# Patient Record
Sex: Male | Born: 1970
Health system: Southern US, Community
[De-identification: ages and names within clinical notes are randomized; demographics above are authoritative.]

## PROBLEM LIST (undated history)

## (undated) DIAGNOSIS — F329 Major depressive disorder, single episode, unspecified: Secondary | ICD-10-CM

## (undated) DIAGNOSIS — F4310 Post-traumatic stress disorder, unspecified: Secondary | ICD-10-CM

## (undated) DIAGNOSIS — H9313 Tinnitus, bilateral: Secondary | ICD-10-CM

## (undated) DIAGNOSIS — F419 Anxiety disorder, unspecified: Secondary | ICD-10-CM

## (undated) DIAGNOSIS — K219 Gastro-esophageal reflux disease without esophagitis: Secondary | ICD-10-CM

## (undated) DIAGNOSIS — I10 Essential (primary) hypertension: Secondary | ICD-10-CM

## (undated) DIAGNOSIS — G4730 Sleep apnea, unspecified: Secondary | ICD-10-CM

## (undated) DIAGNOSIS — R011 Cardiac murmur, unspecified: Secondary | ICD-10-CM

## (undated) DIAGNOSIS — Z8619 Personal history of other infectious and parasitic diseases: Secondary | ICD-10-CM

## (undated) DIAGNOSIS — F32A Depression, unspecified: Secondary | ICD-10-CM

## (undated) DIAGNOSIS — E785 Hyperlipidemia, unspecified: Secondary | ICD-10-CM

## (undated) HISTORY — DX: Sleep apnea, unspecified: G47.30

## (undated) HISTORY — DX: Major depressive disorder, single episode, unspecified: F32.9

## (undated) HISTORY — DX: Essential (primary) hypertension: I10

## (undated) HISTORY — PX: OTHER SURGICAL HISTORY: SHX169

## (undated) HISTORY — DX: Anxiety disorder, unspecified: F41.9

## (undated) HISTORY — DX: Personal history of other infectious and parasitic diseases: Z86.19

## (undated) HISTORY — DX: Tinnitus, bilateral: H93.13

## (undated) HISTORY — PX: HERNIA REPAIR: SHX51

## (undated) HISTORY — DX: Depression, unspecified: F32.A

## (undated) HISTORY — DX: Cardiac murmur, unspecified: R01.1

## (undated) HISTORY — DX: Gastro-esophageal reflux disease without esophagitis: K21.9

## (undated) HISTORY — DX: Hyperlipidemia, unspecified: E78.5

---

## 2007-05-17 LAB — CONVERTED CEMR LAB: Cholesterol: 266 mg/dL

## 2007-06-11 ENCOUNTER — Encounter: Payer: Self-pay | Admitting: Family Medicine

## 2007-06-13 ENCOUNTER — Ambulatory Visit: Payer: Self-pay | Admitting: Family Medicine

## 2007-06-13 DIAGNOSIS — E785 Hyperlipidemia, unspecified: Secondary | ICD-10-CM | POA: Insufficient documentation

## 2007-06-13 DIAGNOSIS — H9319 Tinnitus, unspecified ear: Secondary | ICD-10-CM | POA: Insufficient documentation

## 2007-06-13 DIAGNOSIS — I10 Essential (primary) hypertension: Secondary | ICD-10-CM | POA: Insufficient documentation

## 2007-06-13 LAB — CONVERTED CEMR LAB
Cholesterol, target level: 200 mg/dL
HDL goal, serum: 40 mg/dL
LDL Goal: 130 mg/dL

## 2008-06-25 ENCOUNTER — Encounter: Payer: Self-pay | Admitting: Family Medicine

## 2008-08-07 ENCOUNTER — Encounter: Payer: Self-pay | Admitting: Family Medicine

## 2008-08-25 ENCOUNTER — Encounter: Payer: Self-pay | Admitting: Family Medicine

## 2008-08-29 ENCOUNTER — Ambulatory Visit: Payer: Self-pay | Admitting: Family Medicine

## 2008-09-09 ENCOUNTER — Ambulatory Visit: Payer: Self-pay | Admitting: Family Medicine

## 2008-09-10 ENCOUNTER — Encounter: Payer: Self-pay | Admitting: Family Medicine

## 2008-09-15 LAB — CONVERTED CEMR LAB
HDL: 36 mg/dL — ABNORMAL LOW (ref >39)
LDL Cholesterol: 189 mg/dL — ABNORMAL HIGH (ref 0–99)
Total CHOL/HDL Ratio: 7.3
Triglycerides: 195 mg/dL — ABNORMAL HIGH (ref <150)
VLDL: 39 mg/dL (ref 0–40)

## 2008-10-20 ENCOUNTER — Telehealth: Payer: Self-pay | Admitting: Family Medicine

## 2008-11-24 ENCOUNTER — Telehealth: Payer: Self-pay | Admitting: Family Medicine

## 2008-12-01 ENCOUNTER — Ambulatory Visit: Payer: Self-pay | Admitting: Family Medicine

## 2008-12-01 DIAGNOSIS — R002 Palpitations: Secondary | ICD-10-CM | POA: Insufficient documentation

## 2008-12-01 DIAGNOSIS — R209 Unspecified disturbances of skin sensation: Secondary | ICD-10-CM | POA: Insufficient documentation

## 2008-12-02 LAB — CONVERTED CEMR LAB
Chloride: 101 meq/L (ref 96–112)
Creatinine, Ser: 1.13 mg/dL (ref 0.40–1.50)
HCT: 45.4 % (ref 39.0–52.0)
Platelets: 353 10*3/uL (ref 150–400)
Potassium: 4.2 meq/L (ref 3.5–5.3)
RDW: 13.3 % (ref 11.5–15.5)
Sodium: 139 meq/L (ref 135–145)

## 2008-12-03 ENCOUNTER — Telehealth: Payer: Self-pay | Admitting: Family Medicine

## 2008-12-31 ENCOUNTER — Telehealth (INDEPENDENT_AMBULATORY_CARE_PROVIDER_SITE_OTHER): Payer: Self-pay | Admitting: *Deleted

## 2009-01-12 ENCOUNTER — Ambulatory Visit: Payer: Self-pay | Admitting: Occupational Medicine

## 2009-01-12 ENCOUNTER — Telehealth (INDEPENDENT_AMBULATORY_CARE_PROVIDER_SITE_OTHER): Payer: Self-pay | Admitting: *Deleted

## 2009-01-12 DIAGNOSIS — M26629 Arthralgia of temporomandibular joint, unspecified side: Secondary | ICD-10-CM | POA: Insufficient documentation

## 2009-01-28 ENCOUNTER — Ambulatory Visit: Payer: Self-pay | Admitting: Family Medicine

## 2010-03-10 ENCOUNTER — Ambulatory Visit (HOSPITAL_COMMUNITY): Payer: Self-pay | Admitting: Psychology

## 2010-03-10 ENCOUNTER — Ambulatory Visit: Payer: Self-pay | Admitting: Family

## 2010-03-10 DIAGNOSIS — F341 Dysthymic disorder: Secondary | ICD-10-CM | POA: Insufficient documentation

## 2010-03-16 ENCOUNTER — Ambulatory Visit (HOSPITAL_COMMUNITY): Payer: Self-pay | Admitting: Psychology

## 2010-03-30 ENCOUNTER — Ambulatory Visit (HOSPITAL_COMMUNITY): Payer: Self-pay | Admitting: Psychology

## 2010-04-06 ENCOUNTER — Ambulatory Visit: Payer: Self-pay | Admitting: Family

## 2010-05-11 ENCOUNTER — Ambulatory Visit: Payer: Self-pay | Admitting: Family

## 2010-10-19 NOTE — Miscellaneous (Signed)
Summary: Controlled Substance Agreement  Controlled Substance Agreement   Imported By: Lanelle Bal 03/17/2010 08:33:04  _____________________________________________________________________  External Attachment:    Type:   Image     Comment:   External Document
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
## 2010-11-05 ENCOUNTER — Encounter: Payer: Self-pay | Admitting: Family Medicine

## 2010-11-05 ENCOUNTER — Ambulatory Visit (INDEPENDENT_AMBULATORY_CARE_PROVIDER_SITE_OTHER): Payer: 59 | Admitting: Family Medicine

## 2010-11-05 DIAGNOSIS — F341 Dysthymic disorder: Secondary | ICD-10-CM

## 2010-11-05 DIAGNOSIS — I10 Essential (primary) hypertension: Secondary | ICD-10-CM

## 2010-11-05 DIAGNOSIS — E785 Hyperlipidemia, unspecified: Secondary | ICD-10-CM

## 2010-11-05 DIAGNOSIS — K429 Umbilical hernia without obstruction or gangrene: Secondary | ICD-10-CM

## 2010-11-09 ENCOUNTER — Encounter: Payer: Self-pay | Admitting: Family Medicine
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
## 2010-12-03 ENCOUNTER — Encounter: Payer: Self-pay | Admitting: Family Medicine

## 2010-12-07 ENCOUNTER — Ambulatory Visit: Payer: 59 | Admitting: Family Medicine

## 2010-12-07 NOTE — Consult Note (Signed)
Summary: [facility]   Imported By: Maryln Gottron 11/30/2010 13:33:39  _____________________________________________________________________  External Attachment:    Type:   Image     Comment:   External Document

## 2010-12-09 ENCOUNTER — Telehealth: Payer: Self-pay | Admitting: Family Medicine

## 2010-12-17 ENCOUNTER — Other Ambulatory Visit (HOSPITAL_COMMUNITY): Payer: 59

## 2010-12-23 ENCOUNTER — Ambulatory Visit (HOSPITAL_COMMUNITY): Admission: RE | Admit: 2010-12-23 | Payer: 59 | Source: Ambulatory Visit | Admitting: Surgery

## 2010-12-31 ENCOUNTER — Encounter: Payer: Self-pay | Admitting: Family Medicine

## 2011-01-26 ENCOUNTER — Ambulatory Visit (HOSPITAL_COMMUNITY)
Admission: RE | Admit: 2011-01-26 | Discharge: 2011-01-26 | Disposition: A | Discharge status: Home / Self Care | Payer: 59 | Source: Ambulatory Visit | Attending: General Surgery | Admitting: General Surgery

## 2011-01-26 ENCOUNTER — Encounter (HOSPITAL_COMMUNITY)
Admission: RE | Admit: 2011-01-26 | Discharge: 2011-01-26 | Disposition: A | Discharge status: Home / Self Care | Payer: 59 | Source: Ambulatory Visit | Attending: General Surgery | Admitting: General Surgery

## 2011-01-26 ENCOUNTER — Other Ambulatory Visit (HOSPITAL_COMMUNITY): Payer: Self-pay | Admitting: General Surgery

## 2011-01-26 DIAGNOSIS — Z01818 Encounter for other preprocedural examination: Secondary | ICD-10-CM | POA: Insufficient documentation

## 2011-01-26 DIAGNOSIS — Z01810 Encounter for preprocedural cardiovascular examination: Secondary | ICD-10-CM | POA: Insufficient documentation

## 2011-01-26 DIAGNOSIS — K429 Umbilical hernia without obstruction or gangrene: Secondary | ICD-10-CM

## 2011-01-26 DIAGNOSIS — Z01812 Encounter for preprocedural laboratory examination: Secondary | ICD-10-CM | POA: Insufficient documentation

## 2011-01-26 DIAGNOSIS — I10 Essential (primary) hypertension: Secondary | ICD-10-CM | POA: Insufficient documentation

## 2011-01-26 LAB — DIFFERENTIAL
Basophils Absolute: 0.1 10*3/uL (ref 0.0–0.1)
Lymphocytes Relative: 35 % (ref 12–46)
Neutro Abs: 3.6 10*3/uL (ref 1.7–7.7)

## 2011-01-26 LAB — CBC
HCT: 44 % (ref 39.0–52.0)
Hemoglobin: 15 g/dL (ref 13.0–17.0)
RBC: 4.87 MIL/uL (ref 4.22–5.81)
WBC: 6.9 10*3/uL (ref 4.0–10.5)

## 2011-01-26 LAB — BASIC METABOLIC PANEL
BUN: 13 mg/dL (ref 6–23)
Calcium: 9.7 mg/dL (ref 8.4–10.5)
GFR calc non Af Amer: >60 mL/min (ref >60)
Glucose, Bld: 114 mg/dL — ABNORMAL HIGH (ref 70–99)
Sodium: 140 mEq/L (ref 135–145)

## 2011-01-31 ENCOUNTER — Observation Stay (HOSPITAL_COMMUNITY)
Admission: RE | Admit: 2011-01-31 | Discharge: 2011-02-02 | DRG: 355 | Disposition: A | Discharge status: Home / Self Care | Payer: 59 | Source: Ambulatory Visit | Attending: General Surgery | Admitting: General Surgery

## 2011-01-31 DIAGNOSIS — E669 Obesity, unspecified: Secondary | ICD-10-CM | POA: Insufficient documentation

## 2011-01-31 DIAGNOSIS — I10 Essential (primary) hypertension: Secondary | ICD-10-CM | POA: Insufficient documentation

## 2011-01-31 DIAGNOSIS — Z01818 Encounter for other preprocedural examination: Secondary | ICD-10-CM | POA: Insufficient documentation

## 2011-01-31 DIAGNOSIS — I517 Cardiomegaly: Secondary | ICD-10-CM | POA: Insufficient documentation

## 2011-01-31 DIAGNOSIS — K429 Umbilical hernia without obstruction or gangrene: Principal | ICD-10-CM | POA: Insufficient documentation

## 2011-01-31 DIAGNOSIS — Z01810 Encounter for preprocedural cardiovascular examination: Secondary | ICD-10-CM | POA: Insufficient documentation

## 2011-01-31 DIAGNOSIS — Z01812 Encounter for preprocedural laboratory examination: Secondary | ICD-10-CM | POA: Insufficient documentation

## 2011-02-02 NOTE — Op Note (Signed)
Aaron [name], CLINGER NO.:  [id number]  MEDICAL RECORD NO.:  [id number]           PATIENT TYPE:  O  LOCATION:  5127                         FACILITY:  [facility]  PHYSICIAN:  Adolph Pollack, M.D.DATE OF BIRTH:  [birth year]  DATE OF PROCEDURE:  01/31/2011 DATE OF DISCHARGE:                              OPERATIVE REPORT   PREOPERATIVE DIAGNOSIS:  Umbilical hernia.  POSTOPERATIVE DIAGNOSIS:  Umbilical hernia.  PROCEDURE:  Laparoscopic umbilical hernia repair with mesh.  SURGEON:  Adolph Pollack, MD  ANESTHESIA:  General.  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  He is obese.  Dr. Michaell Cowing saw him in the office and I saw him as a second opinion.  We both felt that a laparoscopic repair with mesh given his body size would have the best chance of long term good result.  He now presents for that.  Procedure, risks, and aftercare were discussed with him preoperatively.  TECHNIQUE:  He was seen in the holding area, then brought to the operating room, placed supine on the operating table and a general anesthetic was administered.  The hair on the abdominal wall was clipped.  The abdominal wall was widely sterilely prepped and draped.  He was placed in slight reverse Trendelenburg position.  In the left subcostal region local anesthetic consisting of Marcaine was infiltrated into the skin and subcutaneous tissues.  A 5-mm incision was then made and using a 5-mm OptiVu trocar I gained access to the peritoneal cavity and created pneumoperitoneum.  Inspection of the area under the trocar demonstrated no evidence of bleeding or organ injury.  Following this, I placed a 5-mm trocar in the left lower quadrant.  I noted the umbilical hernia with omentum up in it and with some external pressure I was able to reduce the omentum out of the hernia.  I  subsequently dissected the sac free and excised part of that as well.  I then placed an 11-mm trocar in right upper quadrant and a 5-mm trocar in the right lower quadrant.  I measured the periphery of the hernia using a spinal needle and then marked an area 4 cm away from the hernia.  This was approximately 12 x 12 cm area after it had been marked and brought a 12 x 12 cm area of Parietex composite mesh into the field.  Four anchoring sutures of #1 Novafil were placed at the 12, 3, 6, and 9 o'clock positions.  The mesh was then hydrated and inserted into the peritoneal cavity with the smooth side facing the viscera and the rough side facing the abdominal wall.  Stab incisions were made in 12, 3, 6 and 9 o'clock positions around the hernia and the anchoring sutures were pulled up across the fascial bridge and tied down anchoring the mesh to the abdominal wall. Using a spiral tacker, I then placed permanent tacks in the periphery of the mesh further anchoring it to the abdominal wall.  Following this, a four quadrant inspection was performed and there was  no evidence of bleeding or intestinal injury.  I released the CO2 gas and watched the omentum approximated the mesh, then removed the trocars.  All skin incisions were then closed with 4-0 Monocryl subcuticular stitches.  Steri-Strips and sterile dressings were applied.  He tolerated the procedure without any apparent complications and was taken to the recovery room in satisfactory condition.     Adolph Pollack, M.D.     Kari Baars  D:  01/31/2011  T:  01/31/2011  Job:  [id number]  cc:   Nani Gasser, M.D.  Electronically Signed by Avel Peace M.D. on 02/02/2011 07:56:38 AM

## 2011-02-15 NOTE — Discharge Summary (Signed)
  NAMECOWEN, PESQUEIRA NO.:  [id number]  MEDICAL RECORD NO.:  [id number]           PATIENT TYPE:  O  LOCATION:  5127                         FACILITY:  [facility]  PHYSICIAN:  Adolph Pollack, M.D.DATE OF BIRTH:  [birth year]  DATE OF ADMISSION:  01/31/2011 DATE OF DISCHARGE:  02/02/2011                              DISCHARGE SUMMARY   FINAL DIAGNOSIS:  Umbilical hernia.  SECONDARY DIAGNOSES: 1. Obesity. 2. Hypertension.  PROCEDURE:  Laparoscopic umbilical hernia repair with mesh.  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  He was admitted for a laparoscopic repair.  HOSPITAL COURSE:  He underwent laparoscopic repair of umbilical hernia, which contained omentum, and this was reduced.  Postoperatively, in the evening and first day, he had some significant peri-incisional pain and nausea.  I added some Toradol and his pain improved, his nausea improved.  By second postoperative day, he was ambulatory, tolerating a diet, voiding, and ready to be discharged.  Incision/dressings were clean and dry.  DISPOSITION:  Discharged home in satisfactory condition on Feb 02, 2011. He is given activity restrictions, Percocet for pain.  I told to take Advil for some breakthrough pain.  He will continue his home medications.  He will follow up in the office with me in 2-3 weeks.     Adolph Pollack, M.D.     Kari Baars  D:  02/02/2011  T:  02/02/2011  Job:  [id number]  Electronically Signed by Avel Peace M.D. on 02/15/2011 10:46:59 AM

## 2011-03-21 ENCOUNTER — Ambulatory Visit (INDEPENDENT_AMBULATORY_CARE_PROVIDER_SITE_OTHER): Payer: 59 | Admitting: General Surgery

## 2011-03-21 DIAGNOSIS — K429 Umbilical hernia without obstruction or gangrene: Secondary | ICD-10-CM

## 2011-03-21 NOTE — Patient Instructions (Signed)
Resume normal activities.  Return to work in one week.

## 2011-03-21 NOTE — Progress Notes (Signed)
He is here for her second postoperative visit following his laparoscopic repair of an umbilical hernia with mesh Jan 31, 2011.  He still has some soreness at the inferior aspect of the repair and by one the left sided incisions.  Physical exam-abdomen: Soft, obese, incisions clean dry and intact. Hernia repair solid.  Assessment: Status post laparoscopic umbilical hernia repair with mesh Jan 31, 2011. Doing well.  Plan: Start to resume normal activities. Return to work in one week. Return visit p.r.n.

## 2011-11-08 LAB — PULMONARY FUNCTION TEST

## 2011-11-14 ENCOUNTER — Other Ambulatory Visit: Payer: Self-pay | Admitting: *Deleted

## 2011-11-22 ENCOUNTER — Ambulatory Visit: Payer: 59 | Admitting: Family Medicine

## 2011-11-23 ENCOUNTER — Encounter: Payer: Self-pay | Admitting: Family

## 2011-11-23 ENCOUNTER — Ambulatory Visit (INDEPENDENT_AMBULATORY_CARE_PROVIDER_SITE_OTHER): Payer: 59 | Admitting: Family

## 2011-11-23 VITALS — BP 166/96 | HR 86 | Temp 97.8°F | Resp 16 | Wt 279.1 lb

## 2011-11-23 DIAGNOSIS — E782 Mixed hyperlipidemia: Secondary | ICD-10-CM | POA: Insufficient documentation

## 2011-11-23 DIAGNOSIS — R9431 Abnormal electrocardiogram [ECG] [EKG]: Secondary | ICD-10-CM

## 2011-11-23 DIAGNOSIS — R404 Transient alteration of awareness: Secondary | ICD-10-CM

## 2011-11-23 DIAGNOSIS — E669 Obesity, unspecified: Secondary | ICD-10-CM

## 2011-11-23 DIAGNOSIS — I10 Essential (primary) hypertension: Secondary | ICD-10-CM

## 2011-11-23 DIAGNOSIS — R400 Somnolence: Secondary | ICD-10-CM

## 2011-11-23 DIAGNOSIS — E785 Hyperlipidemia, unspecified: Secondary | ICD-10-CM

## 2011-11-23 MED ORDER — SIMVASTATIN 40 MG PO TABS: 40.0000 mg | ORAL_TABLET | Freq: Every day | ORAL | Status: DC

## 2011-11-23 MED ORDER — OLMESARTAN MEDOXOMIL-HCTZ 40-25 MG PO TABS: 1.0000 | ORAL_TABLET | Freq: Every day | ORAL | Status: DC

## 2011-11-23 NOTE — Assessment & Plan Note (Signed)
I suspect sleep apnea given complaints and habitus.  Recommended home sleep study and weight loss.
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
## 2011-11-23 NOTE — Assessment & Plan Note (Signed)
Cholesterol 264 LDL 177, resume statin.  Plan to check FLP and LFT's next visit.  Will also plan to check A1C at that time as his sugar was slightly elevated during his physical.

## 2011-11-23 NOTE — Patient Instructions (Signed)
Please work hard on diet exercise and weight loss.  Follow up in 1 month for appointment.   You will be contacted about your stress test and sleep study.

## 2011-11-23 NOTE — Assessment & Plan Note (Signed)
Deteriorated, resume benicar HCT.

## 2011-11-23 NOTE — Assessment & Plan Note (Signed)
Pt counseled on diet, exercise, weight loss.  Specifically, recommended that he discontinue red meat completely, no mcdonalds, no fried food, more fruits/veggies.

## 2011-11-25 ENCOUNTER — Telehealth: Payer: Self-pay | Admitting: *Deleted

## 2011-11-25 DIAGNOSIS — R9431 Abnormal electrocardiogram [ECG] [EKG]: Secondary | ICD-10-CM

## 2011-11-25 NOTE — Telephone Encounter (Signed)
Received call from pt with concerns about proceeding with stress test. Pt has received recent information re: catheterization vs. Stress testing. Pt is concerned if he has a blockage that he may have an MI during the stress test. Spoke with [name], FNP and advised pt that we will schedule him for a consultation to determine the best course of action. Pt requests to be referred to Dr Winifred Olive at [facility] [phone]. He states his parents see this cardiologist and wants to make sure we refer him as Aaron [name] as his dad is also a pt there.

## 2011-12-02 ENCOUNTER — Encounter: Payer: Self-pay | Admitting: Family

## 2011-12-13 ENCOUNTER — Encounter: Payer: 59 | Admitting: Physician Assistant

## 2011-12-20 ENCOUNTER — Telehealth: Payer: Self-pay | Admitting: *Deleted

## 2011-12-20 NOTE — Telephone Encounter (Signed)
Received call from pt stating he has had dizziness x 1 week and elevated BP. 150-170s systolic and does not remember diastolic readings. Pt denies any new symptoms. Per [name], NP pt should be evaluated in the office. Appt scheduled for tomorrow at 11:15 with Dr Rodena Medin.

## 2011-12-20 NOTE — Telephone Encounter (Signed)
Consultation note received from [facility] with Winifred Olive, MD (cardiologist) and forwarded to Provider for review.

## 2011-12-21 ENCOUNTER — Ambulatory Visit (INDEPENDENT_AMBULATORY_CARE_PROVIDER_SITE_OTHER): Payer: 59 | Admitting: Internal Medicine

## 2011-12-21 ENCOUNTER — Encounter: Payer: Self-pay | Admitting: Internal Medicine

## 2011-12-21 ENCOUNTER — Telehealth: Payer: Self-pay | Admitting: *Deleted

## 2011-12-21 VITALS — BP 136/90 | HR 79 | Temp 98.1°F | Resp 18 | Wt 280.0 lb

## 2011-12-21 DIAGNOSIS — R42 Dizziness and giddiness: Secondary | ICD-10-CM

## 2011-12-21 DIAGNOSIS — I10 Essential (primary) hypertension: Secondary | ICD-10-CM

## 2011-12-21 MED ORDER — MECLIZINE HCL 25 MG PO TABS: 25.0000 mg | ORAL_TABLET | Freq: Three times a day (TID) | ORAL | Status: AC | PRN

## 2011-12-21 MED ORDER — NEBIVOLOL HCL 5 MG PO TABS: 5.0000 mg | ORAL_TABLET | Freq: Every day | ORAL | Status: DC

## 2011-12-21 NOTE — Telephone Encounter (Signed)
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 Per Hughie Closs his recent office note states additional testing may be needed. Form faxed to [phone] and original given to pt.

## 2011-12-21 NOTE — Progress Notes (Signed)
  Subjective:    Patient ID: Aaron [name], male    DOB: 23-Feb-1971, 41 y.o.   MRN: [id number]  HPI Pt presents to clinic for evaluation of multiple medical problems. H/o HTN with progressively suboptimal control. Notes recent sbp range 150-170. Compliant with benicar hct without adverse effect. Notes one week h/o dizziness that typically occurs with head position change. No syncope. Has intermittent sensation of fullness of head and sometimes shooting pains in temples. No associated neurologic deficits. No recent illnesses or infection. Undergoing cardiac w/u at [facility] and was told stress echo nl.  Past Medical History  Diagnosis Date  . Hyperlipidemia   . Hypertension    Past Surgical History  Procedure Date  . Broken fingers     reports that he has quit smoking. He does not have any smokeless tobacco history on file. He reports that he drinks alcohol. He reports that he does not use illicit drugs. family history includes Cancer in his father and sister; Diabetes in his mother; Hyperlipidemia in his father and mother; Hypertension in his father and mother; and Other in his father. No Known Allergies   Review of Systems see hpi     Objective:   Physical Exam  Nursing note and vitals reviewed. Constitutional: He appears well-developed and well-nourished. No distress.  HENT:  Head: Normocephalic and atraumatic.  Right Ear: External ear and ear canal normal. A middle ear effusion is present.  Left Ear: Tympanic membrane, external ear and ear canal normal.  Nose: Nose normal.  Mouth/Throat: Oropharynx is clear and moist. No oropharyngeal exudate.  Eyes: Conjunctivae and EOM are normal. Pupils are equal, round, and reactive to light. No scleral icterus.  Neck: Neck supple. No JVD present. Carotid bruit is not present. No thyromegaly present.  Cardiovascular: Normal rate, regular rhythm and normal heart sounds.  Exam reveals no gallop and no friction rub.   No murmur  heard. Pulmonary/Chest: Effort normal and breath sounds normal. No respiratory distress. He has no wheezes. He has no rales.  Neurological: He is alert. No cranial nerve deficit. Coordination normal.  Skin: Skin is warm and dry. He is not diaphoretic.  Psychiatric: He has a normal mood and affect.          Assessment & Plan:

## 2011-12-21 NOTE — Assessment & Plan Note (Signed)
Vestibular etiology suspected. Reproduced in clinic by lateral head turning and associated with mild right ear effusion. Attempt antivert prn. Followup if no improvement or worsening.

## 2011-12-21 NOTE — Assessment & Plan Note (Signed)
suboptimal control. Add samples of bystolic 5mg  po qd. Monitor bp as outpt and keep close f/u appt in one week

## 2011-12-28 ENCOUNTER — Telehealth: Payer: Self-pay | Admitting: *Deleted

## 2011-12-28 ENCOUNTER — Ambulatory Visit: Payer: 59 | Admitting: Family

## 2011-12-28 NOTE — Telephone Encounter (Signed)
Patient returned phone call, and left voice message stating his blood pressure was 112/56, 107/62 in his left arm and 118/60 in his right arm. He has requested a return phone call. Call was returned to patient at [phone], no answer, a voice message was left informing patient call was returned.

## 2011-12-28 NOTE — Telephone Encounter (Signed)
Received voice message from pt stating he experienced some visual disturbance today after a training exercise at work. Reports that his distance vision was white and fading but could seed normal up close. States this is the 2nd episode that he has had. Pt wondering if this could be side effect of medication? Attempted to reach pt for additional details and left message to return my call. Pt has f/u scheduled for 12/30/11 at 9:45am.
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
## 2011-12-30 ENCOUNTER — Encounter: Payer: Self-pay | Admitting: Family

## 2011-12-30 ENCOUNTER — Ambulatory Visit (INDEPENDENT_AMBULATORY_CARE_PROVIDER_SITE_OTHER): Payer: 59 | Admitting: Family

## 2011-12-30 DIAGNOSIS — R739 Hyperglycemia, unspecified: Secondary | ICD-10-CM

## 2011-12-30 DIAGNOSIS — E669 Obesity, unspecified: Secondary | ICD-10-CM

## 2011-12-30 DIAGNOSIS — Z8669 Personal history of other diseases of the nervous system and sense organs: Secondary | ICD-10-CM

## 2011-12-30 DIAGNOSIS — R400 Somnolence: Secondary | ICD-10-CM

## 2011-12-30 DIAGNOSIS — R5381 Other malaise: Secondary | ICD-10-CM

## 2011-12-30 DIAGNOSIS — R5383 Other fatigue: Secondary | ICD-10-CM

## 2011-12-30 DIAGNOSIS — R7309 Other abnormal glucose: Secondary | ICD-10-CM

## 2011-12-30 DIAGNOSIS — E785 Hyperlipidemia, unspecified: Secondary | ICD-10-CM

## 2011-12-30 DIAGNOSIS — R42 Dizziness and giddiness: Secondary | ICD-10-CM

## 2011-12-30 DIAGNOSIS — R404 Transient alteration of awareness: Secondary | ICD-10-CM

## 2011-12-30 DIAGNOSIS — R9431 Abnormal electrocardiogram [ECG] [EKG]: Secondary | ICD-10-CM

## 2011-12-30 LAB — LIPID PANEL
Cholesterol: 200 mg/dL (ref 0–200)
Triglycerides: 185 mg/dL — ABNORMAL HIGH (ref <150)

## 2011-12-30 LAB — TSH: TSH: 1.384 u[IU]/mL (ref 0.350–4.500)
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
## 2011-12-30 NOTE — Patient Instructions (Signed)
Please complete your blood work prior to leaving. Follow up in 3 months, sooner if problems/concerns.  

## 2011-12-30 NOTE — Assessment & Plan Note (Signed)
Pt is fasting, obtain flp.  

## 2011-12-30 NOTE — Assessment & Plan Note (Signed)
ED thought that this was vasovagal response.  It occurred during extreme exertion and heat.  At this point, I think that we have ruled out any serious etiology.  I advised him to keep his upcoming eye examination and let us know if symptoms worsen.

## 2011-12-30 NOTE — Assessment & Plan Note (Signed)
?  mild vertigo, seems improved.  Continue prn meclizine. Monitor.

## 2011-12-30 NOTE — Assessment & Plan Note (Signed)
Suspect sleep apnea, await

## 2011-12-30 NOTE — Assessment & Plan Note (Signed)
Pt has undergone cardiac work which is reportedly normal.  We have requested records from cardiology.  He has been cleared by cards to return to full duty at work.

## 2011-12-30 NOTE — Assessment & Plan Note (Signed)
We discussed the importance of weight loss, diet and exercise. 

## 2012-01-02 ENCOUNTER — Telehealth: Payer: Self-pay | Admitting: Family

## 2012-01-02 DIAGNOSIS — R739 Hyperglycemia, unspecified: Secondary | ICD-10-CM

## 2012-01-02 DIAGNOSIS — E291 Testicular hypofunction: Secondary | ICD-10-CM

## 2012-01-02 LAB — TESTOSTERONE, FREE, TOTAL, SHBG
Testosterone, Free: 51 pg/mL (ref 47.0–244.0)
Testosterone-% Free: 2 % (ref 1.6–2.9)
Testosterone: 254.26 ng/dL — ABNORMAL LOW (ref 300–890)

## 2012-01-02 NOTE — Telephone Encounter (Signed)
Pt notified. Future lab order placed for tomorrow morning and copy forwarded to the lab.

## 2012-01-02 NOTE — Telephone Encounter (Signed)
Pls call pt and let him know that his testosterone is low.  I would like for him to return to the lab for the following tests:  FSH, LH, PSA, TSH, serum cortisol- hypogonadism. We will discuss treatment options after I see these additional tests.  Also, please let him know that his sugar was slightly elevated, but not in diabetic range.  He should work on diet, exercise, weight loss as we discussed. Cholesterol is much better than last time.

## 2012-01-03 ENCOUNTER — Telehealth: Payer: Self-pay

## 2012-01-03 NOTE — Telephone Encounter (Signed)
Called, spoke with pt who reports he is actually in , [facility], office now.  She is helping him with this.  Nothing further needed at this time.

## 2012-01-04 ENCOUNTER — Telehealth: Payer: Self-pay | Admitting: Family

## 2012-01-04 DIAGNOSIS — E291 Testicular hypofunction: Secondary | ICD-10-CM

## 2012-01-04 NOTE — Telephone Encounter (Signed)
Patient would like testosterone results.

## 2012-01-04 NOTE — Telephone Encounter (Signed)
Spoke with allison at [facility] lab and verified receipt of specimens from 01/03/12 and they have resulted. Requested that results be faxed to [place] as they were still showing as future.

## 2012-01-04 NOTE — Telephone Encounter (Signed)
Received results from [facility] and forwarded to Provider for review. Please advise.

## 2012-01-04 NOTE — Telephone Encounter (Signed)
Notified pt and he is agreeable to proceed with referral. 

## 2012-01-04 NOTE — Telephone Encounter (Signed)
Pls call pt and let him know that I have reviewed his additional laboratories- which include a TSH and a PSA which are normal.  I would like to refer him to urology for management of his low testosterone.  Referral pended below.

## 2012-01-05 ENCOUNTER — Telehealth: Payer: Self-pay | Admitting: *Deleted

## 2012-01-05 NOTE — Telephone Encounter (Signed)
Received call from Dr Ellin Goodie office stating they have arrange urology consult for May. They just received our fax but cannot read it. They are requesting that we mail information to them. Referral and labs mailed to [address] [place].

## 2012-01-06 ENCOUNTER — Ambulatory Visit (INDEPENDENT_AMBULATORY_CARE_PROVIDER_SITE_OTHER): Payer: 59 | Admitting: Pulmonary Disease

## 2012-01-06 DIAGNOSIS — G4733 Obstructive sleep apnea (adult) (pediatric): Secondary | ICD-10-CM

## 2012-01-06 DIAGNOSIS — R400 Somnolence: Secondary | ICD-10-CM

## 2012-01-09 ENCOUNTER — Telehealth: Payer: Self-pay | Admitting: Family

## 2012-01-09 DIAGNOSIS — G4730 Sleep apnea, unspecified: Secondary | ICD-10-CM

## 2012-01-09 NOTE — Telephone Encounter (Signed)
Pls call pt and let him know that I have reviewed his sleep study and it shows mild sleep apnea.  I would like for him to see Dr. Vassie Loll- pulmonology for further evaluation/recommendations. (pended below)

## 2012-01-09 NOTE — Telephone Encounter (Signed)
Notified pt and he is agreeable to proceed with referral. Advised pt he will be contacted in the next few days with appt date/time.

## 2012-01-13 ENCOUNTER — Encounter: Payer: Self-pay | Admitting: Family

## 2012-02-14 ENCOUNTER — Institutional Professional Consult (permissible substitution): Payer: 59 | Admitting: Pulmonary Disease

## 2012-02-28 ENCOUNTER — Other Ambulatory Visit: Payer: Self-pay | Admitting: Family

## 2012-02-29 NOTE — Telephone Encounter (Signed)
Rx refill sent to pharmacy. 

## 2012-03-08 ENCOUNTER — Encounter: Payer: Self-pay | Admitting: Internal Medicine

## 2012-03-08 ENCOUNTER — Ambulatory Visit (INDEPENDENT_AMBULATORY_CARE_PROVIDER_SITE_OTHER): Payer: 59 | Admitting: Internal Medicine

## 2012-03-08 VITALS — BP 136/80 | HR 91 | Temp 97.9°F | Resp 20

## 2012-03-08 DIAGNOSIS — H9209 Otalgia, unspecified ear: Secondary | ICD-10-CM

## 2012-03-08 MED ORDER — NEBIVOLOL HCL 5 MG PO TABS: 5.0000 mg | ORAL_TABLET | Freq: Every day | ORAL | Status: DC

## 2012-03-08 MED ORDER — SIMVASTATIN 40 MG PO TABS: 40.0000 mg | ORAL_TABLET | Freq: Every day | ORAL | Status: DC

## 2012-03-08 MED ORDER — FLUTICASONE PROPIONATE 50 MCG/ACT NA SUSP: 2.0000 | Freq: Every day | NASAL | Status: DC

## 2012-03-10 DIAGNOSIS — H9209 Otalgia, unspecified ear: Secondary | ICD-10-CM | POA: Insufficient documentation

## 2012-03-10 NOTE — Progress Notes (Signed)
  Subjective:    Patient ID: Aaron [name], male    DOB: 01-28-1971, 41 y.o.   MRN: [id number]  HPI Pt presents to clinic for evaluation of ear pain. Notes one month h/o left ear pain. Has sporadic stabbing shooting pain in ear. Sometimes pain radiates diagonally towards neck. No drainage, fever, chills, or change in hearing. No alleviating or exacerbating factors.   Past Medical History  Diagnosis Date  . Hyperlipidemia   . Hypertension    Past Surgical History  Procedure Date  . Broken fingers     reports that he has quit smoking. He has never used smokeless tobacco. He reports that he drinks alcohol. He reports that he does not use illicit drugs. family history includes Cancer in his father and sister; Diabetes in his mother; Hyperlipidemia in his father and mother; Hypertension in his father and mother; and Other in his father. No Known Allergies   Review of Systems see hpi     Objective:   Physical Exam  Nursing note and vitals reviewed. Constitutional: He appears well-developed and well-nourished. No distress.  HENT:  Head: Normocephalic and atraumatic.  Right Ear: Tympanic membrane, external ear and ear canal normal.  Left Ear: Tympanic membrane, external ear and ear canal normal.  Nose: Nose normal.  Mouth/Throat: Oropharynx is clear and moist. No oropharyngeal exudate.  Eyes: Conjunctivae are normal. No scleral icterus.  Neurological: He is alert.  Skin: He is not diaphoretic.  Psychiatric: He has a normal mood and affect.          Assessment & Plan:

## 2012-03-10 NOTE — Assessment & Plan Note (Signed)
No exam evidence of infection. Suspect eustacian tube etiology. Attempt short term otc decongestant while monitoring bp. Also begin flonase. Followup if no improvement or worsening.

## 2012-04-02 ENCOUNTER — Ambulatory Visit: Payer: 59 | Admitting: Family

## 2012-04-02 DIAGNOSIS — Z0289 Encounter for other administrative examinations: Secondary | ICD-10-CM

## 2012-12-20 ENCOUNTER — Ambulatory Visit: Payer: 59 | Admitting: Neurology

## 2016-06-22 ENCOUNTER — Telehealth: Payer: Self-pay | Admitting: Behavioral Health

## 2016-06-22 NOTE — Telephone Encounter (Signed)
Unable to reach patient at time of Pre-Visit Call.  Left message for patient to return call when available.    

## 2016-06-23 ENCOUNTER — Encounter: Payer: Self-pay | Admitting: Family Medicine

## 2016-06-23 ENCOUNTER — Ambulatory Visit (INDEPENDENT_AMBULATORY_CARE_PROVIDER_SITE_OTHER): Payer: 59 | Admitting: Family Medicine

## 2016-06-23 VITALS — BP 150/90 | HR 87 | Temp 97.6°F | Ht 72.0 in | Wt 285.4 lb

## 2016-06-23 DIAGNOSIS — Z114 Encounter for screening for human immunodeficiency virus [HIV]: Secondary | ICD-10-CM | POA: Diagnosis not present

## 2016-06-23 DIAGNOSIS — F4310 Post-traumatic stress disorder, unspecified: Secondary | ICD-10-CM | POA: Diagnosis not present

## 2016-06-23 DIAGNOSIS — Z131 Encounter for screening for diabetes mellitus: Secondary | ICD-10-CM

## 2016-06-23 DIAGNOSIS — N529 Male erectile dysfunction, unspecified: Secondary | ICD-10-CM | POA: Diagnosis not present

## 2016-06-23 DIAGNOSIS — E785 Hyperlipidemia, unspecified: Secondary | ICD-10-CM

## 2016-06-23 DIAGNOSIS — Z23 Encounter for immunization: Secondary | ICD-10-CM | POA: Diagnosis not present

## 2016-06-23 DIAGNOSIS — I10 Essential (primary) hypertension: Secondary | ICD-10-CM

## 2016-06-23 DIAGNOSIS — K219 Gastro-esophageal reflux disease without esophagitis: Secondary | ICD-10-CM

## 2016-06-23 DIAGNOSIS — E784 Other hyperlipidemia: Secondary | ICD-10-CM

## 2016-06-23 MED ORDER — PANTOPRAZOLE SODIUM 40 MG PO TBEC: 40.0000 mg | DELAYED_RELEASE_TABLET | Freq: Every day | ORAL | 1 refills | Status: DC

## 2016-06-23 MED ORDER — SERTRALINE HCL 50 MG PO TABS: 50.0000 mg | ORAL_TABLET | Freq: Every day | ORAL | 0 refills | Status: DC

## 2016-06-23 MED ORDER — SIMVASTATIN 40 MG PO TABS: 40.0000 mg | ORAL_TABLET | Freq: Every day | ORAL | 1 refills | Status: DC

## 2016-06-23 MED ORDER — OLMESARTAN MEDOXOMIL-HCTZ 40-25 MG PO TABS: 1.0000 | ORAL_TABLET | Freq: Every day | ORAL | 1 refills | Status: DC

## 2016-06-23 MED ORDER — NEBIVOLOL HCL 5 MG PO TABS: 5.0000 mg | ORAL_TABLET | Freq: Every day | ORAL | 1 refills | Status: DC

## 2016-06-23 NOTE — Addendum Note (Signed)
Addended by: Verdie ShireBAYNES, Ariele Vidrio M on: 06/23/2016 04:35 PM   Modules accepted: Orders

## 2016-06-23 NOTE — Progress Notes (Signed)
Pre visit review using our clinic review tool, if applicable. No additional management support is needed unless otherwise documented below in the visit note. 
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
## 2016-06-23 NOTE — Patient Instructions (Signed)
Please consider counseling. The medical literature and evidence-based guidelines support it. Contact [phone] to schedule an appointment or inquire about cost/insurance coverage.  

## 2016-06-24 ENCOUNTER — Ambulatory Visit: Payer: Self-pay | Admitting: Family Medicine

## 2016-06-24 ENCOUNTER — Other Ambulatory Visit: Payer: Self-pay | Admitting: Family Medicine

## 2016-06-24 ENCOUNTER — Other Ambulatory Visit (INDEPENDENT_AMBULATORY_CARE_PROVIDER_SITE_OTHER): Payer: 59

## 2016-06-24 DIAGNOSIS — N529 Male erectile dysfunction, unspecified: Secondary | ICD-10-CM

## 2016-06-24 DIAGNOSIS — I10 Essential (primary) hypertension: Secondary | ICD-10-CM

## 2016-06-24 DIAGNOSIS — E876 Hypokalemia: Secondary | ICD-10-CM

## 2016-06-24 DIAGNOSIS — Z114 Encounter for screening for human immunodeficiency virus [HIV]: Secondary | ICD-10-CM

## 2016-06-24 DIAGNOSIS — Z131 Encounter for screening for diabetes mellitus: Secondary | ICD-10-CM | POA: Diagnosis not present

## 2016-06-24 LAB — MICROALBUMIN / CREATININE URINE RATIO
CREATININE, U: 222.2 mg/dL
MICROALB UR: 2.9 mg/dL — AB (ref 0.0–1.9)
Microalb Creat Ratio: 1.3 mg/g (ref 0.0–30.0)

## 2016-06-24 LAB — COMPREHENSIVE METABOLIC PANEL
ALT: 28 U/L (ref 0–53)
AST: 18 U/L (ref 0–37)
Albumin: 4.2 g/dL (ref 3.5–5.2)
Alkaline Phosphatase: 56 U/L (ref 39–117)
BUN: 16 mg/dL (ref 6–23)
CHLORIDE: 100 meq/L (ref 96–112)
CO2: 28 meq/L (ref 19–32)
CREATININE: 1 mg/dL (ref 0.40–1.50)
Calcium: 9.5 mg/dL (ref 8.4–10.5)
GFR: 86.17 mL/min (ref >60.00)
Glucose, Bld: 116 mg/dL — ABNORMAL HIGH (ref 70–99)
Potassium: 3.4 mEq/L — ABNORMAL LOW (ref 3.5–5.1)
SODIUM: 138 meq/L (ref 135–145)
Total Bilirubin: 0.4 mg/dL (ref 0.2–1.2)
Total Protein: 7.9 g/dL (ref 6.0–8.3)

## 2016-06-24 LAB — TSH: TSH: 1.55 u[IU]/mL (ref 0.35–4.50)

## 2016-06-24 LAB — HIV ANTIBODY (ROUTINE TESTING W REFLEX): HIV 1&2 Ab, 4th Generation: NONREACTIVE

## 2016-06-24 LAB — HEMOGLOBIN A1C: Hgb A1c MFr Bld: 6.1 % (ref 4.6–6.5)

## 2016-06-28 ENCOUNTER — Other Ambulatory Visit (INDEPENDENT_AMBULATORY_CARE_PROVIDER_SITE_OTHER): Payer: 59

## 2016-06-28 ENCOUNTER — Telehealth: Payer: Self-pay | Admitting: Family Medicine

## 2016-06-28 DIAGNOSIS — E876 Hypokalemia: Secondary | ICD-10-CM | POA: Diagnosis not present

## 2016-06-28 DIAGNOSIS — Z0000 Encounter for general adult medical examination without abnormal findings: Secondary | ICD-10-CM

## 2016-06-28 LAB — BASIC METABOLIC PANEL
BUN: 15 mg/dL (ref 6–23)
CHLORIDE: 98 meq/L (ref 96–112)
CO2: 32 mEq/L (ref 19–32)
Calcium: 9.4 mg/dL (ref 8.4–10.5)
Creatinine, Ser: 1.01 mg/dL (ref 0.40–1.50)
GFR: 85.18 mL/min (ref >60.00)
Glucose, Bld: 122 mg/dL — ABNORMAL HIGH (ref 70–99)
POTASSIUM: 3.1 meq/L — AB (ref 3.5–5.1)
SODIUM: 138 meq/L (ref 135–145)

## 2016-06-28 LAB — HEMOGLOBIN A1C: HEMOGLOBIN A1C: 6.1 % (ref 4.6–6.5)

## 2016-06-28 NOTE — Telephone Encounter (Signed)
°  Relation to ZO:XWRUpt:self Call back number:[phone] Pharmacy:[facility] Reason for call: pt states that the wrong bp rx nebivolol (BYSTOLIC) 5 MG tablet  was sent to the pharmacy, pt states he is on olmesartan-hydrochlorothiazide (BENICAR HCT) 40-25 MG tablet and he did not realize it was the wrong ones until his wife had already picked them up. Please advise

## 2016-06-28 NOTE — Telephone Encounter (Signed)
I have reached out to patient to confirm which medication he has received from pharamacy. Pt states he was out and not around his medications and will return call to me once he gets to them.  I also reached out to pharmacy to verify which medications patient did receive. Pharmacy stated pt was given both BP meds (Nebivolol and Benicar "but probably was confused due to the generic name"). Pt also received Sertraline and Simvastatin but did not receive Protonix. When patient calls back I will inform him of this and let him know Protonix is only $10 cost to him.

## 2016-06-28 NOTE — Telephone Encounter (Signed)
LVMOM for patient to return my call into the office. TL/CMA 06/28/16

## 2016-06-29 ENCOUNTER — Other Ambulatory Visit: Payer: Self-pay | Admitting: Family Medicine

## 2016-06-29 ENCOUNTER — Other Ambulatory Visit (INDEPENDENT_AMBULATORY_CARE_PROVIDER_SITE_OTHER): Payer: 59

## 2016-06-29 DIAGNOSIS — E291 Testicular hypofunction: Secondary | ICD-10-CM

## 2016-06-29 DIAGNOSIS — E876 Hypokalemia: Secondary | ICD-10-CM

## 2016-06-29 LAB — TESTOSTERONE: TESTOSTERONE: 274.01 ng/dL — AB (ref 300.00–890.00)

## 2016-06-29 MED ORDER — POTASSIUM CHLORIDE CRYS ER 10 MEQ PO TBCR: 20.0000 meq | EXTENDED_RELEASE_TABLET | Freq: Two times a day (BID) | ORAL | 0 refills | Status: DC

## 2016-06-29 NOTE — Telephone Encounter (Signed)
Pt called back in. He says that he discussed erectile dysfunction in his visit. He would like to know if there is something that he could take (prescribed) that would help.    Pt would like a call back to discuss further.

## 2016-06-30 NOTE — Telephone Encounter (Signed)
LVMOM for pt to return call into the office.

## 2016-07-01 ENCOUNTER — Encounter: Payer: Self-pay | Admitting: Family Medicine

## 2016-07-05 ENCOUNTER — Encounter: Payer: Self-pay | Admitting: Family Medicine

## 2016-07-11 ENCOUNTER — Other Ambulatory Visit (INDEPENDENT_AMBULATORY_CARE_PROVIDER_SITE_OTHER): Payer: 59

## 2016-07-11 DIAGNOSIS — E876 Hypokalemia: Secondary | ICD-10-CM

## 2016-07-11 LAB — BASIC METABOLIC PANEL
BUN: 21 mg/dL (ref 6–23)
CHLORIDE: 101 meq/L (ref 96–112)
CO2: 29 mEq/L (ref 19–32)
CREATININE: 1 mg/dL (ref 0.40–1.50)
Calcium: 9.4 mg/dL (ref 8.4–10.5)
GFR: 86.15 mL/min (ref >60.00)
GLUCOSE: 122 mg/dL — AB (ref 70–99)
POTASSIUM: 3.1 meq/L — AB (ref 3.5–5.1)
Sodium: 139 mEq/L (ref 135–145)

## 2016-07-14 ENCOUNTER — Encounter: Payer: Self-pay | Admitting: Family Medicine

## 2016-07-14 ENCOUNTER — Ambulatory Visit (INDEPENDENT_AMBULATORY_CARE_PROVIDER_SITE_OTHER): Payer: 59 | Admitting: Family Medicine

## 2016-07-14 ENCOUNTER — Other Ambulatory Visit: Payer: Self-pay | Admitting: Family Medicine

## 2016-07-14 VITALS — BP 140/80 | HR 86 | Temp 97.7°F | Ht 72.0 in | Wt 293.2 lb

## 2016-07-14 DIAGNOSIS — E291 Testicular hypofunction: Secondary | ICD-10-CM | POA: Diagnosis not present

## 2016-07-14 DIAGNOSIS — M545 Low back pain: Secondary | ICD-10-CM | POA: Diagnosis not present

## 2016-07-14 DIAGNOSIS — G8929 Other chronic pain: Secondary | ICD-10-CM

## 2016-07-14 DIAGNOSIS — E876 Hypokalemia: Secondary | ICD-10-CM

## 2016-07-14 LAB — PSA: PSA: 0.86 ng/mL (ref 0.10–4.00)

## 2016-07-14 LAB — BASIC METABOLIC PANEL
BUN: 18 mg/dL (ref 6–23)
CALCIUM: 9.4 mg/dL (ref 8.4–10.5)
CO2: 25 meq/L (ref 19–32)
CREATININE: 0.98 mg/dL (ref 0.40–1.50)
Chloride: 104 mEq/L (ref 96–112)
GFR: 88.18 mL/min (ref >60.00)
Glucose, Bld: 139 mg/dL — ABNORMAL HIGH (ref 70–99)
Potassium: 3.9 mEq/L (ref 3.5–5.1)
Sodium: 141 mEq/L (ref 135–145)

## 2016-07-14 LAB — LUTEINIZING HORMONE: LH: 6.71 m[IU]/mL (ref 1.50–9.30)

## 2016-07-14 LAB — MAGNESIUM: Magnesium: 2 mg/dL (ref 1.5–2.5)

## 2016-07-14 MED ORDER — TESTOSTERONE CYPIONATE 100 MG/ML IM SOLN: 100.0000 mg | INTRAMUSCULAR | 0 refills | Status: DC

## 2016-07-14 MED ORDER — DIAZEPAM 5 MG PO TABS: 5.0000 mg | ORAL_TABLET | Freq: Once | ORAL | 0 refills | Status: DC | PRN

## 2016-07-14 NOTE — Progress Notes (Signed)
Faxed to pharmacy 1133am. TL/CMA

## 2016-07-14 NOTE — Patient Instructions (Addendum)
Please consider counseling. The medical literature and evidence-based guidelines support it. Contact [phone] to schedule an appointment or inquire about cost/insurance coverage.  EXERCISES  RANGE OF MOTION (ROM) AND STRETCHING EXERCISES - Low Back Sprain Most people with lower back pain will find that their symptoms get worse with excessive bending forward (flexion) or arching at the lower back (extension). The exercises that will help resolve your symptoms will focus on the opposite motion.  Your physician, physical therapist or athletic trainer will help you determine which exercises will be most helpful to resolve your lower back pain. Do not complete any exercises without first consulting with your caregiver. Discontinue any exercises which make your symptoms worse, until you speak to your caregiver. If you have pain, numbness or tingling which travels down into your buttocks, leg or foot, the goal of the therapy is for these symptoms to move closer to your back and eventually resolve. Sometimes, these leg symptoms will get better, but your lower back pain may worsen. This is often an indication of progress in your rehabilitation. Be very alert to any changes in your symptoms and the activities in which you participated in the 24 hours prior to the change. Sharing this information with your caregiver will allow him or her to most efficiently treat your condition. These exercises may help you when beginning to rehabilitate your injury. Your symptoms may resolve with or without further involvement from your physician, physical therapist or athletic trainer. While completing these exercises, remember:   Restoring tissue flexibility helps normal motion to return to the joints. This allows healthier, less painful movement and activity.  An effective stretch should be held for at least 30 seconds.  A stretch should never be painful. You should only feel a gentle lengthening or release in the  stretched tissue. FLEXION RANGE OF MOTION AND STRETCHING EXERCISES:  STRETCH - Flexion, Single Knee to Chest   Lie on a firm bed or floor with both legs extended in front of you.  Keeping one leg in contact with the floor, bring your opposite knee to your chest. Hold your leg in place by either grabbing behind your thigh or at your knee.  Pull until you feel a gentle stretch in your low back. Hold 15-20 seconds.  Slowly release your grasp and repeat the exercise with the opposite side. Repeat 2 times. Complete this exercise 1-2 times per day.   STRETCH - Flexion, Double Knee to Chest  Lie on a firm bed or floor with both legs extended in front of you.  Keeping one leg in contact with the floor, bring your opposite knee to your chest.  Tense your stomach muscles to support your back and then lift your other knee to your chest. Hold your legs in place by either grabbing behind your thighs or at your knees.  Pull both knees toward your chest until you feel a gentle stretch in your low back. Hold 15-20 seconds.  Tense your stomach muscles and slowly return one leg at a time to the floor. Repeat 2 times. Complete this exercise 1-2 times per day.   STRETCH - Low Trunk Rotation  Lie on a firm bed or floor. Keeping your legs in front of you, bend your knees so they are both pointed toward the ceiling and your feet are flat on the floor.  Extend your arms out to the side. This will stabilize your upper body by keeping your shoulders in contact with the floor.  Gently and slowly drop   both knees together to one side until you feel a gentle stretch in your low back. Hold for 15-20 seconds.  Tense your stomach muscles to support your lower back as you bring your knees back to the starting position. Repeat the exercise to the other side. Repeat 2 times. Complete this exercise 1-2 times per day  EXTENSION RANGE OF MOTION AND FLEXIBILITY EXERCISES:  STRETCH - Extension, Prone on Elbows   Lie  on your stomach on the floor, a bed will be too soft. Place your palms about shoulder width apart and at the height of your head.  Place your elbows under your shoulders. If this is too painful, stack pillows under your chest.  Allow your body to relax so that your hips drop lower and make contact more completely with the floor.  Hold this position for 15-20 seconds.  Slowly return to lying flat on the floor. Repeat 2 times. Complete this exercise 1-2 times per day.   RANGE OF MOTION - Extension, Prone Press Ups  Lie on your stomach on the floor, a bed will be too soft. Place your palms about shoulder width apart and at the height of your head.  Keeping your back as relaxed as possible, slowly straighten your elbows while keeping your hips on the floor. You may adjust the placement of your hands to maximize your comfort. As you gain motion, your hands will come more underneath your shoulders.  Hold this position 15-20 seconds.  Slowly return to lying flat on the floor. Repeat 2 times. Complete this exercise 1-2 times per day.   RANGE OF MOTION- Quadruped, Neutral Spine   Assume a hands and knees position on a firm surface. Keep your hands under your shoulders and your knees under your hips. You may place padding under your knees for comfort.  Drop your head and point your tailbone toward the ground below you. This will round out your lower back like an angry cat. Hold this position for 15-20 seconds.  Slowly lift your head and release your tail bone so that your back sags into a large arch, like an old horse.  Hold this position for 15-20 seconds.  Repeat this until you feel limber in your low back.  Now, find your "sweet spot." This will be the most comfortable position somewhere between the two previous positions. This is your neutral spine. Once you have found this position, tense your stomach muscles to support your low back.  Hold this position for 15-20 seconds. Repeat 2  times. Complete this exercise 1-2 times per day.  STRENGTHENING EXERCISES - Low Back Sprain These exercises may help you when beginning to rehabilitate your injury. These exercises should be done near your "sweet spot." This is the neutral, low-back arch, somewhere between fully rounded and fully arched, that is your least painful position. When performed in this safe range of motion, these exercises can be used for people who have either a flexion or extension based injury. These exercises may resolve your symptoms with or without further involvement from your physician, physical therapist or athletic trainer. While completing these exercises, remember:   Muscles can gain both the endurance and the strength needed for everyday activities through controlled exercises.  Complete these exercises as instructed by your physician, physical therapist or athletic trainer. Increase the resistance and repetitions only as guided.  You may experience muscle soreness or fatigue, but the pain or discomfort you are trying to eliminate should never worsen during these exercises. If this   pain does worsen, stop and make certain you are following the directions exactly. If the pain is still present after adjustments, discontinue the exercise until you can discuss the trouble with your caregiver.  STRENGTHENING - Deep Abdominals, Pelvic Tilt   Lie on a firm bed or floor. Keeping your legs in front of you, bend your knees so they are both pointed toward the ceiling and your feet are flat on the floor.  Tense your lower abdominal muscles to press your low back into the floor. This motion will rotate your pelvis so that your tail bone is scooping upwards rather than pointing at your feet or into the floor. With a gentle tension and even breathing, hold this position for 10-15 seconds. Repeat 2 times. Complete this exercise 1 time per day.   STRENGTHENING - Abdominals, Crunches   Lie on a firm bed or floor. Keeping  your legs in front of you, bend your knees so they are both pointed toward the ceiling and your feet are flat on the floor. Cross your arms over your chest.  Slightly tip your chin down without bending your neck.  Tense your abdominals and slowly lift your trunk high enough to just clear your shoulder blades. Lifting higher can put excessive stress on the lower back and does not further strengthen your abdominal muscles.  Control your return to the starting position. Repeat 2 times. Complete this exercise once every 1-2 days.   STRENGTHENING - Quadruped, Opposite UE/LE Lift   Assume a hands and knees position on a firm surface. Keep your hands under your shoulders and your knees under your hips. You may place padding under your knees for comfort.  Find your neutral spine and gently tense your abdominal muscles so that you can maintain this position. Your shoulders and hips should form a rectangle that is parallel with the floor and is not twisted.  Keeping your trunk steady, lift your right hand no higher than your shoulder and then your left leg no higher than your hip. Make sure you are not holding your breath. Hold this position for 15-20 seconds.  Continuing to keep your abdominal muscles tense and your back steady, slowly return to your starting position. Repeat with the opposite arm and leg. Repeat 2 times. Complete this exercise once every 1-2 days.   STRENGTHENING - Abdominals and Quadriceps, Straight Leg Raise   Lie on a firm bed or floor with both legs extended in front of you.  Keeping one leg in contact with the floor, bend the other knee so that your foot can rest flat on the floor.  Find your neutral spine, and tense your abdominal muscles to maintain your spinal position throughout the exercise.  Slowly lift your straight leg off the floor about 6 inches for a count of 15, making sure to not hold your breath.  Still keeping your neutral spine, slowly lower your leg all  the way to the floor. Repeat this exercise with each leg 2 times. Complete this exercise once every 1-2 days. POSTURE AND BODY MECHANICS CONSIDERATIONS - Low Back Sprain Keeping correct posture when sitting, standing or completing your activities will reduce the stress put on different body tissues, allowing injured tissues a chance to heal and limiting painful experiences. The following are general guidelines for improved posture. Your physician or physical therapist will provide you with any instructions specific to your needs. While reading these guidelines, remember:  The exercises prescribed by your provider will help you have the   flexibility and strength to maintain correct postures.  The correct posture provides the best environment for your joints to work. All of your joints have less wear and tear when properly supported by a spine with good posture. This means you will experience a healthier, less painful body.  Correct posture must be practiced with all of your activities, especially prolonged sitting and standing. Correct posture is as important when doing repetitive low-stress activities (typing) as it is when doing a single heavy-load activity (lifting).  RESTING POSITIONS Consider which positions are most painful for you when choosing a resting position. If you have pain with flexion-based activities (sitting, bending, stooping, squatting), choose a position that allows you to rest in a less flexed posture. You would want to avoid curling into a fetal position on your side. If your pain worsens with extension-based activities (prolonged standing, working overhead), avoid resting in an extended position such as sleeping on your stomach. Most people will find more comfort when they rest with their spine in a more neutral position, neither too rounded nor too arched. Lying on a non-sagging bed on your side with a pillow between your knees, or on your back with a pillow under your knees will  often provide some relief. Keep in mind, being in any one position for a prolonged period of time, no matter how correct your posture, can still lead to stiffness. PROPER SITTING POSTURE In order to minimize stress and discomfort on your spine, you must sit with correct posture. Sitting with good posture should be effortless for a healthy body. Returning to good posture is a gradual process. Many people can work toward this most comfortably by using various supports until they have the flexibility and strength to maintain this posture on their own. When sitting with proper posture, your ears will fall over your shoulders and your shoulders will fall over your hips. You should use the back of the chair to support your upper back. Your lower back will be in a neutral position, just slightly arched. You may place a small pillow or folded towel at the base of your lower back for  support.  When working at a desk, create an environment that supports good, upright posture. Without extra support, muscles tire, which leads to excessive strain on joints and other tissues. Keep these recommendations in mind:  CHAIR:  A chair should be able to slide under your desk when your back makes contact with the back of the chair. This allows you to work closely.  The chair's height should allow your eyes to be level with the upper part of your monitor and your hands to be slightly lower than your elbows.  BODY POSITION  Your feet should make contact with the floor. If this is not possible, use a foot rest.  Keep your ears over your shoulders. This will reduce stress on your neck and low back.  INCORRECT SITTING POSTURES  If you are feeling tired and unable to assume a healthy sitting posture, do not slouch or slump. This puts excessive strain on your back tissues, causing more damage and pain. Healthier options include:  Using more support, like a lumbar pillow.  Switching tasks to something that requires you to  be upright or walking.  Talking a brief walk.  Lying down to rest in a neutral-spine position.  PROLONGED STANDING WHILE SLIGHTLY LEANING FORWARD  When completing a task that requires you to lean forward while standing in one place for a long time, place   either foot up on a stationary 2-4 inch high object to help maintain the best posture. When both feet are on the ground, the lower back tends to lose its slight inward curve. If this curve flattens (or becomes too large), then the back and your other joints will experience too much stress, tire more quickly, and can cause pain.  CORRECT STANDING POSTURES Proper standing posture should be assumed with all daily activities, even if they only take a few moments, like when brushing your teeth. As in sitting, your ears should fall over your shoulders and your shoulders should fall over your hips. You should keep a slight tension in your abdominal muscles to brace your spine. Your tailbone should point down to the ground, not behind your body, resulting in an over-extended swayback posture.   INCORRECT STANDING POSTURES  Common incorrect standing postures include a forward head, locked knees and/or an excessive swayback. WALKING Walk with an upright posture. Your ears, shoulders and hips should all line-up.  PROLONGED ACTIVITY IN A FLEXED POSITION When completing a task that requires you to bend forward at your waist or lean over a low surface, try to find a way to stabilize 3 out of 4 of your limbs. You can place a hand or elbow on your thigh or rest a knee on the surface you are reaching across. This will provide you more stability, so that your muscles do not tire as quickly. By keeping your knees relaxed, or slightly bent, you will also reduce stress across your lower back. CORRECT LIFTING TECHNIQUES  DO :  Assume a wide stance. This will provide you more stability and the opportunity to get as close as possible to the object which you are  lifting.  Tense your abdominals to brace your spine. Bend at the knees and hips. Keeping your back locked in a neutral-spine position, lift using your leg muscles. Lift with your legs, keeping your back straight.  Test the weight of unknown objects before attempting to lift them.  Try to keep your elbows locked down at your sides in order get the best strength from your shoulders when carrying an object.     Always ask for help when lifting heavy or awkward objects. INCORRECT LIFTING TECHNIQUES DO NOT:   Lock your knees when lifting, even if it is a small object.  Bend and twist. Pivot at your feet or move your feet when needing to change directions.  Assume that you can safely pick up even a paperclip without proper posture.     

## 2016-07-14 NOTE — Progress Notes (Signed)
Chief Complaint  Patient presents with  . Follow-up    on [place] level    Subjective: Patient is a 45 y.o. male here for f/u low testosterone.  +hx of this. Not on tx. Rechecked and still low. Used to be on topical gel, did not like it for fear of it getting on his wife. He is having erectile dysfunction and it is affecting his relationship. He was never told why he had low testosterone in the past.   Also mentions low back pain for several years after pulling a 185 lb manikin. He is having some numbness in his L thigh and weakness as well. No loss of bowel/bladder function. He has been seeing a chiropractor who has been doing adjustments and obtained XR's. He was told he has a pinched nerve by this provider.   ROS: MSK: +LBP Neuro: +weakness in L thigh, +numbness over L thigh  Family History  Problem Relation Age of Onset  . Diabetes Mother   . Hyperlipidemia Mother   . Hypertension Mother   . Heart disease Mother   . Cancer Father   . Hyperlipidemia Father   . Hypertension Father   . Other Father     kidney removal  . Heart disease Father   . Kidney disease Father   . Cancer Sister   . Hypertension Sister    Past Medical History:  Diagnosis Date  . Anxiety   . GERD (gastroesophageal reflux disease)   . History of chicken pox   . Hyperlipidemia   . Hypertension   . Tinnitus of both ears    No Known Allergies  Current Outpatient Prescriptions:  .  Ginkgo Biloba Complex 440 MG CAPS, Take 1 capsule by mouth daily., Disp: , Rfl:  .  Multiple Vitamin (MULTIVITAMIN) tablet, Take 1 tablet by mouth daily., Disp: , Rfl:  .  nebivolol (BYSTOLIC) 5 MG tablet, Take 1 tablet (5 mg total) by mouth daily., Disp: 90 tablet, Rfl: 1 .  olmesartan-hydrochlorothiazide (BENICAR HCT) 40-25 MG tablet, Take 1 tablet by mouth daily., Disp: 90 tablet, Rfl: 1 .  pantoprazole (PROTONIX) 40 MG tablet, Take 1 tablet (40 mg total) by mouth daily., Disp: 90 tablet, Rfl: 1 .  potassium chloride  (K-DUR,KLOR-CON) 10 MEQ tablet, Take 2 tablets (20 mEq total) by mouth 2 (two) times daily., Disp: 14 tablet, Rfl: 0 .  sertraline (ZOLOFT) 50 MG tablet, Take 1 tablet (50 mg total) by mouth daily. Take 1/2 a tab daily for the first 2 weeks., Disp: 60 tablet, Rfl: 0 .  simvastatin (ZOCOR) 40 MG tablet, Take 1 tablet (40 mg total) by mouth at bedtime., Disp: 90 tablet, Rfl: 1 .  testosterone cypionate (DEPOTESTOTERONE CYPIONATE) 100 MG/ML injection, Inject 1 mL (100 mg total) into the muscle every 7 (seven) days. For IM use only, Disp: 10 mL, Rfl: 0  Objective: BP 140/80 (BP Location: Left Arm, Patient Position: Sitting, Cuff Size: Large)   Pulse 86   Temp 97.7 F (36.5 C) (Oral)   Ht 6' (1.829 m)   Wt 293 lb 3.2 oz (133 kg)   SpO2 98%   BMI 39.77 kg/m  General: Awake, appears stated age HEENT: MMM, EOMi Heart: RRR, no murmurs, no LE edema, no bruits Lungs: CTAB, no rales, wheezes or rhonchi. No accessory muscle use MSK: TTP over the midline lumbar spine and sacrum. There is notable restriction in his hamstring flexibility b/l. 5/5 strength throughout LE's b/l with the exception of L knee flexion, 4/5.  Neuro:  patellar DTR 1/4 b/l, calcaneal reflex 1/4 b/l wo clonus, neg straight leg and lesegue's Psych: Age appropriate judgment and insight, normal affect and mood  Assessment and Plan: Hypogonadism in male - Plan: LH, FSH, PSA, testosterone cypionate (DEPOTESTOTERONE CYPIONATE) 100 MG/ML injection  Hypokalemia - Plan: Basic Metabolic Panel (BMET), Magnesium  Chronic midline low back pain without sciatica - Plan: MR Lumbar Spine Wo Contrast  Orders as above. Increase exercise and improve diet. Stretches and exercises provided. Given weakness and neurologic signs on L side, will obtain MRI to r/o nerve impingement. If nml, will consider physical therapy vs SI joint injections. He is following up soon for PTSD, will recheck BP there. The patient voiced understanding and agreement to  the plan.  Jilda Rocheicholas Paul [place], DO 07/14/16  7:41 AM

## 2016-07-14 NOTE — Progress Notes (Signed)
Pre visit review using our clinic review tool, if applicable. No additional management support is needed unless otherwise documented below in the visit note. 

## 2016-07-14 NOTE — Progress Notes (Signed)
Rx printed and signed for anxiety for MRI. Please fax/call in to pharmacy. TY.

## 2016-07-16 ENCOUNTER — Ambulatory Visit (HOSPITAL_BASED_OUTPATIENT_CLINIC_OR_DEPARTMENT_OTHER): Payer: 59

## 2016-07-18 LAB — FOLLICLE STIMULATING HORMONE: FSH: 6.4 m[IU]/mL (ref 1.4–18.1)

## 2016-07-21 ENCOUNTER — Other Ambulatory Visit: Payer: Self-pay | Admitting: Family Medicine

## 2016-07-21 ENCOUNTER — Ambulatory Visit (HOSPITAL_BASED_OUTPATIENT_CLINIC_OR_DEPARTMENT_OTHER)
Admission: RE | Admit: 2016-07-21 | Discharge: 2016-07-21 | Disposition: A | Discharge status: Home / Self Care | Payer: 59 | Source: Ambulatory Visit | Attending: Family Medicine | Admitting: Family Medicine

## 2016-07-21 DIAGNOSIS — M545 Low back pain: Secondary | ICD-10-CM

## 2016-07-21 DIAGNOSIS — G8929 Other chronic pain: Secondary | ICD-10-CM

## 2016-07-21 DIAGNOSIS — Z1389 Encounter for screening for other disorder: Secondary | ICD-10-CM

## 2016-07-21 DIAGNOSIS — F4310 Post-traumatic stress disorder, unspecified: Secondary | ICD-10-CM

## 2016-07-21 DIAGNOSIS — Z0189 Encounter for other specified special examinations: Secondary | ICD-10-CM

## 2016-07-21 NOTE — Telephone Encounter (Signed)
Pt was last seen 07/14/2016. Refill rx for Sertraline #50 tablets 0 refills. TL/CMA

## 2016-07-22 ENCOUNTER — Telehealth: Payer: Self-pay | Admitting: Family

## 2016-07-22 MED ORDER — DIAZEPAM 5 MG PO TABS: 5.0000 mg | ORAL_TABLET | Freq: Once | ORAL | 0 refills | Status: DC | PRN

## 2016-07-22 NOTE — Telephone Encounter (Signed)
Refill called in to pharmacy- patient notified.

## 2016-07-23 ENCOUNTER — Ambulatory Visit (HOSPITAL_BASED_OUTPATIENT_CLINIC_OR_DEPARTMENT_OTHER): Payer: 59

## 2016-07-23 ENCOUNTER — Encounter (HOSPITAL_BASED_OUTPATIENT_CLINIC_OR_DEPARTMENT_OTHER): Payer: Self-pay

## 2016-07-30 ENCOUNTER — Ambulatory Visit (HOSPITAL_BASED_OUTPATIENT_CLINIC_OR_DEPARTMENT_OTHER): Admission: RE | Admit: 2016-07-30 | Payer: 59 | Source: Ambulatory Visit

## 2016-08-13 ENCOUNTER — Ambulatory Visit (HOSPITAL_BASED_OUTPATIENT_CLINIC_OR_DEPARTMENT_OTHER): Payer: 59

## 2016-08-13 ENCOUNTER — Encounter (HOSPITAL_BASED_OUTPATIENT_CLINIC_OR_DEPARTMENT_OTHER): Payer: Self-pay

## 2016-09-01 ENCOUNTER — Ambulatory Visit (INDEPENDENT_AMBULATORY_CARE_PROVIDER_SITE_OTHER): Payer: 59 | Admitting: Family Medicine

## 2016-09-01 ENCOUNTER — Encounter: Payer: Self-pay | Admitting: Family Medicine

## 2016-09-01 ENCOUNTER — Telehealth: Payer: Self-pay | Admitting: Family Medicine

## 2016-09-01 VITALS — BP 140/70 | HR 95 | Temp 97.7°F | Ht 72.0 in | Wt 299.2 lb

## 2016-09-01 DIAGNOSIS — F4310 Post-traumatic stress disorder, unspecified: Secondary | ICD-10-CM

## 2016-09-01 DIAGNOSIS — Z9119 Patient's noncompliance with other medical treatment and regimen: Secondary | ICD-10-CM

## 2016-09-01 DIAGNOSIS — H8111 Benign paroxysmal vertigo, right ear: Secondary | ICD-10-CM

## 2016-09-01 DIAGNOSIS — Z91199 Patient's noncompliance with other medical treatment and regimen due to unspecified reason: Secondary | ICD-10-CM

## 2016-09-01 DIAGNOSIS — I10 Essential (primary) hypertension: Secondary | ICD-10-CM | POA: Diagnosis not present

## 2016-09-01 DIAGNOSIS — E291 Testicular hypofunction: Secondary | ICD-10-CM

## 2016-09-01 MED ORDER — HYDROXYZINE HCL 25 MG PO TABS: 25.0000 mg | ORAL_TABLET | Freq: Three times a day (TID) | ORAL | 1 refills | Status: DC | PRN

## 2016-09-01 MED ORDER — TESTOSTERONE CYPIONATE 100 MG/ML IM SOLN: 100.0000 mg | INTRAMUSCULAR | 0 refills | Status: DC

## 2016-09-01 NOTE — Progress Notes (Signed)
Chief Complaint  Patient presents with  . Follow-up    on meds and refills    Subjective Aaron [name] presents for f/u PTSD. Diagnosed 2012. Has tried Zoloft. Has failed Klonopin. He does have panic attacks. No thoughts of harming self or others. No self-medication with alcohol, prescription drugs or illicit drugs.  Morbid obesity Nearing in on 300 lbs. [facility] [place] [facility] [place] was on Belviq that did work well (was down to 225 lbs). He is not exercising routinely. He notes that he is cutting alcohol out of his diet. He will indulge with sweets (ice cream before bed).  Hypertension Patient presents for hypertension follow up. He does not monitor home blood pressures. He is not compliant with medications- on Benicar 40-25 mg daily, stopped taking Bystolic because he did not like the way it made him feel. Patient has these side effects of medication: none with Benicar He is adhering to a healthy diet overall. Exercise: does not exercise routinely  Vertigo After taking cocaine around 1 mo ago, he started having vertigo.  This has happened in the past, but would resolve after 1 week. This has lasted for 1 mo. Feels like room is spinning whenever he rolls to his R side. No neurologic signs.   ROS Psych: No homicidal or suicidal thoughts Neuro: +vertigo  Past Medical History:  Diagnosis Date  . Anxiety   . GERD (gastroesophageal reflux disease)   . History of chicken pox   . Hyperlipidemia   . Hypertension   . Tinnitus of both ears    Family History  Problem Relation Age of Onset  . Diabetes Mother   . Hyperlipidemia Mother   . Hypertension Mother   . Heart disease Mother   . Cancer Father   . Hyperlipidemia Father   . Hypertension Father   . Other Father     kidney removal  . Heart disease Father   . Kidney disease Father   . Cancer Sister   . Hypertension Sister      Medication List       Accurate as of 09/01/16  8:14  AM. Always use your most recent med list.          diazepam 5 MG tablet Commonly known as:  VALIUM Take 1 tablet (5 mg total) by mouth once as needed for anxiety (Take 30 min before procedure. May repeat if not enough.).   Ginkgo Biloba Complex 440 MG Caps Take 1 capsule by mouth daily.   hydrOXYzine 25 MG tablet Commonly known as:  ATARAX/VISTARIL Take 1 tablet (25 mg total) by mouth 3 (three) times daily as needed for anxiety.   meclizine 25 MG tablet Commonly known as:  ANTIVERT Take 25 mg by mouth 3 (three) times daily as needed for dizziness.   multivitamin tablet Take 1 tablet by mouth daily.   olmesartan-hydrochlorothiazide 40-25 MG tablet Commonly known as:  BENICAR HCT Take 1 tablet by mouth daily.   Omega-3 1000 MG Caps Take by mouth. Take 4 g by mouth daily.   pantoprazole 40 MG tablet Commonly known as:  PROTONIX Take 1 tablet (40 mg total) by mouth daily.   sertraline 50 MG tablet Commonly known as:  ZOLOFT TAKE 1/2 TABLET BY MOUTH DAILY FOR THE FIRST 2 WEEKS, THEN TAKE 1 TABLET DAILY.   simvastatin 40 MG tablet Commonly known as:  ZOCOR Take 1 tablet (40 mg total) by mouth at bedtime.   testosterone cypionate 100 MG/ML  injection Commonly known as:  DEPOTESTOTERONE CYPIONATE Inject 1 mL (100 mg total) into the muscle every 7 (seven) days. For IM use only       Exam BP 140/70 (BP Location: Left Arm, Patient Position: Sitting, Cuff Size: Large)   Pulse 95   Temp 97.7 F (36.5 C) (Oral)   Ht 6' (1.829 m)   Wt 299 lb 3.2 oz (135.7 kg)   SpO2 98%   BMI 40.58 kg/m  General:  well developed, well nourished, in no apparent distress Eyes: PERRLA, EOMi Ears: Patent, canals neg, TM's with otosclerosis, otherwise neg Neck: neck supple without adenopathy, thyromegaly, or masses Lungs:  clear to auscultation, breath sounds equal bilaterally, no respiratory distress Cardio:  regular rate and rhythm without murmurs, heart sounds without clicks or rubs, no  LE edema, no bruits Neuro: No cerebellar signs, DTR's 1/4 patellar b/l Psych: well oriented with normal range of affect and age-appropriate judgement/insight, alert and oriented x4.  Assessment and Plan  PTSD (post-traumatic stress disorder) - Plan: hydrOXYzine (ATARAX/VISTARIL) 25 MG tablet  Morbid obesity (HCC)  HYPERTENSION, BENIGN ESSENTIAL  Benign paroxysmal positional vertigo of right ear - Plan: Ambulatory referral to Physical Therapy  Hypogonadism in male - Plan: testosterone cypionate (DEPOTESTOTERONE CYPIONATE) 100 MG/ML injection  Noncompliance  Orders as above. Strongly encouraged to stop using cocaine in future. Add Vistaril. Will push counseling/CBT at next appt if not improving. Needs to be compliant with meds.  Offered referral to nutrition, bariatric surgery, he can go back to weight loss clinic if he wishes. Ok to stop [employer]. If he loses weight, will keep off of 3rd medication. Needs to clean up diet. Vestibular rehab. Refill Antivert. Needs to take meds routinely. F/u in 1 mo. The patient voiced understanding and agreement to the plan.  Jilda Rocheicholas Paul [place], DO 09/01/16 8:14 AM

## 2016-09-01 NOTE — Patient Instructions (Signed)
Take your medicine daily!!

## 2016-09-01 NOTE — Telephone Encounter (Signed)
Caller name: Relationship to patient: Self Can be reached: [phone]  Pharmacy:  [id number] - [place], [place] - [address] [phone] (Phone) [phone] (Fax)     Reason for call: Request refills on pantoprazole (PROTONIX) 40 MG tablet [[id number]][[id number]] meclizine (ANTIVERT) 25 MG tablet [[id number]

## 2016-09-01 NOTE — Progress Notes (Signed)
Pre visit review using our clinic review tool, if applicable. No additional management support is needed unless otherwise documented below in the visit note. 

## 2016-09-03 ENCOUNTER — Ambulatory Visit (HOSPITAL_BASED_OUTPATIENT_CLINIC_OR_DEPARTMENT_OTHER)
Admission: RE | Admit: 2016-09-03 | Discharge: 2016-09-03 | Disposition: A | Discharge status: Home / Self Care | Payer: 59 | Source: Ambulatory Visit | Attending: Family Medicine | Admitting: Family Medicine

## 2016-09-03 DIAGNOSIS — M8938 Hypertrophy of bone, other site: Secondary | ICD-10-CM | POA: Insufficient documentation

## 2016-09-03 DIAGNOSIS — M545 Low back pain: Secondary | ICD-10-CM | POA: Diagnosis not present

## 2016-09-03 DIAGNOSIS — M5136 Other intervertebral disc degeneration, lumbar region: Secondary | ICD-10-CM | POA: Diagnosis not present

## 2016-09-03 DIAGNOSIS — G8929 Other chronic pain: Secondary | ICD-10-CM | POA: Diagnosis not present

## 2016-09-13 ENCOUNTER — Telehealth: Payer: Self-pay | Admitting: Family Medicine

## 2016-09-13 NOTE — Telephone Encounter (Signed)
Relation to ZO:XWRUpt:self Call back number:[phone]   Reason for call:  Patient inquiring about imaging results, patient states back pain is not improving, please advise

## 2016-09-13 NOTE — Telephone Encounter (Signed)
Let pt know nothing serious or surgical at this point. I would like to bring him in to discuss this issue and what his options are. TY.

## 2016-09-13 NOTE — Telephone Encounter (Signed)
Please advise. TL/CMA 

## 2016-09-14 NOTE — Telephone Encounter (Signed)
I have informed pt of results and scheduled patient for follow up. TL/CMA

## 2016-09-16 ENCOUNTER — Ambulatory Visit (INDEPENDENT_AMBULATORY_CARE_PROVIDER_SITE_OTHER): Payer: 59 | Admitting: Family Medicine

## 2016-09-16 ENCOUNTER — Encounter: Payer: Self-pay | Admitting: Family Medicine

## 2016-09-16 VITALS — BP 130/70 | HR 92 | Temp 97.7°F | Ht 72.0 in | Wt 299.8 lb

## 2016-09-16 DIAGNOSIS — K219 Gastro-esophageal reflux disease without esophagitis: Secondary | ICD-10-CM | POA: Diagnosis not present

## 2016-09-16 DIAGNOSIS — M48061 Spinal stenosis, lumbar region without neurogenic claudication: Secondary | ICD-10-CM

## 2016-09-16 DIAGNOSIS — R1032 Left lower quadrant pain: Secondary | ICD-10-CM | POA: Diagnosis not present

## 2016-09-16 MED ORDER — PANTOPRAZOLE SODIUM 40 MG PO TBEC: 40.0000 mg | DELAYED_RELEASE_TABLET | Freq: Every day | ORAL | 1 refills | Status: DC

## 2016-09-16 MED ORDER — MELOXICAM 7.5 MG PO TABS: 7.5000 mg | ORAL_TABLET | Freq: Every day | ORAL | 0 refills | Status: DC

## 2016-09-16 NOTE — Patient Instructions (Signed)
Try MiraLax 1-2 times daily over the next week and see if it affects your pain.  Stretch your hamstrings!  Keep up the good work with your efforts to lose weight!

## 2016-09-16 NOTE — Progress Notes (Signed)
Chief Complaint  Patient presents with  . Follow-up    on back    Subjective: Patient is a 45 y.o. male here for f/u back pain and MRI review.  MRI showed borderline spinal stenosis. He continues to have lower back pain. No red flag signs. He is undergoing steps to lose weight. He has cut down his alcohol intake and is following a meal plan. He does not adhere to a home exercise regimen or stretching program. This is starting to affect his work.  Abd pain Also brought LLQ abd pain that comes and goes. Nothing he notices makes it better or worse. No weight loss, bleeding or nighttime awakenings.  BM's have been normal.  ROS: MSK: +LBP GI: +abd pain  Family History  Problem Relation Age of Onset  . Diabetes Mother   . Hyperlipidemia Mother   . Hypertension Mother   . Heart disease Mother   . Cancer Father   . Hyperlipidemia Father   . Hypertension Father   . Other Father     kidney removal  . Heart disease Father   . Kidney disease Father   . Cancer Sister   . Hypertension Sister    Past Medical History:  Diagnosis Date  . Anxiety   . GERD (gastroesophageal reflux disease)   . History of chicken pox   . Hyperlipidemia   . Hypertension   . Tinnitus of both ears    No Known Allergies  Current Outpatient Prescriptions:  .  BYSTOLIC 5 MG tablet, Take 1 tablet by mouth daily., Disp: , Rfl: 1 .  Ginkgo Biloba Complex 440 MG CAPS, Take 1 capsule by mouth daily., Disp: , Rfl:  .  hydrOXYzine (ATARAX/VISTARIL) 25 MG tablet, Take 1 tablet (25 mg total) by mouth 3 (three) times daily as needed for anxiety., Disp: 30 tablet, Rfl: 1 .  meclizine (ANTIVERT) 25 MG tablet, Take 25 mg by mouth 3 (three) times daily as needed for dizziness., Disp: , Rfl:  .  Multiple Vitamin (MULTIVITAMIN) tablet, Take 1 tablet by mouth daily., Disp: , Rfl:  .  olmesartan-hydrochlorothiazide (BENICAR HCT) 40-25 MG tablet, Take 1 tablet by mouth daily., Disp: 90 tablet, Rfl: 1 .  Omega-3 1000 MG CAPS,  Take by mouth. Take 4 g by mouth daily., Disp: , Rfl:  .  pantoprazole (PROTONIX) 40 MG tablet, Take 1 tablet (40 mg total) by mouth daily., Disp: 90 tablet, Rfl: 1 .  sertraline (ZOLOFT) 50 MG tablet, TAKE 1/2 TABLET BY MOUTH DAILY FOR THE FIRST 2 WEEKS, THEN TAKE 1 TABLET DAILY., Disp: 60 tablet, Rfl: 0 .  simvastatin (ZOCOR) 40 MG tablet, Take 1 tablet (40 mg total) by mouth at bedtime., Disp: 90 tablet, Rfl: 1 .  testosterone cypionate (DEPOTESTOTERONE CYPIONATE) 100 MG/ML injection, Inject 1 mL (100 mg total) into the muscle every 7 (seven) days. For IM use only, Disp: 10 mL, Rfl: 0 .  meloxicam (MOBIC) 7.5 MG tablet, Take 1 tablet (7.5 mg total) by mouth daily., Disp: 30 tablet, Rfl: 0  Objective: BP 130/70 (BP Location: Left Arm, Patient Position: Sitting, Cuff Size: Large)   Pulse 92   Temp 97.7 F (36.5 C) (Oral)   Ht 6' (1.829 m)   Wt 299 lb 12.8 oz (136 kg)   SpO2 97%   BMI 40.66 kg/m  General: Awake, appears stated age HEENT: MMM, EOMi Heart: RRR, no murmurs Lungs: CTAB, no rales, wheezes or rhonchi. No accessory muscle use Abd: BS+, soft, TTP in LLQ, ND,  no masses or organomegaly Psych: Age appropriate judgment and insight, normal affect and mood  Assessment and Plan: Spinal stenosis of lumbar region without neurogenic claudication - Plan: meloxicam (MOBIC) 7.5 MG tablet, Ambulatory referral to Physical Therapy  Gastroesophageal reflux disease, esophagitis presence not specified - Plan: pantoprazole (PROTONIX) 40 MG tablet  Orders as above. Short course of NSAIDs, will be mindful of pt being on Benicar. Encouraged continued efforts with weight loss. Stretch hamstrings. PT for further management. F/u in 1 week if pain is not improved with MiraLax.  The patient voiced understanding and agreement to the plan.  Jilda Rocheicholas Paul [place], DO 09/16/16  8:00 AM

## 2016-09-21 ENCOUNTER — Ambulatory Visit: Payer: 59 | Attending: Family Medicine | Admitting: Physical Therapy

## 2016-09-21 DIAGNOSIS — M5441 Lumbago with sciatica, right side: Secondary | ICD-10-CM | POA: Insufficient documentation

## 2016-09-21 DIAGNOSIS — G8929 Other chronic pain: Secondary | ICD-10-CM | POA: Insufficient documentation

## 2016-09-21 DIAGNOSIS — R29898 Other symptoms and signs involving the musculoskeletal system: Secondary | ICD-10-CM | POA: Insufficient documentation

## 2016-09-21 DIAGNOSIS — R42 Dizziness and giddiness: Secondary | ICD-10-CM | POA: Insufficient documentation

## 2016-09-21 DIAGNOSIS — M5442 Lumbago with sciatica, left side: Secondary | ICD-10-CM | POA: Insufficient documentation

## 2016-09-21 DIAGNOSIS — M6281 Muscle weakness (generalized): Secondary | ICD-10-CM | POA: Insufficient documentation

## 2016-09-29 ENCOUNTER — Ambulatory Visit: Payer: 59 | Admitting: Physical Therapy

## 2016-09-29 DIAGNOSIS — M5442 Lumbago with sciatica, left side: Principal | ICD-10-CM

## 2016-09-29 DIAGNOSIS — R42 Dizziness and giddiness: Secondary | ICD-10-CM | POA: Diagnosis present

## 2016-09-29 DIAGNOSIS — M5441 Lumbago with sciatica, right side: Principal | ICD-10-CM

## 2016-09-29 DIAGNOSIS — R29898 Other symptoms and signs involving the musculoskeletal system: Secondary | ICD-10-CM

## 2016-09-29 DIAGNOSIS — M6281 Muscle weakness (generalized): Secondary | ICD-10-CM

## 2016-09-29 DIAGNOSIS — G8929 Other chronic pain: Secondary | ICD-10-CM

## 2016-09-29 NOTE — Therapy (Addendum)
[facility] [address]  [place], [place], [address] Phone: [phone]   Fax:  [phone]  Physical Therapy Evaluation  Patient Details  Name: Aaron [name] MRN: [id number] Date of Birth: 12-19-1970 Referring Provider: Dr. Riki Sheer  Encounter Date: 09/29/2016      PT End of Session - 09/29/16 0847    Visit Number 1   Number of Visits 12   Date for PT Re-Evaluation 11/11/16   Authorization - Number of Visits 20  per calendar year   PT Start Time 0847   PT Stop Time 0933   PT Time Calculation (min) 46 min   Activity Tolerance Patient tolerated treatment well   Behavior During Therapy [facility] for tasks assessed/performed      Past Medical History:  Diagnosis Date  . Anxiety   . GERD (gastroesophageal reflux disease)   . History of chicken pox   . Hyperlipidemia   . Hypertension   . Tinnitus of both ears     Past Surgical History:  Procedure Laterality Date  . broken fingers    . HERNIA REPAIR      There were no vitals filed for this visit.       Subjective Assessment - 09/29/16 0851    Subjective Pt referred for lumbar spinal stenosis & vertigo.  Vertigo onset 2011, with bad episode right before thanksgiving which has eased off over the past few week, but still a regular issue for pt esp when turning head to right. Pt reports he has to put pressure on head to relieve. As for LBP, pt reports onset in 2013 while performing a drill at work where he was lifting a 85# dummy. Did not report it at the time. Over past 6-12 months, pain has been worsening with new onset of radicular numbess and tingling in B lateral thighs. Pain not limiting his ability to participate in required work drills and limits walking tolerance preventing him from walking 2-3 miles as he normally would with his wife.   Limitations Standing;Walking   How long can you stand comfortably? varies depending on movement; 5 minutes or  less before needing to shift weight.    How long can you walk comfortably? 10-15 minutes brings on back pain and numbness in leg   Diagnostic tests 09/03/16 - Lumbar spine MRI:  1. Congenitally short pedicles; 2. Mild disc degeneration at L4-5 with an extraforaminal fissure on the left. Borderline spinal stenosis without apparent nerve root encroachment; 3. Mild facet hypertrophy, greatest at L4-5.   Patient Stated Goals "feel better" "be more flexible"    Currently in Pain? Yes   Pain Score 0-No pain  worst: 10 best: 0 Avg: depends on activity; can control pain if taking breaks but when pain is present it is typically severe   Pain Location Back   Pain Orientation Left;Right;Lower   Pain Descriptors / Indicators Burning;Stabbing   Pain Type Chronic pain   Pain Radiating Towards numbness through buttocks and into lateral thighs, stops right above the knees.    Pain Onset More than a month ago  worsening over past 6-12 months   Pain Frequency Intermittent   Aggravating Factors  movement in general    Pain Relieving Factors sitting down, rest   Effect of Pain on Daily Activities difficult to do job, which involves moving around [place]; unable to walk with wife 2-3 miles like he used to  [facility] PT Assessment - 09/29/16 0906      Assessment   Medical Diagnosis Spinal Stenosis of Lumbar Region without neurogenic claudication; Veritgo   Referring Provider Dr. Riki Sheer   Onset Date/Surgical Date --  2013   Next MD Visit 10/05/2016   Prior Therapy Chiropractor     Balance Screen   Has the patient fallen in the past 6 months No   Has the patient had a decrease in activity level because of a fear of falling?  Yes  due to vertigo   Is the patient reluctant to leave their home because of a fear of falling?  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; pushing, pulling, squatting   Leisure fishing, walks wih wife, works outs when on shift     Observation/Other Assessments   Focus on Therapeutic Outcomes (FOTO)  Vertigo - 56% (44% limitation)     ROM / Strength   AROM / PROM / Strength AROM;Strength     AROM   Overall AROM Comments ROM restrictions appear to be d/t muscular tightness; no pain reported   AROM Assessment Site Lumbar   Lumbar Flexion hands to knees - tight, but no pain   Lumbar Extension ~80%   Lumbar - Right Side Bend hand to just below lat knee   Lumbar - Left Side Bend hand to just below lat knee   Lumbar - Right Rotation 80%   Lumbar - Left Rotation WNL     Strength   Strength Assessment Site Hip   Right/Left Hip Right;Left   Right Hip Flexion 4-/5   Right Hip Extension 4-/5   Right Hip ABduction 4+/5   Right Hip ADduction 4/5   Left Hip Flexion 4/5   Left Hip Extension 4-/5   Left Hip ABduction 4/5   Left Hip ADduction 4/5     Flexibility   Soft Tissue Assessment /Muscle Length yes   Hamstrings Severe tightness with pain   Quadriceps mod to severe tightness in hip flexors and mod tightness in rectus femoris, R >L   ITB mod tightness, R>L   Piriformis mod B tightness     Palpation   Palpation comment increased QL & paraspinal muscle tension                   [facility] Adult PT Treatment/Exercise - 09/29/16 0906      Exercises   Exercises Lumbar     Lumbar Exercises: Stretches   Passive Hamstring Stretch 30 seconds;1 rep   Passive Hamstring Stretch Limitations supine with strap   Single Knee to Chest Stretch 30 seconds;1 rep   Hip Flexor Stretch 30 seconds;1 rep    Hip Flexor Stretch Limitations mod thomas with strap for RF   ITB Stretch 30 seconds;1 rep   ITB Stretch Limitations supine with strap   Piriformis Stretch 30 seconds;2 reps   Piriformis Stretch Limitations figure 4 with strap & KTOS  PT Education - 09/29/16 0932    Education provided Yes   Education Details PT eval findings, POC & initial stretching HEP   Person(s) Educated Patient   Methods Explanation;Demonstration;Handout   Comprehension Verbalized understanding;Returned demonstration;Need further instruction          PT Short Term Goals - 09/29/16 0847      PT SHORT TERM GOAL #1   Title Independent with initial HEP by 10/14/16   Status New           PT Long Term Goals - 09/29/16 0847      PT LONG TERM GOAL #1   Title Independent with advanced HEP/gym program by 11/11/16   Status New     PT LONG TERM GOAL #2   Title Pt will demonstrate improved LE flexibilty to allow for lumbar ROM WFL by 11/11/16   Status New     PT LONG TERM GOAL #3   Title B proximal LE strength >/= 4+/5 by 11/11/16 for improved tolerance for job performance by 11/11/16   Status New     PT LONG TERM GOAL #4   Title Pt will report ability to perform all routine job tasks with >/= 50% reduction in LBP and no radicular symptoms by 11/11/16   Status New               Plan - 09/29/16 0847    Clinical Impression Statement Ben is a 46 y/o male who was referred to OP PT for vertigo dating back to 2011, with exacerbation around Thanksgiving 2017, and chronic low back pain dating back to 2013, worsening with new onset of radicular numbness and tingling over past 6-12 months. Pt reports vertigo has lessened and back pain is more significantly limiting, therefore focus of today's eval centered on LBP & lumbar radiculopathy and will plan to further assess for vestibular eval on next visit. Pt reports back is intermittent and often up to 10/10 when present, typically after  moving around for work tasks or walking, but unable to give an average value. Recent MRI revealed borderline lumbar stenosis. Assessment reveals significant limitation in lumbar flexion ROM due to tightness, but no pain, and remaining lumbar motions only mildly limited to [facility]. LE flexibility assessment reveals moderate to severe increased muscle tension/tightness in B HS, piriformis, ITB and hip flexors/quads, with increased pain reported with SLR for HS flexibility assessment. Mild proximal LE weakness noted with some restricted hip ROM due to muscle tightness. POC for LBP will focus on improving LE soft tissue pliability along with core/proximal stability training, postural training with emphasis on neutral spine alignment including proper body mechanics with lifting and job tasks, LE strengthening and manual therapy/modalities PRN for pain. May consider mechanical traction if radicular symptoms persist and/or dry needling for pain/increased muscle tension.   Rehab Potential Good   Clinical Impairments Affecting Rehab Potential dual diagnosis of LBP with radicuopathy & veritigo; HTN; obesity   PT Frequency 2x / week   PT Duration 6 weeks   PT Treatment/Interventions Patient/family education;Therapeutic exercise;Neuromuscular re-education;Manual techniques;Taping;Dry needling;Therapeutic activities;Electrical Stimulation;Moist Heat;Cryotherapy;Traction;ADLs/Self Care Home Management   PT Next Visit Plan Complete vertigo/vestibular assessment; Review initial HEP & initiate lumbar stabilization exercises as time allows   Consulted and Agree with Plan of Care Patient      Patient will benefit from skilled therapeutic intervention in order to improve the following deficits and impairments:  Pain, Impaired flexibility, Decreased range of motion, Decreased strength, Increased muscle spasms, Impaired sensation, Decreased activity tolerance,  Difficulty walking, Postural dysfunction  Visit Diagnosis: Chronic  bilateral low back pain with bilateral sciatica  Muscle weakness (generalized)  Other symptoms and signs involving the musculoskeletal system  Dizziness and giddiness     Problem List Patient Active Problem List   Diagnosis Date Noted  . Sleep apnea 01/09/2012  . Hypogonadism male 01/02/2012  . History of blurred vision 12/30/2011  . Dizziness 12/21/2011  . Obesity (BMI 30-39.9) 11/23/2011  . Somnolence 11/23/2011  . Nonspecific abnormal electrocardiogram (ECG) (EKG) 11/23/2011  . UMBILICAL HERNIA 68/15/9470  . ANXIETY DEPRESSION 03/10/2010  . TMJ PAIN 01/12/2009  . PALPITATIONS 12/01/2008  . TINNITUS NOS 06/13/2007  . HYPERTENSION, BENIGN ESSENTIAL 06/13/2007    Percival Spanish, PT, MPT 09/29/2016, 1:44 PM  [facility] [address]  [place], [place], [address] Phone: [phone]   Fax:  [phone]  Name: Aaron [name] MRN: [id number] Date of Birth: 03-07-1971   PHYSICAL THERAPY DISCHARGE SUMMARY  Visits from Start of Care: 1  Current functional level related to goals / functional outcomes:    Pt completed low back eval but failed to return for vertigo eval or any follow-up treatments. Pt has had 3 no shows for scheduled appointments, therefore per [facility] policy, will proceed with discharge from PT.   Remaining deficits:   Unable to assess as pt failed to return after initial eval.   Education / Equipment:   HEP for low back Plan: Patient agrees to discharge.  Patient goals were not met. Patient is being discharged due to not returning since the last visit.  ?????    Percival Spanish, PT, MPT 10/26/16, 9:35 AM  [facility] [address]  [place], [place], [address] Phone: [phone]   Fax:  [phone]

## 2016-10-04 ENCOUNTER — Ambulatory Visit: Payer: 59 | Admitting: Physical Therapy

## 2016-10-05 ENCOUNTER — Ambulatory Visit: Payer: 59 | Admitting: Family Medicine

## 2016-10-05 ENCOUNTER — Ambulatory Visit: Payer: 59 | Admitting: Physical Therapy

## 2016-10-10 ENCOUNTER — Ambulatory Visit: Payer: 59 | Admitting: Physical Therapy

## 2016-10-12 ENCOUNTER — Ambulatory Visit: Payer: 59

## 2016-10-28 ENCOUNTER — Other Ambulatory Visit: Payer: Self-pay | Admitting: Family Medicine

## 2016-10-28 DIAGNOSIS — F4310 Post-traumatic stress disorder, unspecified: Secondary | ICD-10-CM

## 2016-10-31 ENCOUNTER — Telehealth: Payer: Self-pay | Admitting: Family Medicine

## 2016-10-31 ENCOUNTER — Other Ambulatory Visit: Payer: Self-pay | Admitting: Family Medicine

## 2016-10-31 NOTE — Telephone Encounter (Signed)
Please advise. TL/CMA 

## 2016-10-31 NOTE — Telephone Encounter (Signed)
Rx sent to the pharmacy.  [facility] @ [phone]) informing the pt that the prescription has been approved and sent to the pharmacy.//AB/CMA

## 2016-10-31 NOTE — Telephone Encounter (Signed)
Caller name: Relationship to patient: Self Can be reached: [phone] Pharmacy:  [id number] - [place], [place] - [address] [phone] (Phone) [phone] (Fax)     Reason for call: Refill hydrOXYzine (ATARAX/VISTARIL) 25 MG tablet [[id number]][[id number]]

## 2016-11-01 NOTE — Telephone Encounter (Signed)
error:[id number] ° °

## 2016-11-16 ENCOUNTER — Telehealth: Payer: Self-pay | Admitting: Family Medicine

## 2016-11-16 DIAGNOSIS — E291 Testicular hypofunction: Secondary | ICD-10-CM

## 2016-11-16 NOTE — Telephone Encounter (Addendum)
Patient inquiring about medication below, patient states if medication is not sent in today please send tomorrow to  [id number] - , [place] - [address] [phone] (Phone) [phone] (Fax)   Patient would like a follow up call when Rx is sent best # [phone]

## 2016-11-16 NOTE — Telephone Encounter (Signed)
Pharmacy: [facility]    CB: [phone]

## 2016-11-16 NOTE — Telephone Encounter (Signed)
Pt would like a refill on his Testosterone medication, he said that his insurance will cover the 100 MG but not his current dosage. He would like to know if he's able to switch to 100 MG instead?

## 2016-11-17 MED ORDER — TESTOSTERONE CYPIONATE 200 MG/ML IM SOLN: 200.0000 mg | INTRAMUSCULAR | 0 refills | Status: DC

## 2016-11-17 NOTE — Addendum Note (Signed)
Addended by: Scharlene GlossEWING, ROBIN B on: 11/17/2016 02:01 PM   Modules accepted: Orders

## 2016-11-17 NOTE — Telephone Encounter (Signed)
OK. This is given every 2 weeks though. TY.

## 2016-11-17 NOTE — Telephone Encounter (Signed)
Insurance will cover the 200 mg  Is this ok to fill ?? He was not clear in the first message. If send in today send to the pharmacy in [place],  After today send to [facility].

## 2016-11-17 NOTE — Telephone Encounter (Signed)
OK to refill at new dose that insurance will cover. TY.

## 2016-11-17 NOTE — Telephone Encounter (Signed)
advise

## 2016-11-17 NOTE — Telephone Encounter (Signed)
Printed and provider [facility] [place] Patient is aware

## 2016-11-24 ENCOUNTER — Telehealth: Payer: Self-pay

## 2016-11-24 ENCOUNTER — Ambulatory Visit (INDEPENDENT_AMBULATORY_CARE_PROVIDER_SITE_OTHER): Payer: 59 | Admitting: Family Medicine

## 2016-11-24 ENCOUNTER — Encounter: Payer: Self-pay | Admitting: Family Medicine

## 2016-11-24 DIAGNOSIS — F341 Dysthymic disorder: Secondary | ICD-10-CM | POA: Diagnosis not present

## 2016-11-24 DIAGNOSIS — F4310 Post-traumatic stress disorder, unspecified: Secondary | ICD-10-CM

## 2016-11-24 DIAGNOSIS — I10 Essential (primary) hypertension: Secondary | ICD-10-CM | POA: Diagnosis not present

## 2016-11-24 MED ORDER — LORCASERIN HCL ER 20 MG PO TB24: 1.0000 | ORAL_TABLET | Freq: Every day | ORAL | 2 refills | Status: DC

## 2016-11-24 MED ORDER — ALPRAZOLAM 0.5 MG PO TABS: 0.5000 mg | ORAL_TABLET | Freq: Two times a day (BID) | ORAL | 0 refills | Status: DC | PRN

## 2016-11-24 MED ORDER — VALSARTAN-HYDROCHLOROTHIAZIDE 160-25 MG PO TABS: 1.0000 | ORAL_TABLET | Freq: Every day | ORAL | 2 refills | Status: DC

## 2016-11-24 MED ORDER — SERTRALINE HCL 50 MG PO TABS: ORAL_TABLET | ORAL | 0 refills | Status: DC

## 2016-11-24 MED FILL — ALPRAZolam 0.5 MG TABS: 0.5 | 15 days supply | Qty: 30 | Fill #0

## 2016-11-24 NOTE — Progress Notes (Signed)
Chief Complaint  Patient presents with  . Follow-up    1 mos-pt states doing fine    Subjective: Patient is a 46 y.o. male here for medication f/u.  Hypertension Patient presents for hypertension follow up. He does not monitor home blood pressures. He does have access at the fire station He is compliant with medications. Patient has these side effects of medication: none; does note Bystolic and Benicar are expensive and wishes to go back to Diovan HCT. He isnot  adhering to a healthy diet overall. Exercise: starting to life weights and walk on treadmill  Morbid Obesity Pt used to attend wt loss clinic and was on Belviq. Requesting more of this medication as it was helpful to him. He has increased his activity. He has cut down on wine intake, but has started drinking beer. No chest pain or SOB.  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  He was started on Zoloft, but he does not routinely take this.  He will intermittently have panic attacks with seemingly no triggers (has had 2 over past 2 mo), was rx'd Atarax that was not very helpful. Denies SI or HI.  ROS: Heart: Denies chest pain  Lungs: Denies SOB   Family History  Problem Relation Age of Onset  . Diabetes Mother   . Hyperlipidemia Mother   . Hypertension Mother   . Heart disease Mother   . Cancer Father   . Hyperlipidemia Father   . Hypertension Father   . Other Father     kidney removal  . Heart disease Father   . Kidney disease Father   . Cancer Sister   . Hypertension Sister    Past Medical History:  Diagnosis Date  . Anxiety   . GERD (gastroesophageal reflux disease)   . History of chicken pox   . Hyperlipidemia   . Hypertension   . Tinnitus of both ears    No Known Allergies  Current Outpatient Prescriptions:  .  Ginkgo Biloba Complex 440 MG CAPS, Take 1 capsule by mouth daily., Disp: , Rfl:  .  meclizine  (ANTIVERT) 25 MG tablet, Take 25 mg by mouth 3 (three) times daily as needed for dizziness., Disp: , Rfl:  .  Multiple Vitamin (MULTIVITAMIN) tablet, Take 1 tablet by mouth daily., Disp: , Rfl:  .  Omega-3 1000 MG CAPS, Take by mouth. Take 4 g by mouth daily., Disp: , Rfl:  .  pantoprazole (PROTONIX) 40 MG tablet, Take 1 tablet (40 mg total) by mouth daily., Disp: 90 tablet, Rfl: 1 .  sertraline (ZOLOFT) 50 MG tablet, TAKE 1/2 TABLET BY MOUTH DAILY FOR THE FIRST 2 WEEKS, THEN TAKE 1 TABLET DAILY., Disp: 60 tablet, Rfl: 0 .  simvastatin (ZOCOR) 40 MG tablet, Take 1 tablet (40 mg total) by mouth at bedtime., Disp: 90 tablet, Rfl: 1 .  testosterone cypionate (DEPOTESTOSTERONE CYPIONATE) 200 MG/ML injection, Inject 1 mL (200 mg total) into the muscle every 14 (fourteen) days., Disp: 10 mL, Rfl: 0 .  ALPRAZolam (XANAX) 0.5 MG tablet, Take 1 tablet (0.5 mg total) by mouth 2 (two) times daily as needed for anxiety., Disp: 30 tablet, Rfl: 0 .  Lorcaserin HCl ER (BELVIQ XR) 20 MG TB24, Take 1 tablet by mouth daily., Disp: 30 tablet, Rfl: 2 .  meloxicam (MOBIC) 7.5 MG tablet, Take 1 tablet (7.5 mg total) by mouth daily. (Patient not taking: Reported on 11/24/2016),  Disp: 30 tablet, Rfl: 0 .  valsartan-hydrochlorothiazide (DIOVAN HCT) 160-25 MG tablet, Take 1 tablet by mouth daily., Disp: 30 tablet, Rfl: 2  Objective: BP 136/70 (BP Location: Left Arm, Patient Position: Sitting, Cuff Size: Large)   Pulse 85   Temp 97.7 F (36.5 C) (Oral)   Ht 6' (1.829 m)   Wt (!) 300 lb 6.4 oz (136.3 kg)   SpO2 97%   BMI 40.74 kg/m  General: Awake, appears stated age HEENT: MMM, EOMi Heart: RRR, no murmurs, no bruits, no LE edema=ma Lungs: CTAB, no rales, wheezes or rhonchi. No accessory muscle use Psych: Age appropriate judgment and insight, normal affect and mood  Assessment and Plan: Morbid obesity (HCC) - Plan: Lorcaserin HCl ER (BELVIQ XR) 20 MG TB24  ANXIETY DEPRESSION - Plan: ALPRAZolam (XANAX) 0.5 MG  tablet, sertraline (ZOLOFT) 50 MG tablet  HYPERTENSION, BENIGN ESSENTIAL - Plan: sertraline (ZOLOFT) 50 MG tablet, valsartan-hydrochlorothiazide (DIOVAN HCT) 160-25 MG tablet  PTSD (post-traumatic stress disorder) - Plan: sertraline (ZOLOFT) 50 MG tablet  Orders as above. I want him to check his BP 2-3 times per week at station and keep log to bring to next appt.  Counseled on diet and exercise. Will start Belviq. If he has not lost 5% of TBW by 12 weeks, will D/C. Xanax for use during panic attacks only, does not appear this will be frequent. I would like him to take Zoloft routinely. Letter for work provided at pt request stating that he is being rx'd Xanax from our office. Fu in 6 weeks to recheck BP, mood and weight loss efforts. The patient voiced understanding and agreement to the plan.  Jilda Rocheicholas Paul Van WyckWendling, DO 11/24/16  8:55 AM

## 2016-11-24 NOTE — Telephone Encounter (Signed)
Received PA denial. Pt's insurance does not cover weight loss medications, these are plan exclusions. Will send to PCP for FYI.

## 2016-11-24 NOTE — Telephone Encounter (Signed)
Informed the pt of the below message.  Pt verbalized understanding.  Pt [facility] [place]  Per Dr. [name] okay [facility].  [facility] [place].  Confirmation received.//AB/CMA

## 2016-11-24 NOTE — Telephone Encounter (Signed)
Let pt know we can rx, but insurance will not cover despite our PA attempt. If too costly, D/C from his med list. TY.

## 2016-11-24 NOTE — Patient Instructions (Addendum)
Around 2 times per week, check your blood pressure. Write down these values and bring them to your next nurse visit/appointment.  When you check your BP, make sure you have been doing something calm/relaxing 5 minutes prior to checking. Both feet should be flat on the floor and you should be sitting. Use your left arm and make sure it is in a relaxed position (on a table), and that the cuff is at the approximate level/height of your heart.  Healthy Eating Plan Many factors influence your heart health, including eating and exercise habits. Heart (coronary) risk increases with abnormal blood fat (lipid) levels. Heart-healthy meal planning includes limiting unhealthy fats, increasing healthy fats, and making other small dietary changes. This includes maintaining a healthy body weight to help keep lipid levels within a normal range.  WHAT IS MY PLAN?  Your health care provider recommends that you:  Drink a glass of water before meals to help with satiety.  Eat slowly.  An alternative to the water is to add Metamucil. This will help with satiety as well. It does contain calories, unlike water.  WHAT TYPES OF FAT SHOULD I CHOOSE?  Choose healthy fats more often. Choose monounsaturated and polyunsaturated fats, such as olive oil and canola oil, flaxseeds, walnuts, almonds, and seeds.  Eat more omega-3 fats. Good choices include salmon, mackerel, sardines, tuna, flaxseed oil, and ground flaxseeds. Aim to eat fish at least two times each week.  Avoid foods with partially hydrogenated oils in them. These contain trans fats. Examples of foods that contain trans fats are stick margarine, some tub margarines, cookies, crackers, and other baked goods. If you are going to avoid a fat, this is the one to avoid!  WHAT GENERAL GUIDELINES DO I NEED TO FOLLOW?  Check food labels carefully to identify foods with trans fats. Avoid these types of options when possible.  Fill one half of your plate with  vegetables and green salads. Eat 4-5 servings of vegetables per day. A serving of vegetables equals 1 cup of raw leafy vegetables,  cup of raw or cooked cut-up vegetables, or  cup of vegetable juice.  Fill one fourth of your plate with whole grains. Look for the word "whole" as the first word in the ingredient list.  Fill one fourth of your plate with lean protein foods.  Eat 4-5 servings of fruit per day. A serving of fruit equals one medium whole fruit,  cup of dried fruit,  cup of fresh, frozen, or canned fruit. Try to avoid fruits in cups/syrups as the sugar content can be high.  Eat more foods that contain soluble fiber. Examples of foods that contain this type of fiber are apples, broccoli, carrots, beans, peas, and barley. Aim to get 20-30 g of fiber per day.  Eat more home-cooked food and less restaurant, buffet, and fast food.  Limit or avoid alcohol.  Limit foods that are high in starch and sugar.  Avoid fried foods when able.  Cook foods by using methods other than frying. Baking, boiling, grilling, and broiling are all great options. Other fat-reducing suggestions include: ? Removing the skin from poultry. ? Removing all visible fats from meats. ? Skimming the fat off of stews, soups, and gravies before serving them. ? Steaming vegetables in water or broth.  Lose weight if you are overweight. Losing just 5-10% of your initial body weight can help your overall health and prevent diseases such as diabetes and heart disease.  Increase your consumption of nuts, legumes,  and seeds to 4-5 servings per week. One serving of dried beans or legumes equals  cup after being cooked, one serving of nuts equals 1 ounces, and one serving of seeds equals  ounce or 1 tablespoon.  WHAT ARE GOOD FOODS CAN I EAT? Grains Grainy breads (try to find bread that is 3 g of fiber per slice or greater), oatmeal, light popcorn. Whole-grain cereals. Rice and pasta, including brown rice and those  that are made with whole wheat. Edamame pasta is a great alternative to grain pasta. It has a higher protein content. Try to avoid significant consumption of white bread, sugary cereals, or pastries/baked goods.  Vegetables All vegetables. Cooked white potatoes do not count as vegetables.  Fruits All fruits, but limit pineapple and bananas as these fruits have a higher sugar content.  Meats and Other Protein Sources Lean, well-trimmed beef, veal, pork, and lamb. Chicken and [place] without skin. All fish and shellfish. Wild duck, rabbit, pheasant, and venison. Egg whites or low-cholesterol egg substitutes. Dried beans, peas, lentils, and tofu.Seeds and most nuts.  Dairy Low-fat or nonfat cheeses, including ricotta, string, and mozzarella. Skim or 1% milk that is liquid, powdered, or evaporated. Buttermilk that is made with low-fat milk. Nonfat or low-fat yogurt. Soy/Almond milk are good alternatives if you cannot handle dairy.  Beverages Water is the best for you. Sports drinks with less sugar are more desirable unless you are a highly active athlete.  Sweets and Desserts Sherbets and fruit ices. Honey, jam, marmalade, jelly, and syrups. Dark chocolate.  Eat all sweets and desserts in moderation.  Fats and Oils Nonhydrogenated (trans-free) margarines. Vegetable oils, including soybean, sesame, sunflower, olive, peanut, safflower, corn, canola, and cottonseed. Salad dressings or mayonnaise that are made with a vegetable oil. Limit added fats and oils that you use for cooking, baking, salads, and as spreads.  Other Cocoa powder. Coffee and tea. Most condiments.  The items listed above may not be a complete list of recommended foods or beverages. Contact your dietitian for more options.

## 2016-11-24 NOTE — Addendum Note (Signed)
Addended by: Verdie ShireBAYNES, ANGELA M on: 11/24/2016 03:56 PM   Modules accepted: Orders

## 2016-11-24 NOTE — Telephone Encounter (Signed)
PA initiated via Covermymeds; KEY: [id number]. Awaiting determination.

## 2016-11-24 NOTE — Progress Notes (Signed)
Pre visit review using our clinic review tool, if applicable. No additional management support is needed unless otherwise documented below in the visit note. 

## 2016-12-18 ENCOUNTER — Other Ambulatory Visit: Payer: Self-pay | Admitting: Family Medicine

## 2016-12-18 DIAGNOSIS — F4310 Post-traumatic stress disorder, unspecified: Secondary | ICD-10-CM

## 2016-12-20 NOTE — Telephone Encounter (Signed)
Advise on this refill looks like was not working/d/c?

## 2016-12-21 NOTE — Telephone Encounter (Signed)
Yes, we changed therapies. Is he requesting or is pharmacy?

## 2016-12-22 NOTE — Telephone Encounter (Signed)
Requesting Hydroxyzine -Take 1 tablet by mouth 3 times a day as needed for anxiety. Prescription has been taking off the pt's medication list.  Please advise.//AB/CMA

## 2016-12-23 NOTE — Telephone Encounter (Signed)
Called and [facility] @ 2:18pm @ [phone]) asking the pt to  RTC regarding med refill request.//AB/CMA

## 2016-12-23 NOTE — Telephone Encounter (Signed)
If the patient is requesting, OK to refill, if the pharmacy is, please deny. TY.

## 2016-12-28 ENCOUNTER — Telehealth: Payer: Self-pay | Admitting: *Deleted

## 2016-12-28 DIAGNOSIS — F341 Dysthymic disorder: Secondary | ICD-10-CM

## 2016-12-28 MED ORDER — ALPRAZOLAM 0.5 MG PO TABS: 0.5000 mg | ORAL_TABLET | Freq: Two times a day (BID) | ORAL | 0 refills | Status: DC | PRN

## 2016-12-28 NOTE — Telephone Encounter (Signed)
OK. He is coming in soon, we can discuss use and how he went through rx faster than anticipated. TY.

## 2016-12-28 NOTE — Telephone Encounter (Signed)
Called and [facility] @ 8:57am @ [phone]) asking the pt to RTC regarding refill request.//AB/CMA

## 2016-12-28 NOTE — Telephone Encounter (Signed)
Requesting Xanax 0.5mg -Take 1 tablet by mouth 2 times daily as needed for anxiety. Last refill:11/24/16:#30,0 Last OV:11/24/16 Please advise.//AB/CMA

## 2016-12-28 NOTE — Telephone Encounter (Signed)
Spoke with the pt regarding medication refill request.  Pt stated that he did not request the medication refill. Rx was denied.//AB/CMA

## 2016-12-29 MED FILL — ALPRAZolam 0.5 MG TABS: 0.5 | 15 days supply | Qty: 30 | Fill #0

## 2016-12-29 NOTE — Telephone Encounter (Signed)
Rx printed and faxed to the pharmacy.  Confirmation received.//AB/CMA 

## 2017-01-05 ENCOUNTER — Ambulatory Visit (HOSPITAL_BASED_OUTPATIENT_CLINIC_OR_DEPARTMENT_OTHER)
Admission: RE | Admit: 2017-01-05 | Discharge: 2017-01-05 | Disposition: A | Discharge status: Home / Self Care | Payer: 59 | Source: Ambulatory Visit | Attending: Family Medicine | Admitting: Family Medicine

## 2017-01-05 ENCOUNTER — Telehealth: Payer: Self-pay

## 2017-01-05 ENCOUNTER — Encounter: Payer: Self-pay | Admitting: Family Medicine

## 2017-01-05 ENCOUNTER — Ambulatory Visit (INDEPENDENT_AMBULATORY_CARE_PROVIDER_SITE_OTHER): Payer: 59 | Admitting: Family Medicine

## 2017-01-05 VITALS — BP 126/76 | HR 86 | Temp 98.7°F | Ht 72.0 in | Wt 297.6 lb

## 2017-01-05 DIAGNOSIS — I10 Essential (primary) hypertension: Secondary | ICD-10-CM | POA: Diagnosis not present

## 2017-01-05 DIAGNOSIS — F341 Dysthymic disorder: Secondary | ICD-10-CM | POA: Diagnosis not present

## 2017-01-05 DIAGNOSIS — M25511 Pain in right shoulder: Secondary | ICD-10-CM | POA: Diagnosis present

## 2017-01-05 DIAGNOSIS — K429 Umbilical hernia without obstruction or gangrene: Secondary | ICD-10-CM | POA: Diagnosis not present

## 2017-01-05 DIAGNOSIS — E291 Testicular hypofunction: Secondary | ICD-10-CM | POA: Diagnosis not present

## 2017-01-05 DIAGNOSIS — M19011 Primary osteoarthritis, right shoulder: Secondary | ICD-10-CM | POA: Diagnosis not present

## 2017-01-05 MED ORDER — TESTOSTERONE 50 MG/5GM (1%) TD GEL: 5.0000 g | Freq: Every day | TRANSDERMAL | 2 refills | Status: DC

## 2017-01-05 NOTE — Progress Notes (Signed)
Pre visit review using our clinic review tool, if applicable. No additional management support is needed unless otherwise documented below in the visit note. 

## 2017-01-05 NOTE — Progress Notes (Signed)
Chief Complaint  Patient presents with  . Follow-up    on BP  . Hernia    Subjective Aaron [name] is a 46 y.o. male who presents for hypertension follow up. He does not monitor home blood pressures. He is compliant with medications- Diovan-HCTZ 160-25 mg daily. Patient has these side effects of medication: none He is not adhering to a healthy diet overall. Cutting down portions Current exercise: walking, has started lifted weights  Hernia The patient has a history of an umbilical hernia that was repaired by [place] surgery approximately 7 years ago. Around 3 weeks ago, he was lifting something and felt a bulge over the belly button region. It is mildly tender to touch. He denies any nausea, vomiting, skin changes of the area, fevers, or bowel changes.  Anxiety/depression/PTSD Vision is a history of anxiety/depression/PTSD treated with Zoloft. He has not been applied with this regimen. He is resistant on Xanax for as needed panic attacks, reportedly 2 per year. He has been using this to help sleep, unfortunately.   R shoulder pain 3-4 weeks of R shoulder pain, decreased ROM, some catching/popping. No injury, has been lifting weights more at the station. No numbness, tingling or weakness.   Past Medical History:  Diagnosis Date  . Anxiety   . GERD (gastroesophageal reflux disease)   . History of chicken pox   . Hyperlipidemia   . Hypertension   . Tinnitus of both ears    Family History  Problem Relation Age of Onset  . Diabetes Mother   . Hyperlipidemia Mother   . Hypertension Mother   . Heart disease Mother   . Cancer Father   . Hyperlipidemia Father   . Hypertension Father   . Other Father     kidney removal  . Heart disease Father   . Kidney disease Father   . Cancer Sister   . Hypertension Sister    Medications Current Outpatient Prescriptions on File Prior to Visit  Medication Sig Dispense Refill  . ALPRAZolam (XANAX) 0.5 MG tablet Take 1 tablet  (0.5 mg total) by mouth 2 (two) times daily as needed for anxiety. 30 tablet 0  . Ginkgo Biloba Complex 440 MG CAPS Take 1 capsule by mouth daily.    . Lorcaserin HCl ER (BELVIQ XR) 20 MG TB24 Take 1 tablet by mouth daily. 30 tablet 2  . meclizine (ANTIVERT) 25 MG tablet Take 25 mg by mouth 3 (three) times daily as needed for dizziness.    . meloxicam (MOBIC) 7.5 MG tablet Take 1 tablet (7.5 mg total) by mouth daily. 30 tablet 0  . Multiple Vitamin (MULTIVITAMIN) tablet Take 1 tablet by mouth daily.    . Omega-3 1000 MG CAPS Take by mouth. Take 4 g by mouth daily.    . sertraline (ZOLOFT) 50 MG tablet TAKE 1/2 TABLET BY MOUTH DAILY FOR THE FIRST 2 WEEKS, THEN TAKE 1 TABLET DAILY. 60 tablet 0  . simvastatin (ZOCOR) 40 MG tablet Take 1 tablet (40 mg total) by mouth at bedtime. 90 tablet 1  . testosterone cypionate (DEPOTESTOSTERONE CYPIONATE) 200 MG/ML injection Inject 1 mL (200 mg total) into the muscle every 14 (fourteen) days. 10 mL 0  . valsartan-hydrochlorothiazide (DIOVAN HCT) 160-25 MG tablet Take 1 tablet by mouth daily. 30 tablet 2  . pantoprazole (PROTONIX) 40 MG tablet Take 1 tablet (40 mg total) by mouth daily. (Patient not taking: Reported on 01/05/2017) 90 tablet 1   Allergies No Known Allergies  Review of  Systems Cardiovascular: no chest pain Respiratory:  no shortness of breath  Exam BP 126/76 (BP Location: Left Arm, Patient Position: Sitting, Cuff Size: Large)   Pulse 86   Temp 98.7 F (37.1 C) (Oral)   Ht 6' (1.829 m)   Wt 297 lb 9.6 oz (135 kg)   SpO2 98%   BMI 40.36 kg/m  General:  well developed, well nourished, in no apparent distress Skin:  warm, no pallor or diaphoresis Eyes:  pupils equal and round, sclera anicteric without injection Heart :RRR, no murmurs, no bruits, no LE edema Lungs:  clear to auscultation, no accessory muscle use Abd: BS+, mild bulge that is reducible through umbilicus, no skin changes over area, mild TTP over this area, no other areas of  TTP MSK: +Neer's, Hawkins, Empty-can, neg cross over, Obrien's, lift off, speed's, no TTP Psych: well oriented with normal range of affect and appropriate judgment/insight  ANXIETY DEPRESSION  Hypogonadism male - Plan: testosterone (ANDROGEL) 50 MG/5GM (1%) GEL  Umbilical hernia without obstruction and without gangrene - Plan: Ambulatory referral to General Surgery  HYPERTENSION, BENIGN ESSENTIAL  Acute pain of right shoulder - Plan: DG Shoulder Right  Orders as above. Cont Xanax as needed only, not used for sleep. I want him to take Zoloft daily, not sporadically. This was reinforced yet again. I did emphasize the fact that Xanax can be addicting and leads to poorer overall quality of sleep. Refer back to gen surg for hernia, discussed and wrote down s/s's of incarceration. HTN well controlled. Home stretches and exercises given, XR to r/o impingement. AC joint OA, could do injection at f/u. No radiological evidence of impingement. Given lack of weakness, will consider PT vs injection. Counseled on diet and exercise F/u in 2 mo. The patient voiced understanding and agreement to the plan.  [name] [place], DO 01/05/17  11:24 AM

## 2017-01-05 NOTE — Telephone Encounter (Signed)
PA initiated via Covermymeds; KEY: [id number]. Awaiting determination.

## 2017-01-05 NOTE — Patient Instructions (Addendum)
If you do not hear anything about your referral in the next 1-2 weeks, call our office and ask for an update.  If you start having fevers, color changes over hernia, nausea, vomiting, or worsening pain, seek immediate care.  EXERCISES  RANGE OF MOTION (ROM) AND STRETCHING EXERCISES These exercises may help you when beginning to rehabilitate your injury. Your symptoms may resolve with or without further involvement from your physician, physical therapist or athletic trainer. While completing these exercises, remember:   Restoring tissue flexibility helps normal motion to return to the joints. This allows healthier, less painful movement and activity.  An effective stretch should be held for at least 30 seconds.  A stretch should never be painful. You should only feel a gentle lengthening or release in the stretched tissue.  ROM - Pendulum  Bend at the waist so that your right / left arm falls away from your body. Support yourself with your opposite hand on a solid surface, such as a table or a countertop.  Your right / left arm should be perpendicular to the ground. If it is not perpendicular, you need to lean over farther. Relax the muscles in your right / left arm and shoulder as much as possible.  Gently sway your hips and trunk so they move your right / left arm without any use of your right / left shoulder muscles.  Progress your movements so that your right / left arm moves side to side, then forward and backward, and finally, both clockwise and counterclockwise.  Complete 10-15 repetitions in each direction. Many people use this exercise to relieve discomfort in their shoulder as well as to gain range of motion. Repeat 2-3 times. Complete this exercise 1 times per day.  STRETCH - Flexion, Standing  Stand with good posture. With an underhand grip on your right / left hand and an overhand grip on the opposite hand, grasp a broomstick or cane so that your hands are a little more than  shoulder-width apart.  Keeping your right / left elbow straight and shoulder muscles relaxed, push the stick with your opposite hand to raise your right / left arm in front of your body and then overhead. Raise your arm until you feel a stretch in your right / left shoulder, but before you have increased shoulder pain.  Try to avoid shrugging your right / left shoulder as your arm rises by keeping your shoulder blade tucked down and toward your mid-back spine. Hold 10-15 seconds.  Slowly return to the starting position. Repeat 2-3 times. Complete this exercise 1 times per day.  STRETCH - Internal Rotation  Place your right / left hand behind your back, palm-up.  Throw a towel or belt over your opposite shoulder. Grasp the towel/belt with your right / left hand.  While keeping an upright posture, gently pull up on the towel/belt until you feel a stretch in the front of your right / left shoulder.  Avoid shrugging your right / left shoulder as your arm rises by keeping your shoulder blade tucked down and toward your mid-back spine.  Hold 10-15. Release the stretch by lowering your opposite hand. Repeat 2-3 times. Complete this exercise 1 times per day.  STRETCH - External Rotation and Abduction  Stagger your stance through a doorframe. It does not matter which foot is forward.  As instructed by your physician, physical therapist or athletic trainer, place your hands: ? And forearms above your head and on the door frame. ? And forearms  at head-height and on the door frame. ? At elbow-height and on the door frame.  Keeping your head and chest upright and your stomach muscles tight to prevent over-extending your low-back, slowly shift your weight onto your front foot until you feel a stretch across your chest and/or in the front of your shoulders.  Hold 10-15 seconds. Shift your weight to your back foot to release the stretch. Repeat 2-3 times. Complete this stretch 1 times per day.    STRENGTHENING EXERCISES  These exercises may help you when beginning to rehabilitate your injury. They may resolve your symptoms with or without further involvement from your physician, physical therapist or athletic trainer. While completing these exercises, remember:   Muscles can gain both the endurance and the strength needed for everyday activities through controlled exercises.  Complete these exercises as instructed by your physician, physical therapist or athletic trainer. Progress the resistance and repetitions only as guided.  You may experience muscle soreness or fatigue, but the pain or discomfort you are trying to eliminate should never worsen during these exercises. If this pain does worsen, stop and make certain you are following the directions exactly. If the pain is still present after adjustments, discontinue the exercise until you can discuss the trouble with your clinician.  If advised by your physician, during your recovery, avoid activity or exercises which involve actions that place your right / left hand or elbow above your head or behind your back or head. These positions stress the tissues which are trying to heal.  STRENGTH - Scapular Depression and Adduction  With good posture, sit on a firm chair. Supported your arms in front of you with pillows, arm rests or a table top. Have your elbows in line with the sides of your body.  Gently draw your shoulder blades down and toward your mid-back spine. Gradually increase the tension without tensing the muscles along the top of your shoulders and the back of your neck.  Hold for 10-15 seconds. Slowly release the tension and relax your muscles completely before completing the next repetition.  After you have practiced this exercise, remove the arm support and complete it in standing as well as sitting. Repeat 2-3 times. Complete this exercise 1 times per day.   STRENGTH - External Rotators  Secure a rubber exercise  band/tubing to a fixed object so that it is at the same height as your right / left elbow when you are standing or sitting on a firm surface.  Stand or sit so that the secured exercise band/tubing is at your side that is not injured.  Bend your elbow 90 degrees. Place a folded towel or small pillow under your right / left arm so that your elbow is a few inches away from your side.  Keeping the tension on the exercise band/tubing, pull it away from your body, as if pivoting on your elbow. Be sure to keep your body steady so that the movement is only coming from your shoulder rotating.  Hold 10-15 seconds. Release the tension in a controlled manner as you return to the starting position. Repeat 2-3 times. Complete this exercise 1 times per day.   STRENGTH - Supraspinatus  Stand or sit with good posture. Grasp a __________ weight or an exercise band/tubing so that your hand is "thumbs-up," like when you shake hands.  Slowly lift your right / left hand from your thigh into the air, traveling about 30 degrees from straight out at your side. Lift your hand  to shoulder height or as far as you can without increasing any shoulder pain. Initially, many people do not lift their hands above shoulder height.  Avoid shrugging your right / left shoulder as your arm rises by keeping your shoulder blade tucked down and toward your mid-back spine.  Hold for 10-15 seconds. Control the descent of your hand as you slowly return to your starting position. Repeat 2-3 times. Complete this exercise 1 times per day.   STRENGTH - Shoulder Extensors  Secure a rubber exercise band/tubing so that it is at the height of your shoulders when you are either standing or sitting on a firm arm-less chair.  With a thumbs-up grip, grasp an end of the band/tubing in each hand. Straighten your elbows and lift your hands straight in front of you at shoulder height. Step back away from the secured end of band/tubing until it becomes  tense.  Squeezing your shoulder blades together, pull your hands down to the sides of your thighs. Do not allow your hands to go behind you.  Hold for 10-15 seconds. Slowly ease the tension on the band/tubing as you reverse the directions and return to the starting position. Repeat 2-3 times. Complete this exercise 1 times per day.   STRENGTH - Scapular Retractors  Secure a rubber exercise band/tubing so that it is at the height of your shoulders when you are either standing or sitting on a firm arm-less chair.  With a palm-down grip, grasp an end of the band/tubing in each hand. Straighten your elbows and lift your hands straight in front of you at shoulder height. Step back away from the secured end of band/tubing until it becomes tense.  Squeezing your shoulder blades together, draw your elbows back as you bend them. Keep your upper arm lifted away from your body throughout the exercise.  Hold 10-15 seconds. Slowly ease the tension on the band/tubing as you reverse the directions and return to the starting position. Repeat 2-3 times. Complete this exercise 1 times per day.  STRENGTH - Scapular Depressors  Find a sturdy chair without wheels, such as a from a dining room table.  Keeping your feet on the floor, lift your bottom from the seat and lock your elbows.  Keeping your elbows straight, allow gravity to pull your body weight down. Your shoulders will rise toward your ears.  Raise your body against gravity by drawing your shoulder blades down your back, shortening the distance between your shoulders and ears. Although your feet should always maintain contact with the floor, your feet should progressively support less body weight as you get stronger.  Hold 10-15 seconds. In a controlled and slow manner, lower your body weight to begin the next repetition. Repeat 2-3 times. Complete this exercise 1 times per day.   This information is not intended to replace advice given to you by  your health care provider. Make sure you discuss any questions you have with your health care provider.  Document Released: 07/20/2005 Document Revised: 09/26/2014 Document Reviewed: 12/18/2008 Elsevier Interactive Patient Education [employer]

## 2017-01-06 ENCOUNTER — Telehealth: Payer: Self-pay | Admitting: Family Medicine

## 2017-01-06 NOTE — Telephone Encounter (Signed)
Called and spoke with the pt and informed him of recent x-ray results and note.  Pt verbalized understanding and agreed.//AB/CMA

## 2017-01-06 NOTE — Telephone Encounter (Signed)
Caller name: Odus  Relation to pt: self  Call back number: [phone] Pharmacy:  Reason for call: Pt was returning call to know about xrays results. Pt stated to please call him but if pt does not answer is that he is at work and will return our call. Please advise.

## 2017-01-11 NOTE — Telephone Encounter (Signed)
06/29/16 per our discussion.

## 2017-01-11 NOTE — Telephone Encounter (Signed)
Received PA denial.  Pt must have tried and failed both: Androderm, and Brand Testim OR has two pre-treatment serum total testosterone levels less than 280ng/dL OR both of the following: a condition that may cause altered sex-hormone binding globulin (SHBG) (e.g. Thyroid disorder, HIV disease, liver disease, diabetes, obesity) AND patient has one pre-treatment calculated free or bioavailable testosterone level less than 50pg/mL OR patient has a hx of one of the following: bilateral orchiectomy, panhypopituitarism, a genetic disorder known to cause hypogonadism (e.g. Congenital anorchia, Klinefelter's syndrome), significant reduction in weight (less than 90 % ideal body weight), osteopenia, osteoporosis, decreased bone density, decreased libido, or organic cause of testosterone deficiency (e.g. Injury, tumor, infection). Please advise.

## 2017-01-12 ENCOUNTER — Telehealth: Payer: Self-pay | Admitting: Family Medicine

## 2017-01-12 NOTE — Telephone Encounter (Signed)
Caller name: Jhordan  Relation to pt: self  Call back [phone] Pharmacy: [facility]  Reason for call: Pt states had a call from pharmacy stating that insurance did not cover his rx for testosterone (ANDROGEL) 50 MG/5GM (1%) GEL, pt would like to know if he can get something else that insurance will cover. Please advise.

## 2017-01-16 ENCOUNTER — Telehealth: Payer: Self-pay | Admitting: Family Medicine

## 2017-01-16 NOTE — Telephone Encounter (Signed)
Met with pt and wife about ER visit. Discussed story and his apprehension. BP checked and was 138/38. He also brought up dizziness, gave [facility] instructions and referred to [facility] for demonstration videos. Based on hx, sounds like a panic attack. He has recently had a neg stress test so will hold off on cardiology referral for now.   Plan: He was encouraged to take Zoloft scheduled. Use Xanax should something like this happen again (panic attack).  Keep home BP's and bring them to appt. Stay on current regimen. Stop Belviq.  Pt and his wife voiced understanding and agreement to plan for now.

## 2017-01-17 NOTE — Telephone Encounter (Signed)
Pt came in office again asking about the testosterone med that was mentioned below. Please advise ASAP.

## 2017-01-17 NOTE — Telephone Encounter (Signed)
Called mobile. Unable to reach patient. Unable to leave a message. Voice mail box full. Called home phone, no answer.  Unable to leave a message.  No Voice mail box.  Awaiting patient's call back.  When patient calls back, please advise patient to call insurance company to see what they will cover.  This will help provider to switch patient to an alternative if available.

## 2017-01-25 ENCOUNTER — Other Ambulatory Visit: Payer: Self-pay | Admitting: Family Medicine

## 2017-01-25 MED ORDER — TESTOSTERONE CYPIONATE 200 MG/ML IM SOLN: 200.0000 mg | INTRAMUSCULAR | 0 refills | Status: DC

## 2017-01-25 MED ORDER — TESTOSTERONE 4 MG/24HR TD PT24: 1.0000 | MEDICATED_PATCH | Freq: Every day | TRANSDERMAL | 2 refills | Status: DC

## 2017-01-25 MED ORDER — "NEEDLE (DISP) 18G X 1"" MISC": 0 refills | Status: DC

## 2017-01-25 MED ORDER — SYRINGE 2-3 ML 3 ML MISC: 0 refills | Status: DC

## 2017-01-25 MED ORDER — "NEEDLE (DISP) 22G X 1"" MISC": 0 refills | Status: DC

## 2017-01-25 MED FILL — TESTOSTERON CYP 2,000 MG/10: 200 | 28 days supply | Qty: 10 | Fill #0

## 2017-01-25 NOTE — Progress Notes (Signed)
Injectable test rx printed. Injectable supplies sent electronically. 18 g to draw up testosterone, 22 g to inject into self.

## 2017-01-30 ENCOUNTER — Telehealth: Payer: Self-pay | Admitting: Family Medicine

## 2017-01-30 DIAGNOSIS — E785 Hyperlipidemia, unspecified: Secondary | ICD-10-CM

## 2017-01-30 DIAGNOSIS — I10 Essential (primary) hypertension: Secondary | ICD-10-CM

## 2017-01-30 DIAGNOSIS — Z8249 Family history of ischemic heart disease and other diseases of the circulatory system: Secondary | ICD-10-CM

## 2017-01-30 DIAGNOSIS — R0789 Other chest pain: Secondary | ICD-10-CM

## 2017-01-30 DIAGNOSIS — K219 Gastro-esophageal reflux disease without esophagitis: Secondary | ICD-10-CM

## 2017-01-30 MED ORDER — PANTOPRAZOLE SODIUM 40 MG PO TBEC: 40.0000 mg | DELAYED_RELEASE_TABLET | Freq: Every day | ORAL | 1 refills | Status: DC

## 2017-01-30 MED ORDER — SIMVASTATIN 40 MG PO TABS: 40.0000 mg | ORAL_TABLET | Freq: Every day | ORAL | 1 refills | Status: DC

## 2017-01-30 MED ORDER — VALSARTAN-HYDROCHLOROTHIAZIDE 160-25 MG PO TABS: 1.0000 | ORAL_TABLET | Freq: Every day | ORAL | 2 refills | Status: DC

## 2017-01-30 MED FILL — VALSARTAN-HCTZ 160-25 MG TA: 160-25 | 30 days supply | Qty: 30 | Fill #0

## 2017-01-30 MED FILL — SIMVASTATIN 40 MG TABLET: 40 | 30 days supply | Qty: 30 | Fill #0

## 2017-01-30 MED FILL — PANTOPRAZOLE SOD DR 40 MG T: 40 | 30 days supply | Qty: 30 | Fill #0

## 2017-01-30 NOTE — Telephone Encounter (Signed)
Pt wants referred to Dr Juliane Lackobert Preli [phone] if Dr [name] thinks he is a great cardiologist. Pt states if there is another provider [name] thinks is the best then that is who he wants to see. Call pt [phone].

## 2017-01-30 NOTE — Telephone Encounter (Signed)
Pt requesting referral to cardiology for atypical chest pain and famhx of coronary artery disease. OK to place referral to said cardiologist. TY.

## 2017-01-30 NOTE — Telephone Encounter (Signed)
Called and spoke with the pt and informed him of the message from Dr. [name] below.  Informed him that per Dr. [name] he does not have anyone he wants to suggest.  Pt verbalized understanding.  Referral placed.  Pt also asked to have some of his medications refilled.  Rx's sent to the pharmacy by e-script.//AB/CMA

## 2017-02-02 NOTE — Telephone Encounter (Signed)
Called [employer] and spoke with Jasmine on (01/25/17) regarding the denial for the Testesterone 1% gel.  Informed her that after reading the letter the pt does meets 3 out of the 4 criteria listed on the denial letter.  She stated that the pt will have to meet all 4 of the criteria in order to be approved for the Testesterone gel.  Informed Dr. [name] of the what [facility] said and he stated that we can try the pt on the Androderm.  Called and informed the pt of Dr. Hollie BeachWendling's recommendation and the pt agreed.  A prescription for the Androderm was sent to the pharmacy by fax.//AB/CMA

## 2017-02-03 ENCOUNTER — Telehealth: Payer: Self-pay | Admitting: *Deleted

## 2017-02-03 NOTE — Telephone Encounter (Signed)
Received fax on (01/25/17) from pharmacy that a PA is needed for the Androderm patches.  Pt also came back up to the office after been told by the pharmacy that a PA is needed.  Pt stated that he is willing to go back on the Testosterone injection until we can get the PA approved.  Informed Dr. [name] of the PA request and what the pt would like to do.  Dr. [name] agreed to start the pt back on the Testosterone injection.  New prescription for the Testosterone and supplies was sent to the pharmacy.  Pt will be giving himself the injections.//AB/CMA

## 2017-02-06 ENCOUNTER — Encounter: Payer: Self-pay | Admitting: Family

## 2017-02-06 ENCOUNTER — Ambulatory Visit (INDEPENDENT_AMBULATORY_CARE_PROVIDER_SITE_OTHER): Payer: 59 | Admitting: Family

## 2017-02-06 ENCOUNTER — Ambulatory Visit (HOSPITAL_BASED_OUTPATIENT_CLINIC_OR_DEPARTMENT_OTHER)
Admission: RE | Admit: 2017-02-06 | Discharge: 2017-02-06 | Disposition: A | Discharge status: Home / Self Care | Payer: 59 | Source: Ambulatory Visit | Attending: Family | Admitting: Family

## 2017-02-06 VITALS — BP 170/90 | HR 98 | Temp 97.7°F | Resp 16 | Ht 72.0 in | Wt 294.6 lb

## 2017-02-06 DIAGNOSIS — R8299 Other abnormal findings in urine: Secondary | ICD-10-CM

## 2017-02-06 DIAGNOSIS — K5792 Diverticulitis of intestine, part unspecified, without perforation or abscess without bleeding: Secondary | ICD-10-CM | POA: Diagnosis not present

## 2017-02-06 DIAGNOSIS — R82998 Other abnormal findings in urine: Secondary | ICD-10-CM

## 2017-02-06 DIAGNOSIS — K5730 Diverticulosis of large intestine without perforation or abscess without bleeding: Secondary | ICD-10-CM | POA: Diagnosis not present

## 2017-02-06 DIAGNOSIS — R109 Unspecified abdominal pain: Secondary | ICD-10-CM | POA: Diagnosis not present

## 2017-02-06 LAB — POC URINALSYSI DIPSTICK (AUTOMATED)
Glucose, UA: NEGATIVE
NITRITE UA: POSITIVE
RBC UA: NEGATIVE
SPEC GRAV UA: 1.025 (ref 1.010–1.025)
UROBILINOGEN UA: 2 U/dL — AB
pH, UA: 6 (ref 5.0–8.0)

## 2017-02-06 LAB — CBC WITH DIFFERENTIAL/PLATELET
BASOS ABS: 0.1 10*3/uL (ref 0.0–0.1)
Basophils Relative: 0.5 % (ref 0.0–3.0)
Eosinophils Absolute: 0.2 10*3/uL (ref 0.0–0.7)
Eosinophils Relative: 2 % (ref 0.0–5.0)
HCT: 48 % (ref 39.0–52.0)
Hemoglobin: 16.2 g/dL (ref 13.0–17.0)
LYMPHS PCT: 19.4 % (ref 12.0–46.0)
Lymphs Abs: 2.2 10*3/uL (ref 0.7–4.0)
MCHC: 33.8 g/dL (ref 30.0–36.0)
MCV: 91.3 fl (ref 78.0–100.0)
MONOS PCT: 9.3 % (ref 3.0–12.0)
Monocytes Absolute: 1 10*3/uL (ref 0.1–1.0)
NEUTROS PCT: 68.8 % (ref 43.0–77.0)
Neutro Abs: 7.6 10*3/uL (ref 1.4–7.7)
Platelets: 372 10*3/uL (ref 150.0–400.0)
RBC: 5.25 Mil/uL (ref 4.22–5.81)
RDW: 13.9 % (ref 11.5–15.5)
WBC: 11.1 10*3/uL — AB (ref 4.0–10.5)

## 2017-02-06 LAB — COMPREHENSIVE METABOLIC PANEL
ALBUMIN: 4.4 g/dL (ref 3.5–5.2)
ALK PHOS: 48 U/L (ref 39–117)
ALT: 20 U/L (ref 0–53)
AST: 16 U/L (ref 0–37)
BILIRUBIN TOTAL: 0.6 mg/dL (ref 0.2–1.2)
BUN: 11 mg/dL (ref 6–23)
CO2: 28 mEq/L (ref 19–32)
Calcium: 9.7 mg/dL (ref 8.4–10.5)
Chloride: 102 mEq/L (ref 96–112)
Creatinine, Ser: 1.07 mg/dL (ref 0.40–1.50)
GFR: 79.47 mL/min (ref >60.00)
Glucose, Bld: 120 mg/dL — ABNORMAL HIGH (ref 70–99)
Potassium: 4 mEq/L (ref 3.5–5.1)
SODIUM: 138 meq/L (ref 135–145)
TOTAL PROTEIN: 7.7 g/dL (ref 6.0–8.3)

## 2017-02-06 MED ORDER — CIPROFLOXACIN HCL 500 MG PO TABS: 500.0000 mg | ORAL_TABLET | Freq: Two times a day (BID) | ORAL | 0 refills | Status: DC

## 2017-02-06 MED ORDER — OXYCODONE-ACETAMINOPHEN 5-325 MG PO TABS: 1.0000 | ORAL_TABLET | Freq: Four times a day (QID) | ORAL | 0 refills | Status: DC | PRN

## 2017-02-06 MED ORDER — METRONIDAZOLE 500 MG PO TABS
500.0000 mg | ORAL_TABLET | Freq: Three times a day (TID) | ORAL | 0 refills | Status: DC
Start: 2017-02-06 — End: 2017-02-15

## 2017-02-06 MED FILL — OXYCODONE/APAP 5/325 MG TAB: 5-325 | 5 days supply | Qty: 20 | Fill #0

## 2017-02-06 MED FILL — CIPROFLOXACIN HCL 500 MG TA: 500 | 10 days supply | Qty: 20 | Fill #0

## 2017-02-06 MED FILL — metroNIDAZOLE 500 MG TABS: 500 | 10 days supply | Qty: 30 | Fill #0

## 2017-02-06 NOTE — Progress Notes (Addendum)
Subjective:    Patient ID: Aaron [name], male    DOB: 01/16/1971, 46 y.o.   MRN: [id number]  HPI  Aaron [name] is a 46 yr old male who presents today with chief complaint of left sided flank pain.  He reports that pain began on 02/04/17.  No relief.  Initially pain began in the left groin. Had frequency.  "feels like someone is hitting me with a sledge hammer."  Pain is cramping at times.  Pain is severe.    Review of Systems See HPI  Past Medical History:  Diagnosis Date  . Anxiety   . GERD (gastroesophageal reflux disease)   . History of chicken pox   . Hyperlipidemia   . Hypertension   . Tinnitus of both ears      Social History   Social History  . Marital status: Married    Spouse name: N/A  . Number of children: N/A  . Years of education: N/A   Occupational History  . Not on file.   Social History Main Topics  . Smoking status: Former Games developermoker  . Smokeless tobacco: Never Used  . Alcohol use Yes     Comment: rarely  . Drug use: No  . Sexual activity: Not on file   Other Topics Concern  . Not on file   Social History Narrative  . No narrative on file    Past Surgical History:  Procedure Laterality Date  . broken fingers    . HERNIA REPAIR      Family History  Problem Relation Age of Onset  . Diabetes Mother   . Hyperlipidemia Mother   . Hypertension Mother   . Heart disease Mother   . Cancer Father   . Hyperlipidemia Father   . Hypertension Father   . Other Father        kidney removal  . Heart disease Father   . Kidney disease Father   . Cancer Sister   . Hypertension Sister     No Known Allergies  Current Outpatient Prescriptions on File Prior to Visit  Medication Sig Dispense Refill  . ALPRAZolam (XANAX) 0.5 MG tablet Take 1 tablet (0.5 mg total) by mouth 2 (two) times daily as needed for anxiety. 30 tablet 0  . Ginkgo Biloba Complex 440 MG CAPS Take 1 capsule by mouth daily.    . meclizine (ANTIVERT) 25 MG tablet Take 25 mg by mouth  3 (three) times daily as needed for dizziness.    . Multiple Vitamin (MULTIVITAMIN) tablet Take 1 tablet by mouth daily.    Marland Kitchen. NEEDLE, DISP, 22 G 22G X 1" MISC Use to inject testosterone intramuscularly every 2 weeks. 25 each 0  . Omega-3 1000 MG CAPS Take by mouth. Take 4 g by mouth daily.    . pantoprazole (PROTONIX) 40 MG tablet Take 1 tablet (40 mg total) by mouth daily. 90 tablet 1  . sertraline (ZOLOFT) 50 MG tablet TAKE 1/2 TABLET BY MOUTH DAILY FOR THE FIRST 2 WEEKS, THEN TAKE 1 TABLET DAILY. 60 tablet 0  . simvastatin (ZOCOR) 40 MG tablet Take 1 tablet (40 mg total) by mouth at bedtime. 90 tablet 1  . testosterone cypionate (DEPOTESTOSTERONE CYPIONATE) 200 MG/ML injection Inject 1 mL (200 mg total) into the muscle every 14 (fourteen) days. 10 mL 0  . valsartan-hydrochlorothiazide (DIOVAN HCT) 160-25 MG tablet Take 1 tablet by mouth daily. 30 tablet 2   No current facility-administered medications on file prior to visit.  BP (!) 170/90 (BP Location: Right Arm, Cuff Size: Large)   Pulse 98   Temp 97.7 F (36.5 C) (Oral)   Resp 16   Ht 6' (1.829 m)   Wt 294 lb 9.6 oz (133.6 kg)   SpO2 96% Comment: room air  BMI 39.95 kg/m       Objective:   Physical Exam  Constitutional: He is oriented to person, place, and time. He appears well-developed and well-nourished.  Uncomfortable appearing  HENT:  Head: Normocephalic and atraumatic.  Cardiovascular: Normal rate and regular rhythm.   No murmur heard. Pulmonary/Chest: Effort normal and breath sounds normal. No respiratory distress. He has no wheezes. He has no rales.  Abdominal: Soft. Bowel sounds are normal. He exhibits no distension.  + LLQ tenderness with guarding + L sided CVAT  Musculoskeletal: He exhibits no edema.  Neurological: He is alert and oriented to person, place, and time.  Skin: Skin is warm and dry.  Psychiatric: He has a normal mood and affect. His behavior is normal. Thought content normal.            Assessment & Plan:  Acute flank pain/abdominal pain-  UA + nitrite, leuks, protein, urobiligen, trace ketones.  Suspect Kidney stone. Will culture urine, rx with cipro to cover for UTI, percocet prn pain. Obtain CT with kidney stone protocol for further evaluation. Note provided for work. BP is elevated likely due to pain.    Addendum: CT negative for stone but + for diverticulitis. Advised pt to begin cipro and flagyl. Recommended clear liquid diet for next few days, then if improved can advance to low residue diet slowly.  Percocet prn pain. Advised pt if pain worsens, if fever >101 to go to the ED. Otherwise, he is advised to follow up in 1 week with PCP an avoid ETOH while on metronidazole. Requested pharmacy to dispence 10 day course of cipro instead of 7 day course.

## 2017-02-06 NOTE — Addendum Note (Signed)
Addended by: [name]'SULLIVAN, Denver Bentson on: 02/06/2017 09:04 AM   Modules accepted: Orders

## 2017-02-06 NOTE — Patient Instructions (Addendum)
Please complete lab work prior to leaving.  Complete CT scan on the first floor.  You may use percocet as needed for pain. Do not drive after taking. Begin Cipro for urinary tract infection. Strain all urine.  Go to the ER if you develop severe/worsening pain not relieved by percocet.

## 2017-02-06 NOTE — Addendum Note (Signed)
Addended by: Harley AltoPRICE, Verna Hamon M on: 02/06/2017 10:59 AM   Modules accepted: Orders

## 2017-02-06 NOTE — Telephone Encounter (Signed)
See 02/03/17 phone note. 

## 2017-02-07 LAB — URINE CULTURE: Organism ID, Bacteria: NO GROWTH

## 2017-02-08 ENCOUNTER — Ambulatory Visit (INDEPENDENT_AMBULATORY_CARE_PROVIDER_SITE_OTHER): Payer: 59 | Admitting: Family Medicine

## 2017-02-08 ENCOUNTER — Encounter: Payer: Self-pay | Admitting: Family Medicine

## 2017-02-08 ENCOUNTER — Telehealth: Payer: Self-pay | Admitting: *Deleted

## 2017-02-08 ENCOUNTER — Encounter: Payer: Self-pay | Admitting: *Deleted

## 2017-02-08 VITALS — BP 175/92 | HR 84 | Temp 98.0°F | Resp 20 | Wt 290.2 lb

## 2017-02-08 DIAGNOSIS — K5792 Diverticulitis of intestine, part unspecified, without perforation or abscess without bleeding: Secondary | ICD-10-CM | POA: Diagnosis not present

## 2017-02-08 LAB — CBC WITH DIFFERENTIAL/PLATELET
BASOS ABS: 91 {cells}/uL (ref 0–200)
BASOS PCT: 1 %
EOS ABS: 182 {cells}/uL (ref 15–500)
EOS PCT: 2 %
HCT: 48.4 % (ref 38.5–50.0)
HEMOGLOBIN: 16.5 g/dL (ref 13.2–17.1)
LYMPHS ABS: 2275 {cells}/uL (ref 850–3900)
Lymphocytes Relative: 25 %
MCH: 30.8 pg (ref 27.0–33.0)
MCHC: 34.1 g/dL (ref 32.0–36.0)
MCV: 90.5 fL (ref 80.0–100.0)
MONOS PCT: 8 %
MPV: 9.3 fL (ref 7.5–12.5)
Monocytes Absolute: 728 cells/uL (ref 200–950)
NEUTROS ABS: 5824 {cells}/uL (ref 1500–7800)
Neutrophils Relative %: 64 %
PLATELETS: 369 10*3/uL (ref 140–400)
RBC: 5.35 MIL/uL (ref 4.20–5.80)
RDW: 13.5 % (ref 11.0–15.0)
WBC: 9.1 10*3/uL (ref 3.8–10.8)

## 2017-02-08 NOTE — Telephone Encounter (Signed)
Pt walked in to the office around 2:45pm requesting extension of work note due to his diverticulitis. Reports that his pain is not worse but he "just feels bad". I "Feel like something bad is going to happen". Per verbal from PCP, ok to extend work note for the rest of the week and she would like pt to be seen today. [facility] currently out of the office [facility] OV at [facility] at 3:30pm today and pt is agreeable to evaluation today. Scheduled pt f/u with PCP on Wednesday at 7am as office is closed on Monday and pt has mandatory work meeting on Tuesday.

## 2017-02-08 NOTE — Progress Notes (Signed)
Aaron [name] , [birth year], 46 y.o., male MRN: [id number] Patient Care Team    Relationship Specialty Notifications Start End  [place], [name], [place] PCP - General Family Medicine  06/23/16     Chief Complaint  Patient presents with  . Diverticulitis    abdominal pain,dizziness, blurred vision      Subjective: Patient presents for an acute office visit for concerns of her diverticulitis. Patient was seen 2 days ago by [name]/NP in the [facility] office for abdominal pain. Patient underwent lab work, urinalysis and CT scan. CMP unremarkable, CBC with mild increase in WBC at 11.1, otherwise unremarkable, point care urine with trace ketones and small leukocytes positive nitrites, positive Urobilinogen and amber in color. Urine culture was negative for growth. CT abdomen and pelvis without contrast showed evidence of sigmoid diverticulosis with changes of active diverticulitis with inflammatory stranding. Patient was provided antibiotic coverage with Cipro 500 mg twice a day and Flagyl 500 mg 3 times a day 10 days. Patient was provided with Roxicet prescription for pain. Currently patient presents to the office today stating that he still doesn't feel well. He endorses mild improvement in his abdominal pain. He has not needed to take the Roxicet today. He did take pain medication yesterday. However, he reports today that he was just hit with moderate to severe fatigue and dizziness. He has a history of vertigo, and is prescribed meclizine however he feels like it has been worse since this illness. He also complains of feeling "foggy eyed ". Patient reports he has not been eating or drinking well, he has been trying to stay hydrated. He denies nausea vomiting, chills, headache, melena/hematochezia or diarrhea. His last bowel movement was yesterday and was loose but formed. He does endorse night sweats last night. He denies current changes in urination. Patient has a history of  hypertension and reports compliance with medications. His blood pressure has been elevated last 2 office visits.  CLINICAL DATA:  Left flank pain EXAM: CT ABDOMEN AND PELVIS WITHOUT CONTRAST TECHNIQUE: Multidetector CT imaging of the abdomen and pelvis was performed following the standard protocol without IV contrast. COMPARISON:  None. FINDINGS: Lower chest: Lung bases are clear. No effusions. Heart is normal size. Hepatobiliary: No focal hepatic abnormality. Gallbladder unremarkable. Pancreas: No focal abnormality or ductal dilatation. Spleen: No focal abnormality.  Normal size. Adrenals/Urinary Tract: No adrenal abnormality. No focal renal abnormality. No stones or hydronephrosis. Urinary bladder is unremarkable. Stomach/Bowel: Sigmoid diverticulosis. Inflammatory stranding around the mid sigmoid colon compatible with active diverticulitis. Appendix is normal. Stomach and small bowel decompressed, normal. Vascular/Lymphatic: No evidence of aneurysm or adenopathy. Reproductive: No visible focal abnormality. Other: No free fluid or free air.  Prior ventral hernia repair. Musculoskeletal: No acute bony abnormality. IMPRESSION: Sigmoid diverticulosis with changes of active diverticulitis. No renal or ureteral stones.  No hydronephrosis.  Depression screen PHQ 2/9 11/24/2016  Decreased Interest 0  Down, Depressed, Hopeless 0  PHQ - 2 Score 0    No Known Allergies Social History  Substance Use Topics  . Smoking status: Former Games developer  . Smokeless tobacco: Never Used  . Alcohol use Yes     Comment: rarely   Past Medical History:  Diagnosis Date  . Anxiety   . GERD (gastroesophageal reflux disease)   . History of chicken pox   . Hyperlipidemia   . Hypertension   . Tinnitus of both ears    Past Surgical History:  Procedure Laterality Date  . broken fingers    .  HERNIA REPAIR     Family History  Problem Relation Age of Onset  . Diabetes Mother   . Hyperlipidemia  Mother   . Hypertension Mother   . Heart disease Mother   . Cancer Father   . Hyperlipidemia Father   . Hypertension Father   . Other Father        kidney removal  . Heart disease Father   . Kidney disease Father   . Cancer Sister   . Hypertension Sister    Allergies as of 02/08/2017   No Known Allergies     Medication List       Accurate as of 02/08/17  5:08 PM. Always use your most recent med list.          ALPRAZolam 0.5 MG tablet Commonly known as:  XANAX Take 1 tablet (0.5 mg total) by mouth 2 (two) times daily as needed for anxiety.   ciprofloxacin 500 MG tablet Commonly known as:  CIPRO Take 1 tablet (500 mg total) by mouth 2 (two) times daily.   Ginkgo Biloba Complex 440 MG Caps Take 1 capsule by mouth daily.   meclizine 25 MG tablet Commonly known as:  ANTIVERT Take 25 mg by mouth 3 (three) times daily as needed for dizziness.   metroNIDAZOLE 500 MG tablet Commonly known as:  FLAGYL Take 1 tablet (500 mg total) by mouth 3 (three) times daily.   multivitamin tablet Take 1 tablet by mouth daily.   NEEDLE (DISP) 22 G 22G X 1" Misc Use to inject testosterone intramuscularly every 2 weeks.   Omega-3 1000 MG Caps Take by mouth. Take 4 g by mouth daily.   oxyCODONE-acetaminophen 5-325 MG tablet Commonly known as:  ROXICET Take 1 tablet by mouth every 6 (six) hours as needed for severe pain.   pantoprazole 40 MG tablet Commonly known as:  PROTONIX Take 1 tablet (40 mg total) by mouth daily.   sertraline 50 MG tablet Commonly known as:  ZOLOFT TAKE 1/2 TABLET BY MOUTH DAILY FOR THE FIRST 2 WEEKS, THEN TAKE 1 TABLET DAILY.   simvastatin 40 MG tablet Commonly known as:  ZOCOR Take 1 tablet (40 mg total) by mouth at bedtime.   testosterone cypionate 200 MG/ML injection Commonly known as:  DEPOTESTOSTERONE CYPIONATE Inject 1 mL (200 mg total) into the muscle every 14 (fourteen) days.   valsartan-hydrochlorothiazide 160-25 MG tablet Commonly known  as:  DIOVAN HCT Take 1 tablet by mouth daily.       All past medical history, surgical history, allergies, family history, immunizations andmedications were updated in the EMR today and reviewed under the history and medication portions of their EMR.     ROS: Negative, with the exception of above mentioned in HPI   Objective:  BP (!) 175/92 (BP Location: Right Arm, Patient Position: Sitting, Cuff Size: Large)   Pulse 84   Temp 98 F (36.7 C)   Resp 20   Wt 290 lb 4 oz (131.7 kg)   SpO2 96%   BMI 39.36 kg/m  Body mass index is 39.36 kg/m. Gen: Afebrile. No acute distress. Nontoxic in appearance, well developed, well nourished. Pleasant, obese Caucasian male. HENT: AT. [place]. MMM Eyes:Pupils Equal Round Reactive to light, Extraocular movements intact,  Conjunctiva without redness, discharge or icterus. CV: RRR Chest: CTAB, no wheeze or crackles.   Abd: Soft. Round/obese. Moderately tender diffusely, rather exquisitely tender left lower quadrant with deep palpation. ND. BS present. No Masses palpated. Negative heel jar test. No rebound,  mild guarding. MSK: No CVA tenderness bilaterally Skin: No rashes, purpura or petechiae. No skin changes of abdomen. Neuro: Normal gait. PERLA. EOMi. Alert. Oriented x3  Psych: Mildly anxious, otherwise Normal affect, dress and demeanor. Normal speech. Normal thought content and judgment.  No exam data present No results found. No results found for this or any previous visit (from the past 24 hour(s)).  Assessment/Plan: AMILLION MACCHIA is a 46 y.o. male present for OV for  Diverticulitis - Patient rather tender on exam today. He has not taken pain medication today. I suspect this is why his blood pressure is elevated, but he was encouraged to monitor his blood pressure. If elevated, and symptomatic he should go to the emergency room. Take the pain medication if needed to rule out pain as cause of elevated pressure. - Reviewed patient's CT scan and  lab work with him today in detail. - Patient seemed anxious, mostly wanting reassurance. He reports that although he has abdominal pain, the pain is less than initially. - Rest, hydrate. Clear liquid diet, advanced slowly to bland soft diet. Discussed dehydration, decreased nutrition can cause fatigue. - CBC w/Diff - Urinalysis, Routine w reflex microscopic - C-reactive protein - Continue follow-up scheduled next week with  Peggyann Juba, NP, as long as not worsening. - If increased dizziness, blurry vision, headache, increased abdominal pain, fever he is to be seen immediately in the emergency room. Discussed with him today we could perform lab work to make certain that the infection is not worsening, however we are limited in what we can do urgently in the outpatient setting. Discussed red flags with him today and he was provided with diverticulitis education. Patient understands we will not be able to contact him until tomorrow at best with his lab results. He will go to the emergency room if condition worsens.  Reviewed expectations re: course of current medical issues.  Discussed self-management of symptoms.  Outlined signs and symptoms indicating need for more acute intervention.  Patient verbalized understanding and all questions were answered.  Patient received an After-Visit Summary.   Note is dictated utilizing voice recognition software. Although note has been proof read prior to signing, occasional typographical errors still can be missed. If any questions arise, please do not hesitate to call for verification.   electronically signed by:  Felix Pacini, DO  [facility] [place]

## 2017-02-08 NOTE — Patient Instructions (Signed)
I will collect labs today to make sure infection is not worsening.  If worsening symptoms overnight then please go straight to ED.     Diverticulitis Diverticulitis is inflammation or infection of small pouches in your colon that form when you have a condition called diverticulosis. The pouches in your colon are called diverticula. Your colon, or large intestine, is where water is absorbed and stool is formed. Complications of diverticulitis can include:  Bleeding.  Severe infection.  Severe pain.  Perforation of your colon.  Obstruction of your colon. What are the causes? Diverticulitis is caused by bacteria. Diverticulitis happens when stool becomes trapped in diverticula. This allows bacteria to grow in the diverticula, which can lead to inflammation and infection. What increases the risk? People with diverticulosis are at risk for diverticulitis. Eating a diet that does not include enough fiber from fruits and vegetables may make diverticulitis more likely to develop. What are the signs or symptoms? Symptoms of diverticulitis may include:  Abdominal pain and tenderness. The pain is normally located on the left side of the abdomen, but may occur in other areas.  Fever and chills.  Bloating.  Cramping.  Nausea.  Vomiting.  Constipation.  Diarrhea.  Blood in your stool. How is this diagnosed? Your health care provider will ask you about your medical history and do a physical exam. You may need to have tests done because many medical conditions can cause the same symptoms as diverticulitis. Tests may include:  Blood tests.  Urine tests.  Imaging tests of the abdomen, including X-rays and CT scans. When your condition is under control, your health care provider may recommend that you have a colonoscopy. A colonoscopy can show how severe your diverticula are and whether something else is causing your symptoms. How is this treated? Most cases of diverticulitis are  mild and can be treated at home. Treatment may include:  Taking over-the-counter pain medicines.  Following a clear liquid diet.  Taking antibiotic medicines by mouth for 7-10 days. More severe cases may be treated at a hospital. Treatment may include:  Not eating or drinking.  Taking prescription pain medicine.  Receiving antibiotic medicines through an IV tube.  Receiving fluids and nutrition through an IV tube.  Surgery. Follow these instructions at home:  Follow your health care provider's instructions carefully.  Follow a full liquid diet or other diet as directed by your health care provider. After your symptoms improve, your health care provider may tell you to change your diet. He or she may recommend you eat a high-fiber diet. Fruits and vegetables are good sources of fiber. Fiber makes it easier to pass stool.  Take fiber supplements or probiotics as directed by your health care provider.  Only take medicines as directed by your health care provider.  Keep all your follow-up appointments. Contact a health care provider if:  Your pain does not improve.  You have a hard time eating food.  Your bowel movements do not return to normal. Get help right away if:  Your pain becomes worse.  Your symptoms do not get better.  Your symptoms suddenly get worse.  You have a fever.  You have repeated vomiting.  You have bloody or black, tarry stools. This information is not intended to replace advice given to you by your health care provider. Make sure you discuss any questions you have with your health care provider. Document Released: 06/15/2005 Document Revised: 02/11/2016 Document Reviewed: 07/31/2013 Elsevier Interactive Patient Education  2017 [employer]

## 2017-02-09 ENCOUNTER — Telehealth: Payer: Self-pay | Admitting: Family Medicine

## 2017-02-09 ENCOUNTER — Encounter: Payer: Self-pay | Admitting: Family

## 2017-02-09 LAB — URINALYSIS, ROUTINE W REFLEX MICROSCOPIC
BILIRUBIN URINE: NEGATIVE
Glucose, UA: NEGATIVE
Hgb urine dipstick: NEGATIVE
Ketones, ur: NEGATIVE
LEUKOCYTES UA: NEGATIVE
Nitrite: NEGATIVE
PROTEIN: NEGATIVE
SPECIFIC GRAVITY, URINE: 1.021 (ref 1.001–1.035)
pH: 6.5 (ref 5.0–8.0)

## 2017-02-09 LAB — C-REACTIVE PROTEIN: CRP: 13.9 mg/L — AB (ref <8.0)

## 2017-02-09 NOTE — Telephone Encounter (Signed)
Noted  

## 2017-02-09 NOTE — Telephone Encounter (Signed)
Spoke with patient reviewed information and instructions patient verbalized understanding. 

## 2017-02-09 NOTE — Telephone Encounter (Signed)
Please call pt: - Pt was called today already to inform him his labs are improving, which they are. I did think I would get the inflammatory marker back today but we did, so we need to call him again. This marker is mildly elevated, As expected given his diverticulitis flare.  - Since only mildly elevated and CBC improving, Continue current course as discussed. Of course if worsening over weekend he needs to go to ED immediately.

## 2017-02-09 NOTE — Telephone Encounter (Signed)
Patient notified and verbalized understanding. 

## 2017-02-09 NOTE — Telephone Encounter (Signed)
Please call pt: - Please reassure pt his labs are improving, his WBC count is normal now. Continue the medication as prescribed. Follow up with providers as scheduled. If worsening symptoms he should go to ED.

## 2017-02-10 ENCOUNTER — Other Ambulatory Visit: Payer: Self-pay | Admitting: Family Medicine

## 2017-02-10 DIAGNOSIS — F341 Dysthymic disorder: Secondary | ICD-10-CM

## 2017-02-10 NOTE — Telephone Encounter (Signed)
Last RX: 12/28/16, #30 Last OV: 02/06/17 acute visit Next OV: 02/15/17 f/u of acute visit UDS: no UDS or CSC on file.  Please advise?

## 2017-02-12 NOTE — Telephone Encounter (Signed)
Will defer to PCP

## 2017-02-13 MED ORDER — ALPRAZOLAM 0.5 MG PO TABS: 0.5000 mg | ORAL_TABLET | Freq: Two times a day (BID) | ORAL | 1 refills | Status: DC | PRN

## 2017-02-13 NOTE — Telephone Encounter (Signed)
Printed and signed, placed on AB's desk. Needs to sign CSC, make sure he is taking Zoloft daily. TY.

## 2017-02-15 ENCOUNTER — Ambulatory Visit (INDEPENDENT_AMBULATORY_CARE_PROVIDER_SITE_OTHER): Payer: 59 | Admitting: Family

## 2017-02-15 ENCOUNTER — Encounter: Payer: Self-pay | Admitting: Family

## 2017-02-15 VITALS — BP 131/63 | HR 68 | Temp 97.9°F | Resp 18 | Ht 72.0 in | Wt 282.6 lb

## 2017-02-15 DIAGNOSIS — F419 Anxiety disorder, unspecified: Secondary | ICD-10-CM | POA: Diagnosis not present

## 2017-02-15 DIAGNOSIS — I10 Essential (primary) hypertension: Secondary | ICD-10-CM | POA: Diagnosis not present

## 2017-02-15 DIAGNOSIS — K5792 Diverticulitis of intestine, part unspecified, without perforation or abscess without bleeding: Secondary | ICD-10-CM | POA: Diagnosis not present

## 2017-02-15 MED ORDER — CIPROFLOXACIN HCL 500 MG PO TABS: 500.0000 mg | ORAL_TABLET | Freq: Two times a day (BID) | ORAL | 0 refills | Status: DC

## 2017-02-15 MED ORDER — METRONIDAZOLE 500 MG PO TABS: 500.0000 mg | ORAL_TABLET | Freq: Three times a day (TID) | ORAL | 0 refills | Status: DC

## 2017-02-15 MED FILL — ALPRAZolam 0.5 MG TABS: 0.5 | 15 days supply | Qty: 30 | Fill #0

## 2017-02-15 MED FILL — metroNIDAZOLE 500 MG TABS: 500 | 3 days supply | Qty: 9 | Fill #0

## 2017-02-15 MED FILL — CIPROFLOXACIN HCL 500 MG TA: 500 | 3 days supply | Qty: 6 | Fill #0

## 2017-02-15 NOTE — Progress Notes (Signed)
Subjective:    Patient ID: Aaron HumphreyJohnny T [name], male    DOB: 01/16/1971, 46 y.o.   MRN: [id number]  HPI  Aaron [name] is a 46 yr old male who presents today for follow up of his diverticulitis. He was initially seen on 02/06/17 with c/o abdominal pain. CT abd/pelvis revealed diverticulitis. He was started on cipro/flagyl.  He was then seen by Dr. Claiborne BillingsKuneff on 02/08/17. WBC had returned to normal at that time (down to 9.1 from 11.1). He was advised to continue his antibiotics and to follow back up with us a scheduled. BP was noted to be elevated that day and this was attributed to his pain.    He reports that he has mild LLQ pain which is intermittent and mild. Notes some mildly loose stools but more formed than they were initially. Denies BRBPR. Denies fever.     Review of Systems See HPI  Past Medical History:  Diagnosis Date  . Anxiety   . GERD (gastroesophageal reflux disease)   . History of chicken pox   . Hyperlipidemia   . Hypertension   . Tinnitus of both ears      Social History   Social History  . Marital status: Married    Spouse name: N/A  . Number of children: N/A  . Years of education: N/A   Occupational History  . Not on file.   Social History Main Topics  . Smoking status: Former Games developermoker  . Smokeless tobacco: Never Used  . Alcohol use Yes     Comment: rarely  . Drug use: No  . Sexual activity: Not on file   Other Topics Concern  . Not on file   Social History Narrative  . No narrative on file    Past Surgical History:  Procedure Laterality Date  . broken fingers    . HERNIA REPAIR      Family History  Problem Relation Age of Onset  . Diabetes Mother   . Hyperlipidemia Mother   . Hypertension Mother   . Heart disease Mother   . Cancer Father   . Hyperlipidemia Father   . Hypertension Father   . Other Father        kidney removal  . Heart disease Father   . Kidney disease Father   . Cancer Sister   . Hypertension Sister     No Known  Allergies  Current Outpatient Prescriptions on File Prior to Visit  Medication Sig Dispense Refill  . ALPRAZolam (XANAX) 0.5 MG tablet Take 1 tablet (0.5 mg total) by mouth 2 (two) times daily as needed for anxiety. 30 tablet 1  . ciprofloxacin (CIPRO) 500 MG tablet Take 1 tablet (500 mg total) by mouth 2 (two) times daily. 14 tablet 0  . Ginkgo Biloba Complex 440 MG CAPS Take 1 capsule by mouth daily.    . meclizine (ANTIVERT) 25 MG tablet Take 25 mg by mouth 3 (three) times daily as needed for dizziness.    . metroNIDAZOLE (FLAGYL) 500 MG tablet Take 1 tablet (500 mg total) by mouth 3 (three) times daily. 30 tablet 0  . Multiple Vitamin (MULTIVITAMIN) tablet Take 1 tablet by mouth daily.    Marland Kitchen. NEEDLE, DISP, 22 G 22G X 1" MISC Use to inject testosterone intramuscularly every 2 weeks. 25 each 0  . Omega-3 1000 MG CAPS Take by mouth. Take 4 g by mouth daily.    Marland Kitchen. oxyCODONE-acetaminophen (ROXICET) 5-325 MG tablet Take 1 tablet by mouth every 6 (six)  hours as needed for severe pain. 20 tablet 0  . pantoprazole (PROTONIX) 40 MG tablet Take 1 tablet (40 mg total) by mouth daily. 90 tablet 1  . sertraline (ZOLOFT) 50 MG tablet TAKE 1/2 TABLET BY MOUTH DAILY FOR THE FIRST 2 WEEKS, THEN TAKE 1 TABLET DAILY. 60 tablet 0  . simvastatin (ZOCOR) 40 MG tablet Take 1 tablet (40 mg total) by mouth at bedtime. 90 tablet 1  . testosterone cypionate (DEPOTESTOSTERONE CYPIONATE) 200 MG/ML injection Inject 1 mL (200 mg total) into the muscle every 14 (fourteen) days. 10 mL 0  . valsartan-hydrochlorothiazide (DIOVAN HCT) 160-25 MG tablet Take 1 tablet by mouth daily. 30 tablet 2   No current facility-administered medications on file prior to visit.     BP 131/63 (BP Location: Right Arm, Cuff Size: Large)   Pulse 68   Temp 97.9 F (36.6 C) (Oral)   Resp 18   Ht 6' (1.829 m)   Wt 282 lb 9.6 oz (128.2 kg)   SpO2 98%   BMI 38.33 kg/m       Objective:   Physical Exam  Constitutional: He is oriented to  person, place, and time. He appears well-developed and well-nourished. No distress.  HENT:  Head: Normocephalic and atraumatic.  Cardiovascular: Normal rate and regular rhythm.   No murmur heard. Pulmonary/Chest: Effort normal and breath sounds normal. No respiratory distress. He has no wheezes. He has no rales.  Abdominal: Soft. Bowel sounds are normal. He exhibits no distension. There is no rebound and no guarding.  Mild RLQ tenderness  Musculoskeletal: He exhibits no edema.  Neurological: He is alert and oriented to person, place, and time.  Skin: Skin is warm and dry.  Psychiatric: He has a normal mood and affect. His behavior is normal. Thought content normal.          Assessment & Plan:  Diverticulitis- clinically improved.  Tolerating regular diet.  Since he still has some mild RLQ tenderness, I will continue his abx for 3 additional days. He is advised to call if symptoms worsen or if symptoms do not continue to improve.  Follow up as scheduled with Dr. Carmelia Roller.   HTN- BP is improved.  Continue current meds.    Anxiety- UDS is obtained and a controlled substance contract is signed today.

## 2017-02-15 NOTE — Patient Instructions (Addendum)
Please continue cipro and metronidazole for 3 more days. Call if symptoms worsen or if symptoms do not continue to improve.  Follow up as scheduled with Dr. [name].

## 2017-02-15 NOTE — Telephone Encounter (Signed)
Pt in office today. He confirms that he is taking zoloft every day and continues to have anxiety. Reports increased anxiety around his recent diverticulitis flare up. Pt signed CSC and provided UDS today. Rx given to pt.

## 2017-02-16 ENCOUNTER — Encounter: Payer: Self-pay | Admitting: Family Medicine

## 2017-03-01 ENCOUNTER — Encounter: Payer: Self-pay | Admitting: Family Medicine

## 2017-03-01 ENCOUNTER — Encounter: Payer: Self-pay | Admitting: Gastroenterology

## 2017-03-01 ENCOUNTER — Ambulatory Visit (INDEPENDENT_AMBULATORY_CARE_PROVIDER_SITE_OTHER): Payer: 59 | Admitting: Family Medicine

## 2017-03-01 VITALS — BP 132/76 | HR 87 | Temp 97.9°F | Ht 72.0 in | Wt 287.2 lb

## 2017-03-01 DIAGNOSIS — E669 Obesity, unspecified: Secondary | ICD-10-CM | POA: Diagnosis not present

## 2017-03-01 DIAGNOSIS — R1031 Right lower quadrant pain: Secondary | ICD-10-CM | POA: Diagnosis not present

## 2017-03-01 DIAGNOSIS — I10 Essential (primary) hypertension: Secondary | ICD-10-CM

## 2017-03-01 DIAGNOSIS — F341 Dysthymic disorder: Secondary | ICD-10-CM

## 2017-03-01 MED ORDER — SERTRALINE HCL 100 MG PO TABS: 100.0000 mg | ORAL_TABLET | Freq: Every day | ORAL | 3 refills | Status: DC

## 2017-03-01 NOTE — Patient Instructions (Addendum)
Please consider counseling. The medical literature and evidence-based guidelines support it. Contact [phone] to schedule an appointment or inquire about cost/insurance coverage.  Your blood pressure looks great today. Keep up the great work.   If you do not hear anything about your referral in the next 1-2 weeks, call our office and ask for an update.

## 2017-03-01 NOTE — Progress Notes (Signed)
Chief Complaint  Patient presents with  . Follow-up    2 mos on HTN and Wt    Subjective Aaron [name] is a 46 y.o. male who presents for hypertension follow up. He does not routinely monitor home blood pressures. He is compliant with medications- Diovan 160-25 mg daily, recently started on Metoprolol by cardiologist. Patient has these side effects of medication: none He is not adhering to a healthy diet overall. 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 He was treated for antibiotics for diverticulitis. He is not having any fevers, nausea, vomiting, diarrhea, or blood in stool, but he is having some nighttime awakenings. No weight loss.  He feels somewhat better after taking the Zoloft routinely. He is tolerated the medicine well and reports compliance this time. He denies any homicidal ideations. He admits to self-medicating with Adderall because it likes away and makes him feel. He admits that this is likely not the healthiest option for him, particularly given his heart health. He will have daily thoughts of harming himself, however he is ever carry this out. He has a good support system with his family and states that this passes quickly. He does not have a plan. He does not follow with a counselor.  Past Medical History:  Diagnosis Date  . Anxiety   . GERD (gastroesophageal reflux disease)   . History of chicken pox   . Hyperlipidemia   . Hypertension   . Tinnitus of both ears    Family History  Problem Relation Age of Onset  . Diabetes Mother   . Hyperlipidemia Mother   . Hypertension Mother   . Heart disease Mother   . Cancer Father   . Hyperlipidemia Father   . Hypertension Father   . Other Father        kidney removal  . Heart disease Father   . Kidney disease Father   . Cancer Sister   . Hypertension Sister      Medications Current Outpatient Prescriptions on File Prior to Visit   Medication Sig Dispense Refill  . ALPRAZolam (XANAX) 0.5 MG tablet Take 1 tablet (0.5 mg total) by mouth 2 (two) times daily as needed for anxiety. 30 tablet 1  . Ginkgo Biloba Complex 440 MG CAPS Take 1 capsule by mouth daily.    . meclizine (ANTIVERT) 25 MG tablet Take 25 mg by mouth 3 (three) times daily as needed for dizziness.    . Multiple Vitamin (MULTIVITAMIN) tablet Take 1 tablet by mouth daily.    Marland Kitchen NEEDLE, DISP, 22 G 22G X 1" MISC Use to inject testosterone intramuscularly every 2 weeks. 25 each 0  . Omega-3 1000 MG CAPS Take by mouth. Take 4 g by mouth daily.    . pantoprazole (PROTONIX) 40 MG tablet Take 1 tablet (40 mg total) by mouth daily. 90 tablet 1  . sertraline (ZOLOFT) 50 MG tablet TAKE 1/2 TABLET BY MOUTH DAILY FOR THE FIRST 2 WEEKS, THEN TAKE 1 TABLET DAILY. 60 tablet 0  . testosterone cypionate (DEPOTESTOSTERONE CYPIONATE) 200 MG/ML injection Inject 1 mL (200 mg total) into the muscle every 14 (fourteen) days. 10 mL 0  . valsartan-hydrochlorothiazide (DIOVAN HCT) 160-25 MG tablet Take 1 tablet by mouth daily. 30 tablet 2   Allergies No Known Allergies  Review of Systems Cardiovascular: no chest pain Respiratory:  no shortness of breath  Exam BP 132/76 (BP Location: Left Arm, Patient Position: Sitting, Cuff  Size: Large)   Pulse 87   Temp 97.9 F (36.6 C) (Oral)   Ht 6' (1.829 m)   Wt 287 lb 3.2 oz (130.3 kg)   SpO2 98%   BMI 38.95 kg/m  General:  well developed, well nourished, in no apparent distress Skin:  warm, no pallor or diaphoresis Eyes:  pupils equal and round, sclera anicteric without injection Heart :RRR, no murmurs, no bruits, no LE edema Lungs:  clear to auscultation, no accessory muscle use Abd: BS+, soft, TTP in umbilical and RLQ, no guarding, no masses or organomegaly (difficult to palpate due to large panniculus), neg Carnett's Psych: well oriented with normal range of affect and appropriate judgment/insight, did become tearful during  appt  ANXIETY DEPRESSION - Plan: sertraline (ZOLOFT) 100 MG tablet  Right lower quadrant abdominal pain - Plan: Ambulatory referral to Gastroenterology  Obesity (BMI 30-39.9)  HYPERTENSION, BENIGN ESSENTIAL  Increased dose of Zoloft to 100 mg daily. Gave our number to contact for counseling, he has a resource through work that may be cheaper he is going to start with. Other options if this is not helpful is changing to different SSRI vs increase dose vs adding buproprion. Keep BP med regimen unchanged. Refer to GI for colonoscopy at age 46 and 2nd opinion regarding lingering RLQ pain. Current suicidal ideation and intent strongly denied. Discussed suicide hotlines, calling our office or calling his wife if he has such thoughts that do not immediately pass as usual. He denies having a plan. Denies having thoughts of harming others. He reinforced that he does have things to live for.  I also discussed his positive UDS for amphetamines which he admitted to. He states he will not do this again. I stated that if it happens again, I can no longer rx the Xanax. This is his first and only warning. Counseled on diet and exercise. F/u in 6 weeks. The patient voiced understanding and agreement to the plan.  Jilda Rocheicholas Paul UnionvilleWendling, DO 03/01/17  8:21 AM

## 2017-03-08 ENCOUNTER — Ambulatory Visit: Payer: 59 | Admitting: Family Medicine

## 2017-03-08 MED FILL — VALSARTAN-HCTZ 160-25 MG TA: 160-25 | 30 days supply | Qty: 30 | Fill #1

## 2017-03-08 MED FILL — PANTOPRAZOLE SOD DR 40 MG T: 40 | 30 days supply | Qty: 30 | Fill #1

## 2017-03-08 MED FILL — ALPRAZolam 0.5 MG TABS: 0.5 | 15 days supply | Qty: 30 | Fill #1

## 2017-03-10 ENCOUNTER — Encounter: Payer: Self-pay | Admitting: Gastroenterology

## 2017-03-10 ENCOUNTER — Ambulatory Visit (INDEPENDENT_AMBULATORY_CARE_PROVIDER_SITE_OTHER): Payer: 59 | Admitting: Gastroenterology

## 2017-03-10 VITALS — BP 144/84 | Ht 72.0 in | Wt 295.0 lb

## 2017-03-10 DIAGNOSIS — K5732 Diverticulitis of large intestine without perforation or abscess without bleeding: Secondary | ICD-10-CM

## 2017-03-10 DIAGNOSIS — R1084 Generalized abdominal pain: Secondary | ICD-10-CM | POA: Diagnosis not present

## 2017-03-10 DIAGNOSIS — K529 Noninfective gastroenteritis and colitis, unspecified: Secondary | ICD-10-CM | POA: Diagnosis not present
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
## 2017-03-10 NOTE — Patient Instructions (Signed)
If you are age 45 or older, your body mass index should be between 23-30. Your Body mass index is 40.01 kg/m. If this is out of the aforementioned range listed, please consider follow up with your Primary Care Provider.  If you are age 45 or younger, your body mass index should be between 19-25. Your Body mass index is 40.01 kg/m. If this is out of the aformentioned range listed, please consider follow up with your Primary Care Provider.   You have been scheduled for a colonoscopy. Please follow written instructions given to you at your visit today.  Please pick up your prep supplies at the pharmacy within the next 1-3 days. If you use inhalers (even only as needed), please bring them with you on the day of your procedure. Your physician has requested that you go to www.startemmi.com and enter the access code given to you at your visit today. This web site gives a general overview about your procedure. However, you should still follow specific instructions given to you by our office regarding your preparation for the procedure.  Thank you for choosing [facility]  Dr Amada JupiterHenry Danis III

## 2017-03-15 ENCOUNTER — Telehealth: Payer: Self-pay | Admitting: Family Medicine

## 2017-03-15 NOTE — Telephone Encounter (Signed)
Pt informed Aaron [name] up front that his BP on yesterday was (194/92) (R) arm (184/101).  He stated that he has an appt with Cardio on (03/21/17) to determine if out of work longer than that.//AB/CMA

## 2017-03-15 NOTE — Telephone Encounter (Signed)
Pt brought FMLA forms and echo results. Pt states he has to turn it in by 03/20/17 so he needs it before then. Call pt [phone]. Placed forms in front office tray.

## 2017-03-16 ENCOUNTER — Encounter: Payer: Self-pay | Admitting: Gastroenterology

## 2017-03-17 ENCOUNTER — Telehealth: Payer: Self-pay | Admitting: *Deleted

## 2017-03-17 ENCOUNTER — Telehealth: Payer: Self-pay | Admitting: Family Medicine

## 2017-03-17 NOTE — Telephone Encounter (Signed)
Caller name:Crystal Friia/Human Resources  Relationship to patient: Can be reached:[phone]  Pharmacy:  Reason for call:has questions regarding job duty duties on [employer]

## 2017-03-17 NOTE — Telephone Encounter (Signed)
FMLA paperwork had attachments ranging from GI issue [diverticulitis], Cardiology [abnormal EKG], Audiology [Hearing loss], Sleep apnes [Pulmonoloy], PTSD & Counseling [pt has not went to]; I have talked to Dr. [name] about this matter and have printed out last OV notes, as I am unsure as to which direction and/or medical issue is to be filed; forwarded to PCP/SLS 06/29 Orlene Ermonversation (Newest Message First)   03/15/17 5:16 PM  Verdie ShireBaynes, Angela M, CMA routed this conversation to Sharlene DoryWendling, Nicholas Paul, DO  Verdie ShireBaynes, Angela M, [facility] CountyCMA   03/15/17 5:15 PM  Note    Pt informed Baldo AshCarl up front that his BP on yesterday was (194/92) (R) arm (184/101).  He stated that he has an appt with Cardio on (03/21/17) to determine if out of work longer than that.//AB/CMA      03/15/17 12:09 PM  Lauralee EvenerBracken, Carla D routed this conversation to Verdie ShireBaynes, Angela M, CMA  Lauralee EvenerBracken, Carla D    03/15/17 12:08 PM  Note    Pt brought FMLA forms and echo results. Pt states he has to turn it in by 03/20/17 so he needs it before then. Call pt [phone]. Placed forms in front office tray.    Hulda HumphreyJohnny T. Martis  to Lauralee EvenerBracken, Carla D   03/15/17 12:07 PM  pt brought FMLA forms

## 2017-03-20 NOTE — Telephone Encounter (Signed)
Jasmine DecemberSharon will you call patient in Angie's absence?

## 2017-03-20 NOTE — Telephone Encounter (Signed)
I am running behind today and just seen message; I will ask Dr. [name] about the paperwork in the morning since I have not seen it since it was forwarded to PCP on 03/17/17/SLS 07/02 Conversation (Newest Message First)  ME  03/17/17 10:59 AM  Note    FMLA paperwork had attachments ranging from GI issue [diverticulitis], Cardiology [abnormal EKG], Audiology [Hearing loss], Sleep apnes [Pulmonoloy], PTSD & Counseling [pt has not went to]; I have talked to Dr. [name] about this matter and have printed out last OV notes, as I am unsure as to which direction and/or medical issue is to be filed; forwarded to PCP/SLS 06/29 Orlene Ermonversation (Newest Message First)   03/15/17 5:16 PM  Verdie ShireBaynes, Angela M, CMA routed this conversation to Sharlene DoryWendling, Nicholas Paul, DO  Verdie ShireBaynes, Angela M, [address] [facility]   03/15/17 5:15 PM  Note    Pt informed Baldo AshCarl up front that his BP on yesterday was (194/92) (R) arm (184/101). He stated that he has an appt with Cardio on (03/21/17) to determine if out of work longer than that.//AB/CMA      03/15/17 12:09 PM  Lauralee EvenerBracken, Carla D routed this conversation to Verdie ShireBaynes, Angela M, CMA  Lauralee EvenerBracken, Carla D    03/15/17 12:08 PM  Note    Pt brought FMLA forms and echo results. Pt states he has to turn it in by 03/20/17 so he needs it before then. Call pt [phone]. Placed forms in front office tray.    Hulda HumphreyJohnny T. Penalver  to Lauralee EvenerBracken, Carla D   03/15/17 12:07 PM  pt brought FMLA forms

## 2017-03-23 NOTE — Telephone Encounter (Signed)
Pt called in to make PCP aware that he now have a heart cathater. Pt says that he would like to pick up paperwork as soon as possible.    CB: [phone]

## 2017-03-24 NOTE — Telephone Encounter (Signed)
This was completed last week. Check Angie's file. We were supposed to have made a copy and called him for pick up. I can fill it out again if need be.

## 2017-03-24 NOTE — Telephone Encounter (Signed)
Found paperwork is at the front desk. Annice PihJackie is calling the patient to pickup paperwork.

## 2017-03-24 NOTE — Telephone Encounter (Signed)
Pt was informed document was filled out and ready to pick up. Pt will pick up document at front desk on Monday.

## 2017-03-24 NOTE — Telephone Encounter (Signed)
Paperwork ready, pt

## 2017-03-27 ENCOUNTER — Encounter: Payer: 59 | Admitting: Gastroenterology

## 2017-03-27 ENCOUNTER — Other Ambulatory Visit: Payer: Self-pay | Admitting: Family Medicine

## 2017-03-27 ENCOUNTER — Telehealth: Payer: Self-pay | Admitting: Gastroenterology

## 2017-03-27 NOTE — Telephone Encounter (Signed)
Patient canceled his appt for today 7.9.18 due to his pharmacy not being open on the weekends, so he did not pick up his prep. Patient reschedule to 7.19.18.

## 2017-04-03 ENCOUNTER — Telehealth: Payer: Self-pay | Admitting: Gastroenterology

## 2017-04-03 ENCOUNTER — Other Ambulatory Visit: Payer: Self-pay

## 2017-04-03 ENCOUNTER — Telehealth: Payer: Self-pay | Admitting: *Deleted

## 2017-04-03 MED ORDER — NA SULFATE-K SULFATE-MG SULF 17.5-3.13-1.6 GM/177ML PO SOLN: 1.0000 | Freq: Once | ORAL | 0 refills | Status: AC

## 2017-04-03 NOTE — Telephone Encounter (Signed)
OK 

## 2017-04-03 NOTE — Telephone Encounter (Signed)
Requesting Alprazolam 0.5mg -Take 1 tablet by mouth twice daily as needed for anxiety.                    Testosterone cypionate-Inject 1ml into the muscle every 14 days. Last refill:02/13/17;#30,1                01/25/17;5610ml,0 Last OV:03/01/17 Please advise.//AB/CMA

## 2017-04-03 NOTE — Telephone Encounter (Signed)
Rx sent as requested by the pt

## 2017-04-05 ENCOUNTER — Ambulatory Visit: Payer: 59 | Admitting: Gastroenterology

## 2017-04-06 ENCOUNTER — Encounter: Payer: 59 | Admitting: Gastroenterology

## 2017-04-12 ENCOUNTER — Ambulatory Visit: Payer: 59 | Admitting: Family Medicine

## 2017-04-12 DIAGNOSIS — Z0289 Encounter for other administrative examinations: Secondary | ICD-10-CM

## 2017-04-20 ENCOUNTER — Ambulatory Visit (INDEPENDENT_AMBULATORY_CARE_PROVIDER_SITE_OTHER): Payer: 59 | Admitting: Family Medicine

## 2017-04-20 ENCOUNTER — Encounter: Payer: Self-pay | Admitting: Family Medicine

## 2017-04-20 VITALS — BP 150/80 | HR 82 | Temp 97.6°F | Ht 72.0 in | Wt 289.2 lb

## 2017-04-20 DIAGNOSIS — Z0289 Encounter for other administrative examinations: Secondary | ICD-10-CM | POA: Diagnosis not present

## 2017-04-20 DIAGNOSIS — K219 Gastro-esophageal reflux disease without esophagitis: Secondary | ICD-10-CM | POA: Diagnosis not present

## 2017-04-20 DIAGNOSIS — F341 Dysthymic disorder: Secondary | ICD-10-CM

## 2017-04-20 DIAGNOSIS — I10 Essential (primary) hypertension: Secondary | ICD-10-CM | POA: Diagnosis not present

## 2017-04-20 LAB — LIPID PANEL
CHOL/HDL RATIO: 7
CHOLESTEROL: 302 mg/dL — AB (ref 0–200)
HDL: 41.1 mg/dL (ref >39.00)
NONHDL: 260.67
Triglycerides: 348 mg/dL — ABNORMAL HIGH (ref 0.0–149.0)
VLDL: 69.6 mg/dL — AB (ref 0.0–40.0)

## 2017-04-20 LAB — HEMOGLOBIN A1C: HEMOGLOBIN A1C: 6.3 % (ref 4.6–6.5)

## 2017-04-20 LAB — LDL CHOLESTEROL, DIRECT: Direct LDL: 185 mg/dL

## 2017-04-20 MED ORDER — ATORVASTATIN CALCIUM 80 MG PO TABS: 80.0000 mg | ORAL_TABLET | Freq: Every day | ORAL | 3 refills | Status: DC

## 2017-04-20 MED ORDER — FLUOXETINE HCL 20 MG PO TABS: 20.0000 mg | ORAL_TABLET | Freq: Every day | ORAL | 3 refills | Status: DC

## 2017-04-20 MED ORDER — LOSARTAN POTASSIUM-HCTZ 100-25 MG PO TABS: 1.0000 | ORAL_TABLET | Freq: Every day | ORAL | 1 refills | Status: DC

## 2017-04-20 MED ORDER — PANTOPRAZOLE SODIUM 40 MG PO TBEC: 40.0000 mg | DELAYED_RELEASE_TABLET | Freq: Every day | ORAL | 1 refills | Status: DC

## 2017-04-20 MED ORDER — ALPRAZOLAM 0.5 MG PO TABS: 0.5000 mg | ORAL_TABLET | Freq: Two times a day (BID) | ORAL | 1 refills | Status: DC | PRN

## 2017-04-20 MED ORDER — METOPROLOL SUCCINATE ER 50 MG PO TB24: 50.0000 mg | ORAL_TABLET | Freq: Every day | ORAL | 1 refills | Status: DC

## 2017-04-20 MED ORDER — "NEEDLE (DISP) 22G X 1"" MISC": 0 refills | Status: DC

## 2017-04-20 MED ORDER — TESTOSTERONE CYPIONATE 200 MG/ML IM SOLN: 200.0000 mg | INTRAMUSCULAR | 0 refills | Status: DC

## 2017-04-20 MED FILL — PANTOPRAZOLE SOD DR 40 MG T: 40 | 30 days supply | Qty: 30 | Fill #0

## 2017-04-20 MED FILL — LOSARTAN-HCTZ 100-25 MG TAB: 100-25 | 30 days supply | Qty: 30 | Fill #0

## 2017-04-20 MED FILL — METOPROLOL SUCC ER 50 MG TA: 50 | 30 days supply | Qty: 30 | Fill #0

## 2017-04-20 MED FILL — ALPRAZolam 0.5 MG TABS: 0.5 | 15 days supply | Qty: 30 | Fill #0

## 2017-04-20 NOTE — Progress Notes (Signed)
Chief Complaint  Patient presents with  . Patient want to discuss short disabiltiy form    Subjective: Patient is a 46 y.o. male here for discussion of short term disability form.  Pt has fam hx of heart disease and he himself has issues with HLD, HTN, and morbid obesity. He was referred to a cardiologist and their office would like to see if I would fill it out. He does have a form for our office to fill out regarding health screening.  PTSD/Anxiety/Depression The patient has been battling with this issue for many years. Recently, he finally started to become compliant with his sertraline. He has not noticed any difference on 100 mg daily. He has not been using Xanax recently, 0 uses in the past 11 days. He has not yet set up through counseling through his work. He is hesitant to do this based off of a previous bad experience. No current suicidal or homicidal ideation. He is no longer self-medicating with any prescription or illicit substances.  Hypertension Patient presents for hypertension follow up. He does sometimes monitor home blood pressures. Blood pressures ranging on average from 150-180's/90-100's. He is compliant with medications- Dionvan 160-25 mg daily, Metoprolol XR 50 mg daily. Patient has these side effects of medication: none He is sometimes adhering to a healthy diet overall. Exercise: walking  ROS: Heart: Denies chest pain  Lungs: Denies SOB   Family History  Problem Relation Age of Onset  . Diabetes Mother   . Hyperlipidemia Mother   . Hypertension Mother   . Heart disease Mother   . Cancer Father   . Hyperlipidemia Father   . Hypertension Father   . Other Father        kidney removal  . Heart disease Father   . Kidney disease Father   . Cancer Sister   . Hypertension Sister    Past Medical History:  Diagnosis Date  . Anxiety   . GERD (gastroesophageal reflux disease)   . History of chicken pox   . Hyperlipidemia   . Hypertension   . Tinnitus of  both ears    No Known Allergies  Current Outpatient Prescriptions:  .  ALPRAZolam (XANAX) 0.5 MG tablet, Take 1 tablet (0.5 mg total) by mouth 2 (two) times daily as needed for anxiety., Disp: 30 tablet, Rfl: 1 .  atorvastatin (LIPITOR) 80 MG tablet, Take 1 tablet (80 mg total) by mouth daily., Disp: 90 tablet, Rfl: 3 .  Ginkgo Biloba Complex 440 MG CAPS, Take 1 capsule by mouth daily., Disp: , Rfl:  .  meclizine (ANTIVERT) 25 MG tablet, Take 25 mg by mouth 3 (three) times daily as needed for dizziness., Disp: , Rfl:  .  metoprolol succinate (TOPROL-XL) 50 MG 24 hr tablet, Take 1 tablet (50 mg total) by mouth daily., Disp: 90 tablet, Rfl: 1 .  Multiple Vitamin (MULTIVITAMIN) tablet, Take 1 tablet by mouth daily., Disp: , Rfl:  .  NEEDLE, DISP, 22 G 22G X 1" MISC, Use to inject testosterone intramuscularly every 2 weeks., Disp: 25 each, Rfl: 0 .  Omega-3 1000 MG CAPS, Take by mouth. Take 4 g by mouth daily., Disp: , Rfl:  .  pantoprazole (PROTONIX) 40 MG tablet, Take 1 tablet (40 mg total) by mouth daily., Disp: 90 tablet, Rfl: 1 .  testosterone cypionate (DEPOTESTOSTERONE CYPIONATE) 200 MG/ML injection, Inject 1 mL (200 mg total) into the muscle every 14 (fourteen) days., Disp: 10 mL, Rfl: 0 .  FLUoxetine (PROZAC) 20 MG tablet, Take  1 tablet (20 mg total) by mouth daily., Disp: 30 tablet, Rfl: 3 .  losartan-hydrochlorothiazide (HYZAAR) 100-25 MG tablet, Take 1 tablet by mouth daily., Disp: 90 tablet, Rfl: 1  Objective: BP (!) 150/80 (BP Location: Left Arm, Patient Position: Sitting, Cuff Size: Large)   Pulse 82   Temp 97.6 F (36.4 C) (Oral)   Ht 6' (1.829 m)   Wt 289 lb 3.2 oz (131.2 kg)   SpO2 97%   BMI 39.22 kg/m  General: Awake, appears stated age Heart: RRR Lungs: No accessory muscle use Psych: Age appropriate judgment and insight, overall normal affect and mood   Assessment and Plan: Encounter for completion of form with patient - Plan: Lipid Profile, Hemoglobin A1c  ANXIETY  DEPRESSION - Plan: FLUoxetine (PROZAC) 20 MG tablet, ALPRAZolam (XANAX) 0.5 MG tablet, DISCONTINUED: FLUoxetine (PROZAC) 20 MG tablet  Gastroesophageal reflux disease, esophagitis presence not specified - Plan: pantoprazole (PROTONIX) 40 MG tablet  I will fill out form for work asking for cholesterol, height, wt, etc. Will obtain labs. Defer disability form to cardiology team as their dx and recs are reason for disability. Change Zoloft to Prozac. If this is not helpful, will try SNRI class. Number for counseling given. Psych resources also given. I did state the importance of taking this daily in addition to getting set up with counseling services.  Recheck of BP also elevated. Will change Diovan to Hyzaar given recall. If still elevated, will increase dose of Metoprolol. F/u in 1 mo to recheck BP and how he is doing on Zoloft. The patient voiced understanding and agreement to the plan.  Greater than 25 minutes were spent face to face with the patient with greater than 50% of this time spent counseling on diet/exercise, treatment/prognosis of PTSD/anxiety/depression, treatment of HTN, and prognosis/f/u.    Jilda Rocheicholas Paul WarrentonWendling, DO 04/20/17  8:24 AM

## 2017-04-20 NOTE — Patient Instructions (Signed)
Please consider counseling. The medical literature and evidence-based guidelines support it. Contact [phone] to schedule an appointment or inquire about cost/insurance coverage.  Here are some options for psychiatry if you feel the need to get set up: [facility] [address] [place], [place] [address] [phone]  [facility] [address] [place], [place] [address] [phone]  [facility] [address] [place], [place] [address] [phone]  Dr. Andee PolesParish McKinney [address] [place], [place] [address] [phone]

## 2017-04-24 ENCOUNTER — Telehealth: Payer: Self-pay | Admitting: *Deleted

## 2017-04-24 NOTE — Telephone Encounter (Signed)
Advised pt that form was faxed/he requested a copy to be made available for him at front desk for him to P/U/completed/thx dmf,rma/AB/CMA

## 2017-05-05 ENCOUNTER — Telehealth: Payer: Self-pay | Admitting: *Deleted

## 2017-05-05 DIAGNOSIS — E785 Hyperlipidemia, unspecified: Secondary | ICD-10-CM

## 2017-05-05 DIAGNOSIS — R7303 Prediabetes: Secondary | ICD-10-CM

## 2017-05-05 NOTE — Telephone Encounter (Signed)
-----   Message from Sharlene Dory, DO sent at 04/20/2017  7:55 PM EDT ----- Noted. MyChart message sent to patient.  AB- please schedule pt for lab visit for Lipid panel and A1c dx hyperlipidemia and prediabetes respectively. I would like him fasting. TY.

## 2017-05-05 NOTE — Telephone Encounter (Signed)
Refilled done.//AB/CMA 

## 2017-05-05 NOTE — Telephone Encounter (Signed)
Called and [facility] @ 10:43am @ [phone]) asking the pt to give the office a call to schedule a lab appt for fasting labs.  Asked the pt to be fasting(8-12) for this labs.  Asked the pt to give me a call back if he has any questions.  Future labs ordered and sent.//AB/CMA

## 2017-05-10 MED FILL — FLUoxetine HCL 20 MG CAPS: 20 | 30 days supply | Qty: 30 | Fill #0

## 2017-05-12 MED FILL — ALPRAZolam 0.5 MG TABS: 0.5 | 15 days supply | Qty: 30 | Fill #1

## 2017-05-17 ENCOUNTER — Ambulatory Visit: Payer: 59 | Admitting: Psychology

## 2017-05-18 MED FILL — TESTOSTERONE CYP 200 MG/ML: 200 | 27 days supply | Qty: 2 | Fill #0

## 2017-05-23 MED FILL — METOPROLOL SUCC ER 50 MG TA: 50 | 30 days supply | Qty: 30 | Fill #1

## 2017-05-23 MED FILL — LOSARTAN-HCTZ 100-25 MG TAB: 100-25 | 30 days supply | Qty: 30 | Fill #1

## 2017-05-23 MED FILL — PANTOPRAZOLE SOD DR 40 MG T: 40 | 30 days supply | Qty: 30 | Fill #1

## 2017-05-24 ENCOUNTER — Ambulatory Visit: Payer: 59 | Admitting: Family Medicine

## 2017-05-26 ENCOUNTER — Encounter: Payer: Self-pay | Admitting: Gastroenterology

## 2017-06-02 ENCOUNTER — Ambulatory Visit: Payer: Self-pay | Admitting: Family Medicine

## 2017-06-02 ENCOUNTER — Ambulatory Visit: Payer: Self-pay | Admitting: Psychology

## 2017-06-06 ENCOUNTER — Emergency Department (HOSPITAL_BASED_OUTPATIENT_CLINIC_OR_DEPARTMENT_OTHER): Payer: 59

## 2017-06-06 ENCOUNTER — Emergency Department (HOSPITAL_BASED_OUTPATIENT_CLINIC_OR_DEPARTMENT_OTHER)
Admission: EM | Admit: 2017-06-06 | Discharge: 2017-06-06 | Disposition: A | Discharge status: Home / Self Care | Payer: 59 | Attending: Emergency Medicine | Admitting: Emergency Medicine

## 2017-06-06 DIAGNOSIS — Z87891 Personal history of nicotine dependence: Secondary | ICD-10-CM | POA: Insufficient documentation

## 2017-06-06 DIAGNOSIS — I10 Essential (primary) hypertension: Secondary | ICD-10-CM | POA: Diagnosis not present

## 2017-06-06 DIAGNOSIS — Z79899 Other long term (current) drug therapy: Secondary | ICD-10-CM | POA: Diagnosis not present

## 2017-06-06 DIAGNOSIS — R1013 Epigastric pain: Secondary | ICD-10-CM | POA: Diagnosis not present

## 2017-06-06 DIAGNOSIS — R002 Palpitations: Secondary | ICD-10-CM

## 2017-06-06 LAB — COMPREHENSIVE METABOLIC PANEL
ALK PHOS: 58 U/L (ref 38–126)
ALT: 32 U/L (ref 17–63)
ANION GAP: 13 (ref 5–15)
AST: 28 U/L (ref 15–41)
Albumin: 4.4 g/dL (ref 3.5–5.0)
BUN: 16 mg/dL (ref 6–20)
CALCIUM: 8.9 mg/dL (ref 8.9–10.3)
CO2: 27 mmol/L (ref 22–32)
Chloride: 90 mmol/L — ABNORMAL LOW (ref 101–111)
Creatinine, Ser: 1.23 mg/dL (ref 0.61–1.24)
Glucose, Bld: 161 mg/dL — ABNORMAL HIGH (ref 65–99)
Potassium: 3.4 mmol/L — ABNORMAL LOW (ref 3.5–5.1)
SODIUM: 130 mmol/L — AB (ref 135–145)
TOTAL PROTEIN: 8.7 g/dL — AB (ref 6.5–8.1)
Total Bilirubin: 0.7 mg/dL (ref 0.3–1.2)

## 2017-06-06 LAB — CBC WITH DIFFERENTIAL/PLATELET
BASOS ABS: 0.1 10*3/uL (ref 0.0–0.1)
BASOS PCT: 1 %
EOS ABS: 0.1 10*3/uL (ref 0.0–0.7)
Eosinophils Relative: 1 %
HCT: 45.4 % (ref 39.0–52.0)
HEMOGLOBIN: 16 g/dL (ref 13.0–17.0)
Lymphocytes Relative: 24 %
Lymphs Abs: 2.6 10*3/uL (ref 0.7–4.0)
MCH: 30.6 pg (ref 26.0–34.0)
MCHC: 35.2 g/dL (ref 30.0–36.0)
MCV: 86.8 fL (ref 78.0–100.0)
MONOS PCT: 9 %
Monocytes Absolute: 0.9 10*3/uL (ref 0.1–1.0)
NEUTROS ABS: 6.9 10*3/uL (ref 1.7–7.7)
Neutrophils Relative %: 65 %
Platelets: 436 10*3/uL — ABNORMAL HIGH (ref 150–400)
RBC: 5.23 MIL/uL (ref 4.22–5.81)
RDW: 13.6 % (ref 11.5–15.5)
WBC: 10.6 10*3/uL — AB (ref 4.0–10.5)

## 2017-06-06 LAB — LIPASE, BLOOD: LIPASE: 29 U/L (ref 11–51)

## 2017-06-06 LAB — TROPONIN I: Troponin I: <0.03 ng/mL (ref <0.03)

## 2017-06-06 MED ORDER — GI COCKTAIL ~~LOC~~
30.0000 mL | Freq: Once | ORAL | Status: AC
  Administered 2017-06-06: 30 mL via ORAL
  Filled 2017-06-06: qty 30

## 2017-06-06 MED ORDER — SODIUM CHLORIDE 0.9 % IV BOLUS (SEPSIS)
1000.0000 mL | Freq: Once | INTRAVENOUS | Status: AC
  Administered 2017-06-06: 1000 mL via INTRAVENOUS

## 2017-06-06 MED ORDER — SUCRALFATE 1 G PO TABS
1.0000 g | ORAL_TABLET | Freq: Once | ORAL | Status: AC
  Administered 2017-06-06: 1 g via ORAL
  Filled 2017-06-06: qty 1

## 2017-06-06 NOTE — ED Notes (Signed)
Pt reports taking what he believes is 4 adderall today around 4pm, pt starting feeling palpitations around 11pm.

## 2017-06-06 NOTE — ED Provider Notes (Signed)
[facility] Provider Note   CSN: [id number] Arrival date & time: 06/06/17  0026     History   Chief Complaint Chief Complaint  Patient presents with  . Palpitations    HPI Aaron [name] is a 46 y.o. male.  HPI  This 46 year old male who presents with palpitations and upper abdominal pain. Patient reports onset of symptoms at 14 PM. He feels like his heart was racing. He denies chest pain or shortness of breath. He reports upper abdominal fullness and discomfort that radiates upward. Reports frequent belching. Denies any nausea, vomiting, diarrhea. Denies any shortness of breath. Of note, patient took for Adderall at 4 PM. These were not prescribed to him and he is unsure of the dosage. He states that he has taken Adderall before without any ill effects. Patient also reports drinking 6 beers just today. Denies daily drinking.  Past Medical History:  Diagnosis Date  . Anxiety   . GERD (gastroesophageal reflux disease)   . History of chicken pox   . Hyperlipidemia   . Hypertension   . Tinnitus of both ears     Patient Active Problem List   Diagnosis Date Noted  . Sleep apnea 01/09/2012  . Hypogonadism male 01/02/2012  . History of blurred vision 12/30/2011  . Dizziness 12/21/2011  . Obesity (BMI 30-39.9) 11/23/2011  . Somnolence 11/23/2011  . Nonspecific abnormal electrocardiogram (ECG) (EKG) 11/23/2011  . Umbilical hernia 11/05/2010  . ANXIETY DEPRESSION 03/10/2010  . TMJ PAIN 01/12/2009  . PALPITATIONS 12/01/2008  . TINNITUS NOS 06/13/2007  . HYPERTENSION, BENIGN ESSENTIAL 06/13/2007    Past Surgical History:  Procedure Laterality Date  . broken fingers    . HERNIA REPAIR         Home Medications    Prior to Admission medications   Medication Sig Start Date End Date Taking? Authorizing Provider  ALPRAZolam Prudy Feeler) 0.5 MG tablet Take 1 tablet (0.5 mg total) by mouth 2 (two) times daily as needed for anxiety. 04/20/17   Sharlene Dory,  DO  atorvastatin (LIPITOR) 80 MG tablet Take 1 tablet (80 mg total) by mouth daily. 04/20/17   Sharlene Dory, DO  FLUoxetine (PROZAC) 20 MG tablet Take 1 tablet (20 mg total) by mouth daily. 04/20/17   Sharlene Dory, DO  Ginkgo Biloba Complex 440 MG CAPS Take 1 capsule by mouth daily.    [provider]  losartan-hydrochlorothiazide (HYZAAR) 100-25 MG tablet Take 1 tablet by mouth daily. 04/20/17   Sharlene Dory, DO  meclizine (ANTIVERT) 25 MG tablet Take 25 mg by mouth 3 (three) times daily as needed for dizziness.    [provider]  metoprolol succinate (TOPROL-XL) 50 MG 24 hr tablet Take 1 tablet (50 mg total) by mouth daily. 04/20/17   Sharlene Dory, DO  Multiple Vitamin (MULTIVITAMIN) tablet Take 1 tablet by mouth daily.    [provider]  NEEDLE, DISP, 22 G 22G X 1" MISC Use to inject testosterone intramuscularly every 2 weeks. 04/20/17   Sharlene Dory, DO  Omega-3 1000 MG CAPS Take by mouth. Take 4 g by mouth daily.    [provider]  pantoprazole (PROTONIX) 40 MG tablet Take 1 tablet (40 mg total) by mouth daily. 04/20/17   Sharlene Dory, DO  testosterone cypionate (DEPOTESTOSTERONE CYPIONATE) 200 MG/ML injection Inject 1 mL (200 mg total) into the muscle every 14 (fourteen) days. 04/20/17   Sharlene Dory, DO    Family History Family History  Problem Relation Age of Onset  . Diabetes Mother   . Hyperlipidemia Mother   . Hypertension Mother   . Heart disease Mother   . Cancer Father   . Hyperlipidemia Father   . Hypertension Father   . Other Father        kidney removal  . Heart disease Father   . Kidney disease Father   . Cancer Sister   . Hypertension Sister     Social History Social History  Substance Use Topics  . Smoking status: Former Games developer  . Smokeless tobacco: Never Used  . Alcohol use Yes     Comment: rarely     Allergies   Patient has no known  allergies.   Review of Systems Review of Systems  Constitutional: Negative for fever.  Respiratory: Negative for chest tightness and shortness of breath.   Cardiovascular: Positive for palpitations. Negative for chest pain and leg swelling.  Gastrointestinal: Positive for abdominal pain. Negative for diarrhea, nausea and vomiting.  Genitourinary: Negative for dysuria.  All other systems reviewed and are negative.    Physical Exam Updated Vital Signs BP (!) 160/85   Pulse (!) 107   Temp 98.1 F (36.7 C) (Oral)   Resp 17   Ht 6' (1.829 m)   Wt 131.5 kg (290 lb)   SpO2 99%   BMI 39.33 kg/m   Physical Exam  Constitutional: He is oriented to person, place, and time. No distress.  Overweight  HENT:  Head: Normocephalic and atraumatic.  Cardiovascular: Regular rhythm and normal heart sounds.   No murmur heard. Tachycardia  Pulmonary/Chest: Effort normal and breath sounds normal. No respiratory distress. He has no wheezes.  Abdominal: Soft. Bowel sounds are normal. There is tenderness. There is no rebound.  Epigastric tenderness palpation, no rebound or guarding  Musculoskeletal: He exhibits no edema.  Neurological: He is alert and oriented to person, place, and time.  Skin: Skin is warm and dry.  Psychiatric: He has a normal mood and affect.  Nursing note and vitals reviewed.    ED Treatments / Results  Labs (all labs ordered are listed, but only abnormal results are displayed) Labs Reviewed  CBC WITH DIFFERENTIAL/PLATELET - Abnormal; Notable for the following:       Result Value   WBC 10.6 (*)    Platelets 436 (*)    All other components within normal limits  COMPREHENSIVE METABOLIC PANEL - Abnormal; Notable for the following:    Sodium 130 (*)    Potassium 3.4 (*)    Chloride 90 (*)    Glucose, Bld 161 (*)    Total Protein 8.7 (*)    All other components within normal limits  TROPONIN I  LIPASE, BLOOD    EKG  EKG Interpretation  Date/Time:  Tuesday  June 06 2017 00:34:11 EDT Ventricular Rate:  117 PR Interval:  142 QRS Duration: 82 QT Interval:  338 QTC Calculation: 471 R Axis:   50 Text Interpretation:  Sinus tachycardia Otherwise normal ECG Confirmed by Ross Marcus ([id number]) on 06/06/2017 1:42:06 AM       Radiology Dg Chest 2 View  Result Date: 06/06/2017 CLINICAL DATA:  Chest pain EXAM: CHEST  2 VIEW COMPARISON:  01/26/2011 FINDINGS: The heart size and mediastinal contours are within normal limits. Both lungs are clear. The visualized skeletal structures are unremarkable. IMPRESSION: No active cardiopulmonary disease. Electronically Signed   By: Jasmine Pang M.D.   On: 06/06/2017 03:18    Procedures Procedures (including critical care  time)  Medications Ordered in ED Medications  sucralfate (CARAFATE) tablet 1 g (not administered)  sodium chloride 0.9 % bolus 1,000 mL (0 mLs Intravenous Stopped 06/06/17 0311)  gi cocktail (Maalox,Lidocaine,Donnatal) (30 mLs Oral Given 06/06/17 0210)     Initial Impression / Assessment and Plan / ED Course  I have reviewed the triage vital signs and the nursing notes.  Pertinent labs & imaging results that were available during my care of the patient were reviewed by me and considered in my medical decision making (see chart for details).     Patient presents with palpitations and upper abdominal discomfort. He is tachycardic on exam. On abdominal exam he has some tenderness of the epigastrium without rebound or guarding. Lab work obtained. Patient given fluids. EKG shows sinus tachycardia. Suspect tachycardia may be related to Adderall use earlier in the evening. He also reports alcohol use which could cause gastritis or worsening GERD symptoms. He was given a GI cocktail with some improvement of his symptoms. Lab work is all reassuring. Discussed with patient that he should not take medication that is not prescribed to him. Continue Protonix at home and avoid alcohol.  After  history, exam, and medical workup I feel the patient has been appropriately medically screened and is safe for discharge home. Pertinent diagnoses were discussed with the patient. Patient was given return precautions.   Final Clinical Impressions(s) / ED Diagnoses   Final diagnoses:  Palpitations  Epigastric pain    New Prescriptions New Prescriptions   No medications on file     Shon Baton, MD 06/06/17 [phone]

## 2017-06-06 NOTE — ED Triage Notes (Signed)
C/o palpitations and chest tightness 2/10. States he took "4 Adderall, I dont know the strength. I am not prescribed them". Reports "feeling funny".

## 2017-06-06 NOTE — Discharge Instructions (Signed)
You were seen today for palpitations and epigastric pain. Palpitations are likely related to Adderall use. He need to stop taking Adderall as this can cause increasing tachycardia. Regarding her epigastric pain, your workup is reassuring. He has a history of reflux. This could be related to gastritis or reflux. Continue pantoprazole daily.

## 2017-06-06 NOTE — ED Notes (Signed)
Patient transported to X-ray 

## 2017-06-08 ENCOUNTER — Ambulatory Visit: Payer: 59 | Admitting: Psychology

## 2017-06-15 ENCOUNTER — Ambulatory Visit: Payer: 59 | Admitting: Psychology

## 2017-06-16 ENCOUNTER — Ambulatory Visit: Payer: 59 | Admitting: Psychology

## 2017-06-21 MED FILL — AMLODIPINE BESYLATE 5 MG TA: 5 | 30 days supply | Qty: 30 | Fill #0

## 2017-06-26 ENCOUNTER — Ambulatory Visit (INDEPENDENT_AMBULATORY_CARE_PROVIDER_SITE_OTHER): Payer: 59 | Admitting: Family Medicine

## 2017-06-26 ENCOUNTER — Encounter: Payer: Self-pay | Admitting: Family Medicine

## 2017-06-26 VITALS — BP 182/82 | HR 121 | Temp 98.3°F | Ht 72.0 in | Wt 283.2 lb

## 2017-06-26 DIAGNOSIS — K219 Gastro-esophageal reflux disease without esophagitis: Secondary | ICD-10-CM | POA: Diagnosis not present

## 2017-06-26 DIAGNOSIS — F341 Dysthymic disorder: Secondary | ICD-10-CM

## 2017-06-26 MED ORDER — ATORVASTATIN CALCIUM 80 MG PO TABS: 80.0000 mg | ORAL_TABLET | Freq: Every day | ORAL | 1 refills | Status: DC

## 2017-06-26 MED ORDER — FLUOXETINE HCL 20 MG PO TABS: 20.0000 mg | ORAL_TABLET | Freq: Every day | ORAL | 1 refills | Status: DC

## 2017-06-26 MED ORDER — LOSARTAN POTASSIUM-HCTZ 100-25 MG PO TABS: 1.0000 | ORAL_TABLET | Freq: Every day | ORAL | 1 refills | Status: DC

## 2017-06-26 MED ORDER — "NEEDLE (DISP) 22G X 1"" MISC": 0 refills | Status: DC

## 2017-06-26 MED ORDER — FLUOXETINE HCL 10 MG PO TABS: 10.0000 mg | ORAL_TABLET | Freq: Every day | ORAL | 3 refills | Status: DC

## 2017-06-26 MED ORDER — PANTOPRAZOLE SODIUM 40 MG PO TBEC: 40.0000 mg | DELAYED_RELEASE_TABLET | Freq: Every day | ORAL | 1 refills | Status: DC

## 2017-06-26 MED ORDER — METOPROLOL SUCCINATE ER 50 MG PO TB24: 50.0000 mg | ORAL_TABLET | Freq: Every day | ORAL | 1 refills | Status: DC

## 2017-06-26 MED ORDER — TESTOSTERONE CYPIONATE 200 MG/ML IM SOLN: 200.0000 mg | INTRAMUSCULAR | 0 refills | Status: DC

## 2017-06-26 MED FILL — ATORVASTATIN 80 MG TABLET: 80 | 30 days supply | Qty: 30 | Fill #0

## 2017-06-26 MED FILL — FLUoxetine HCL 10 MG CAPS: 10 | 30 days supply | Qty: 30 | Fill #0

## 2017-06-26 MED FILL — LOSARTAN-HCTZ 100-25 MG TAB: 100-25 | 30 days supply | Qty: 30 | Fill #0

## 2017-06-26 MED FILL — FLUoxetine HCL 20 MG CAPS: 20 | 30 days supply | Qty: 30 | Fill #0

## 2017-06-26 MED FILL — BD NEEDLES 22GX1": 22G X 1" | 84 days supply | Qty: 6 | Fill #0

## 2017-06-26 MED FILL — PANTOPRAZOLE SOD DR 40 MG T: 40 | 30 days supply | Qty: 30 | Fill #0

## 2017-06-26 MED FILL — TESTOSTERONE CYP 200 MG/ML: 200 | 28 days supply | Qty: 2 | Fill #0

## 2017-06-26 MED FILL — BD NEEDLES 22GX1: 22G X 1" | 84 days supply | Qty: 6 | Fill #0

## 2017-06-26 MED FILL — METOPROLOL SUCC ER 50 MG TA: 50 | 30 days supply | Qty: 30 | Fill #0

## 2017-06-26 NOTE — Patient Instructions (Addendum)
[facility] [address] [place], [place] [address] [phone]  [facility] [address] [place], [place] [address] [phone]  [facility]   [address] [place], [place] [address] [phone]  Try to stay physically active.

## 2017-06-26 NOTE — Progress Notes (Signed)
Pre visit review using our clinic review tool, if applicable. No additional management support is needed unless otherwise documented below in the visit note. 

## 2017-06-26 NOTE — Progress Notes (Signed)
Chief Complaint  Patient presents with  . Follow-up    Subjective Aaron [name] presents for f/u anxiety/depression.  He is currently being treated with Prozac 20 mg daily and Xanax 0.5 mg bid prn. He was also rec'd to contact a therapist and was given psych resources. Reports regression since treatemnt. His wife is currently admitted in the psychiatric hospital due to poor sleep and an apparent attempt to commit suicide. His mother-in-law is accusing him of driving her, which he vehemently denies. These are causing him a significant amount of stress. He is not yet set up with a counselor. He has not called his psychiatrist. This is affecting him where he has difficulty working. He is unable to focus and perform the duties required of his job. Has failed Zoloft, Klonopin. No thoughts of harming self or others. + Self-medication with Adderall He is not following with a counselor/psychologist.  He is having palpitations, however will wore a long to monitor and it was found to be normal. He follows with cardiology. They are managing his blood pressure regimen and a recent change, however he is at pick up his new prescription, Norvasc.  ROS Psych: No homicidal or suicidal thoughts Cardiac: +palipations  Past Medical History:  Diagnosis Date  . Anxiety   . GERD (gastroesophageal reflux disease)   . History of chicken pox   . Hyperlipidemia   . Hypertension   . Tinnitus of both ears    Family History  Problem Relation Age of Onset  . Diabetes Mother   . Hyperlipidemia Mother   . Hypertension Mother   . Heart disease Mother   . Cancer Father   . Hyperlipidemia Father   . Hypertension Father   . Other Father        kidney removal  . Heart disease Father   . Kidney disease Father   . Cancer Sister   . Hypertension Sister    Allergies as of 06/26/2017   No Known Allergies     Medication List       Accurate as of 06/26/17  9:39 AM. Always use your most recent med list.          ALPRAZolam 0.5 MG tablet Commonly known as:  XANAX Take 1 tablet (0.5 mg total) by mouth 2 (two) times daily as needed for anxiety.   amLODipine 5 MG tablet Commonly known as:  NORVASC Take 5 mg by mouth daily.   atorvastatin 80 MG tablet Commonly known as:  LIPITOR Take 1 tablet (80 mg total) by mouth daily.   FLUoxetine 20 MG tablet Commonly known as:  PROZAC Take 1 tablet (20 mg total) by mouth daily.   FLUoxetine 10 MG tablet Commonly known as:  PROZAC Take 1 tablet (10 mg total) by mouth daily.   Ginkgo Biloba Complex 440 MG Caps Take 1 capsule by mouth daily.   losartan-hydrochlorothiazide 100-25 MG tablet Commonly known as:  HYZAAR Take 1 tablet by mouth daily.   meclizine 25 MG tablet Commonly known as:  ANTIVERT Take 25 mg by mouth 3 (three) times daily as needed for dizziness.   metoprolol succinate 50 MG 24 hr tablet Commonly known as:  TOPROL-XL Take 1 tablet (50 mg total) by mouth daily.   multivitamin tablet Take 1 tablet by mouth daily.   NEEDLE (DISP) 22 G 22G X 1" Misc Use to inject testosterone intramuscularly every 2 weeks.   Omega-3 1000 MG Caps Take by mouth. Take 4 g by mouth daily.  pantoprazole 40 MG tablet Commonly known as:  PROTONIX Take 1 tablet (40 mg total) by mouth daily.   testosterone cypionate 200 MG/ML injection Commonly known as:  DEPOTESTOSTERONE CYPIONATE Inject 1 mL (200 mg total) into the muscle every 14 (fourteen) days.       Exam BP (!) 182/82 (BP Location: Left Arm, Patient Position: Sitting, Cuff Size: Large)   Pulse (!) 121   Temp 98.3 F (36.8 C) (Oral)   Ht 6' (1.829 m)   Wt 283 lb 4 oz (128.5 kg)   SpO2 98%   BMI 38.42 kg/m  General:  well developed, well nourished, in no apparent distress Lungs: no access muscle use or resp distress Psych: well oriented with normal range of affect and age-appropriate judgement/insight, alert and oriented x4.; He did become tearful during the exam  Assessment  and Plan  ANXIETY DEPRESSION - Plan: FLUoxetine (PROZAC) 20 MG tablet  Gastroesophageal reflux disease, esophagitis presence not specified - Plan: pantoprazole (PROTONIX) 40 MG tablet  Orders as above. Will increase Prozac to 30 mg as he is noticing benefit.  He needs to contact our behavioral health team.  Counseled again self-medication. Contacts for psychiatry given. Encouraged him to call sooner than later. He is worried he will be involuntarily committed. I stated that this wouldn't be an issue unless he is suicidal or deemed to be a threat to himself or others. He denies suicidal ideation/intent and has no intention to harm others.  He would like [place] to give him some more time to mentally heal before going back to work. F/u in 4-5 weeks. The patient voiced understanding and agreement to the plan.  [name] Olmitz, DO 06/26/17 9:39 AM

## 2017-06-27 ENCOUNTER — Encounter: Payer: Self-pay | Admitting: Family Medicine

## 2017-06-28 ENCOUNTER — Telehealth: Payer: Self-pay | Admitting: Family Medicine

## 2017-06-28 DIAGNOSIS — F341 Dysthymic disorder: Secondary | ICD-10-CM

## 2017-06-28 MED ORDER — ALPRAZOLAM 0.5 MG PO TABS: 0.5000 mg | ORAL_TABLET | Freq: Two times a day (BID) | ORAL | 1 refills | Status: DC | PRN

## 2017-06-28 MED FILL — ALPRAZolam 0.5 MG TABS: 0.5 | 15 days supply | Qty: 30 | Fill #0

## 2017-06-28 NOTE — Telephone Encounter (Signed)
Printed/will fax to [employer].

## 2017-06-28 NOTE — Telephone Encounter (Signed)
OK 

## 2017-06-28 NOTE — Telephone Encounter (Signed)
Requesting:   alprazolam Contract    02/15/2017 UDS    None Last OV    06/26/2017 Last Refill    #30 with 1 refill on 04/20/2017  Please Advise

## 2017-07-03 ENCOUNTER — Encounter: Payer: Self-pay | Admitting: Family Medicine

## 2017-07-04 MED ORDER — TADALAFIL 20 MG PO TABS: 20.0000 mg | ORAL_TABLET | Freq: Every day | ORAL | 0 refills | Status: DC | PRN

## 2017-07-05 MED FILL — CIALIS 20 MG TABLET: 20 | 31 days supply | Qty: 3 | Fill #0

## 2017-07-14 ENCOUNTER — Ambulatory Visit (AMBULATORY_SURGERY_CENTER): Payer: Self-pay | Admitting: *Deleted

## 2017-07-14 VITALS — Ht 72.0 in | Wt 285.4 lb

## 2017-07-14 DIAGNOSIS — R1084 Generalized abdominal pain: Secondary | ICD-10-CM

## 2017-07-14 MED ORDER — NA SULFATE-K SULFATE-MG SULF 17.5-3.13-1.6 GM/177ML PO SOLN: 1.0000 | Freq: Once | ORAL | 0 refills | Status: AC

## 2017-07-14 MED FILL — SUPREP BOWEL PREP KIT: 17.5-3.13-1 | 1 days supply | Qty: 354 | Fill #0

## 2017-07-14 NOTE — Progress Notes (Signed)
Denies allergies to eggs or soy products. Denies complications with sedation or anesthesia. Denies O2 use. Denies use of diet or weight loss medications.  Emmi instructions given for colonoscopy.  

## 2017-07-18 ENCOUNTER — Ambulatory Visit (INDEPENDENT_AMBULATORY_CARE_PROVIDER_SITE_OTHER): Payer: 59 | Admitting: Psychology

## 2017-07-18 DIAGNOSIS — F4310 Post-traumatic stress disorder, unspecified: Secondary | ICD-10-CM

## 2017-07-18 MED FILL — ALPRAZolam 0.5 MG TABS: 0.5 | 15 days supply | Qty: 30 | Fill #1

## 2017-07-23 ENCOUNTER — Encounter: Payer: Self-pay | Admitting: Family Medicine

## 2017-07-24 ENCOUNTER — Other Ambulatory Visit: Payer: Self-pay | Admitting: Family Medicine

## 2017-07-24 MED ORDER — ZOLPIDEM TARTRATE 10 MG PO TABS: 10.0000 mg | ORAL_TABLET | Freq: Every evening | ORAL | 0 refills | Status: DC | PRN
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
## 2017-07-28 ENCOUNTER — Encounter: Payer: Self-pay | Admitting: Gastroenterology

## 2017-08-01 MED FILL — ATORVASTATIN 80 MG TABLET: 80 | 30 days supply | Qty: 30 | Fill #1

## 2017-08-01 MED FILL — PANTOPRAZOLE SOD DR 40 MG T: 40 | 30 days supply | Qty: 30 | Fill #1

## 2017-08-01 MED FILL — METOPROLOL SUCC ER 50 MG TA: 50 | 30 days supply | Qty: 30 | Fill #1

## 2017-08-01 MED FILL — AMLODIPINE BESYLATE 5 MG TA: 5 | 30 days supply | Qty: 30 | Fill #1

## 2017-08-01 MED FILL — LOSARTAN-HCTZ 100-25 MG TAB: 100-25 | 30 days supply | Qty: 30 | Fill #1

## 2017-08-01 MED FILL — TESTOSTERONE CYP 200 MG/ML: 200 | 28 days supply | Qty: 2 | Fill #1

## 2017-08-02 ENCOUNTER — Telehealth: Payer: Self-pay | Admitting: Family Medicine

## 2017-08-02 ENCOUNTER — Ambulatory Visit: Payer: Self-pay | Admitting: Family Medicine

## 2017-08-02 DIAGNOSIS — F341 Dysthymic disorder: Secondary | ICD-10-CM

## 2017-08-02 NOTE — Telephone Encounter (Signed)
OK to refill. Let's stop the Ambien since we have Xanax. Please reinforce that I do not want him drinking alcohol while on this medicine. TY.

## 2017-08-02 NOTE — Telephone Encounter (Signed)
Requesting:   alprazolam Contract    02/15/2017 UDS     None Last OV     06/26/2017 Last Refill    #30 with 1 refill on 06/28/2017  Please Advise

## 2017-08-03 ENCOUNTER — Telehealth: Payer: Self-pay | Admitting: Family Medicine

## 2017-08-03 ENCOUNTER — Encounter: Payer: Self-pay | Admitting: Gastroenterology

## 2017-08-03 MED ORDER — ALPRAZOLAM 0.5 MG PO TABS: 0.5000 mg | ORAL_TABLET | Freq: Two times a day (BID) | ORAL | 1 refills | Status: DC | PRN

## 2017-08-03 MED FILL — ALPRAZolam 0.5 MG TABS: 0.5 | 15 days supply | Qty: 30 | Fill #0

## 2017-08-03 NOTE — Telephone Encounter (Signed)
Pt returned call. Would like to be called back.  CB [phone]

## 2017-08-03 NOTE — Telephone Encounter (Signed)
Pt returned call. Would like to be called back. ° °CB [phone] °

## 2017-08-03 NOTE — Telephone Encounter (Signed)
Called informed the patient word for word PCP instructions. He did agree to stop [place] and no alcohol while taking xanax. The patient verbalized understanding/agreement to PCP instructions. Fax[facility] hardcopy [facility]

## 2017-08-03 NOTE — Telephone Encounter (Signed)
Called the patient and informed the dad to have patient call us when able to.

## 2017-08-03 NOTE — Telephone Encounter (Signed)
complete

## 2017-08-03 NOTE — Addendum Note (Signed)
Addended by: Scharlene GlossEWING, Saphire Barnhart B on: 08/03/2017 08:00 AM   Modules accepted: Orders

## 2017-08-03 NOTE — Telephone Encounter (Signed)
Printed hardcopy/PCP signed.

## 2017-08-04 ENCOUNTER — Ambulatory Visit: Payer: 59 | Admitting: Psychology

## 2017-08-04 DIAGNOSIS — F4310 Post-traumatic stress disorder, unspecified: Secondary | ICD-10-CM | POA: Diagnosis not present

## 2017-08-07 ENCOUNTER — Ambulatory Visit: Payer: Self-pay | Admitting: Family Medicine

## 2017-08-08 ENCOUNTER — Ambulatory Visit: Payer: Self-pay | Admitting: Psychology

## 2017-08-16 ENCOUNTER — Telehealth: Payer: Self-pay | Admitting: Gastroenterology

## 2017-08-17 ENCOUNTER — Encounter: Payer: Self-pay | Admitting: Gastroenterology

## 2017-08-17 NOTE — Telephone Encounter (Signed)
Aaron [name],    Please put a condolence card in my office so I may send it to this patient.

## 2017-08-17 NOTE — Telephone Encounter (Signed)
done

## 2017-08-30 ENCOUNTER — Ambulatory Visit: Payer: Self-pay | Admitting: Family Medicine

## 2017-09-04 MED FILL — PANTOPRAZOLE SOD DR 40 MG T: 40 | 30 days supply | Qty: 30 | Fill #2

## 2017-09-04 MED FILL — METOPROLOL SUCC ER 50 MG TA: 50 | 30 days supply | Qty: 30 | Fill #2

## 2017-09-04 MED FILL — FLUoxetine HCL 10 MG CAPS: 10 | 30 days supply | Qty: 30 | Fill #1

## 2017-09-04 MED FILL — CIALIS 20 MG TABLET: 20 | 31 days supply | Qty: 3 | Fill #1

## 2017-09-04 MED FILL — ALPRAZolam 0.5 MG TABS: 0.5 | 15 days supply | Qty: 30 | Fill #1

## 2017-09-04 MED FILL — TESTOSTERONE CYPIONATE 200: 200 | 28 days supply | Qty: 2 | Fill #2

## 2017-09-04 MED FILL — FLUoxetine HCL 20 MG CAPS: 20 | 30 days supply | Qty: 30 | Fill #1

## 2017-09-04 MED FILL — AMLODIPINE BESYLATE 5 MG TA: 5 | 30 days supply | Qty: 30 | Fill #2

## 2017-09-04 MED FILL — LOSARTAN-HCTZ 100-25 MG TAB: 100-25 | 30 days supply | Qty: 30 | Fill #2

## 2017-09-04 MED FILL — ATORVASTATIN 80 MG TABLET: 80 | 30 days supply | Qty: 30 | Fill #2

## 2017-09-06 ENCOUNTER — Encounter: Payer: Self-pay | Admitting: Family Medicine

## 2017-09-06 ENCOUNTER — Ambulatory Visit: Payer: 59 | Admitting: Family Medicine

## 2017-09-06 VITALS — BP 132/86 | HR 93 | Temp 97.8°F | Ht 72.0 in | Wt 270.0 lb

## 2017-09-06 DIAGNOSIS — Z634 Disappearance and death of family member: Secondary | ICD-10-CM

## 2017-09-06 DIAGNOSIS — R42 Dizziness and giddiness: Secondary | ICD-10-CM | POA: Diagnosis not present

## 2017-09-06 MED ORDER — ARIPIPRAZOLE 5 MG PO TABS: 5.0000 mg | ORAL_TABLET | Freq: Every day | ORAL | 2 refills | Status: DC

## 2017-09-06 MED ORDER — MECLIZINE HCL 25 MG PO TABS: 25.0000 mg | ORAL_TABLET | Freq: Three times a day (TID) | ORAL | 1 refills | Status: DC | PRN

## 2017-09-06 MED ORDER — "NEEDLE (DISP) 22G X 1"" MISC": 0 refills | Status: DC

## 2017-09-06 MED FILL — MECLIZINE 25 MG TABLET: 25 | 10 days supply | Qty: 30 | Fill #0

## 2017-09-06 MED FILL — ARIPiprazole 5 MG TABS: 5 | 30 days supply | Qty: 30 | Fill #0

## 2017-09-06 NOTE — Progress Notes (Signed)
Pre visit review using our clinic review tool, if applicable. No additional management support is needed unless otherwise documented below in the visit note. 

## 2017-09-06 NOTE — Progress Notes (Signed)
Chief Complaint  Patient presents with  . Follow-up   Aaron AddisonJohnny [name] is a 46 year old white male here for follow-up after the death of his wife.  I did have a discussion with him over the telephone detailing the event.  He is getting somewhat better, however still struggling throughout the day.  He does not go outside of the house.  He is drinking 6-12 beers per day to help cope.  He is prescribed as needed Xanax and has not been using that frequently.  He is tearful much of the day.  He finds that he does not want to do much.  He does have an adolescent son.  His sister and mother are staying with him to help.  He is not currently working.  He continues to have issues with vertigo.  BP 132/86 (BP Location: Left Arm, Patient Position: Sitting, Cuff Size: Large)   Pulse 93   Temp 97.8 F (36.6 C) (Oral)   Ht 6' (1.829 m)   Wt 270 lb (122.5 kg)   SpO2 97%   BMI 36.62 kg/m  Const- awake, alert Psych-age-appropriate judgment and insight, normal affect, dysthymic mood, he did become tearful during the exam  Bereavement due to life event - Plan: ARIPiprazole (ABILIFY) 5 MG tablet  Vertigo - Plan: meclizine (ANTIVERT) 25 MG tablet, Ambulatory referral to Physical Therapy  Add Abilify.  Continue seeing counseling.  Consider psychiatry. I would like him to cut down on his alcohol use.  He was encouraged to force himself to go out and do something to take his mind off of his wife's death. Follow-up in 1 month. The patient voiced understanding and agreement to the plan.  Greater than 25 minutes were spent face to face with the patient with greater than 50% of this time spent counseling on alcohol use, bereavement, treatment and follow up.  Jilda Rocheicholas Paul Say[place] ManorWendling, DO 09/06/17 8:34 AM

## 2017-09-06 NOTE — Patient Instructions (Addendum)
How to Perform the Epley Maneuver The Epley maneuver is an exercise that relieves symptoms of vertigo. Vertigo is the feeling that you or your surroundings are moving when they are not. When you feel vertigo, you may feel like the room is spinning and have trouble walking. Dizziness is a little different than vertigo. When you are dizzy, you may feel unsteady or light-headed. You can do this maneuver at home whenever you have symptoms of vertigo. You can do it up to 3 times a day until your symptoms go away. Even though the Epley maneuver may relieve your vertigo for a few weeks, it is possible that your symptoms will return. This maneuver relieves vertigo, but it does not relieve dizziness. What are the risks? If it is done correctly, the Epley maneuver is considered safe. Sometimes it can lead to dizziness or nausea that goes away after a short time. If you develop other symptoms, such as changes in vision, weakness, or numbness, stop doing the maneuver and call your health care provider. How to perform the Epley maneuver 1. Sit on the edge of a bed or table with your back straight and your legs extended or hanging over the edge of the bed or table. 2. Turn your head halfway toward the affected ear or side. 3. Lie backward quickly with your head turned until you are lying flat on your back. You may want to position a pillow under your shoulders. 4. Hold this position for 30 seconds. You may experience an attack of vertigo. This is normal. 5. Turn your head to the opposite direction until your unaffected ear is facing the floor. 6. Hold this position for 30 seconds. You may experience an attack of vertigo. This is normal. Hold this position until the vertigo stops. 7. Turn your whole body to the same side as your head. Hold for another 30 seconds. 8. Sit back up. You can repeat this exercise up to 3 times a day. Follow these instructions at home:  After doing the Epley maneuver, you can return to  your normal activities.  Ask your health care provider if there is anything you should do at home to prevent vertigo. He or she may recommend that you: ? Keep your head raised (elevated) with two or more pillows while you sleep. ? Do not sleep on the side of your affected ear. ? Get up slowly from bed. ? Avoid sudden movements during the day. ? Avoid extreme head movement, like looking up or bending over. Contact a health care provider if:  Your vertigo gets worse.  You have other symptoms, including: ? Nausea. ? Vomiting. ? Headache. Get help right away if:  You have vision changes.  You have a severe or worsening headache or neck pain.  You cannot stop vomiting.  You have new numbness or weakness in any part of your body. Summary  Vertigo is the feeling that you or your surroundings are moving when they are not.  The Epley maneuver is an exercise that relieves symptoms of vertigo.  If the Epley maneuver is done correctly, it is considered safe. You can do it up to 3 times a day. This information is not intended to replace advice given to you by your health care provider. Make sure you discuss any questions you have with your health care provider. Document Released: 09/10/2013 Document Revised: 07/26/2016 Document Reviewed: 07/26/2016 Elsevier Interactive Patient Education  2017 [employer]  Bring in paperwork for me to look at.  Cut  down on alcohol.  Get out and try to be active.

## 2017-09-13 ENCOUNTER — Telehealth: Payer: Self-pay | Admitting: Family Medicine

## 2017-09-13 NOTE — Telephone Encounter (Signed)
Copied from CRM [phone]. Topic: Quick Communication - See Telephone Encounter >> Sep 13, 2017 12:19 PM Valentina LucksMatos, Jackelin wrote: CRM for notification. See Telephone encounter for:  09/13/17.   Document dropped off for provider to fill out for pt Aaron [name]([employer]- in a large white envelope). Pt would like to be called at [phone] when document ready. Document put at front office tray.

## 2017-09-18 NOTE — Telephone Encounter (Signed)
Received Medical Report for Disability with Benefit paperwork attached; completed as much as possible and forwarded to provider/SLS 12/31

## 2017-09-19 ENCOUNTER — Emergency Department (HOSPITAL_BASED_OUTPATIENT_CLINIC_OR_DEPARTMENT_OTHER)
Admission: EM | Admit: 2017-09-19 | Discharge: 2017-09-19 | Disposition: A | Discharge status: Home / Self Care | Payer: 59 | Attending: Emergency Medicine | Admitting: Emergency Medicine

## 2017-09-19 ENCOUNTER — Encounter (HOSPITAL_BASED_OUTPATIENT_CLINIC_OR_DEPARTMENT_OTHER): Payer: Self-pay | Admitting: Emergency Medicine

## 2017-09-19 ENCOUNTER — Other Ambulatory Visit: Payer: Self-pay

## 2017-09-19 DIAGNOSIS — I10 Essential (primary) hypertension: Secondary | ICD-10-CM | POA: Insufficient documentation

## 2017-09-19 DIAGNOSIS — Z87891 Personal history of nicotine dependence: Secondary | ICD-10-CM | POA: Insufficient documentation

## 2017-09-19 DIAGNOSIS — K2970 Gastritis, unspecified, without bleeding: Secondary | ICD-10-CM | POA: Diagnosis not present

## 2017-09-19 DIAGNOSIS — Z79899 Other long term (current) drug therapy: Secondary | ICD-10-CM | POA: Insufficient documentation

## 2017-09-19 DIAGNOSIS — R079 Chest pain, unspecified: Secondary | ICD-10-CM | POA: Diagnosis present

## 2017-09-19 DIAGNOSIS — R0789 Other chest pain: Secondary | ICD-10-CM

## 2017-09-19 DIAGNOSIS — F419 Anxiety disorder, unspecified: Secondary | ICD-10-CM | POA: Diagnosis not present

## 2017-09-19 HISTORY — DX: Post-traumatic stress disorder, unspecified: F43.10

## 2017-09-19 LAB — HEPATIC FUNCTION PANEL
ALBUMIN: 4.4 g/dL (ref 3.5–5.0)
ALK PHOS: 55 U/L (ref 38–126)
ALT: 40 U/L (ref 17–63)
AST: 28 U/L (ref 15–41)
Bilirubin, Direct: <0.1 mg/dL — ABNORMAL LOW (ref 0.1–0.5)
TOTAL PROTEIN: 8.2 g/dL — AB (ref 6.5–8.1)
Total Bilirubin: 0.5 mg/dL (ref 0.3–1.2)

## 2017-09-19 LAB — BASIC METABOLIC PANEL
Anion gap: 11 (ref 5–15)
BUN: 12 mg/dL (ref 6–20)
CALCIUM: 9 mg/dL (ref 8.9–10.3)
CO2: 23 mmol/L (ref 22–32)
Chloride: 102 mmol/L (ref 101–111)
Creatinine, Ser: 1.24 mg/dL (ref 0.61–1.24)
GFR calc Af Amer: >60 mL/min (ref >60)
GLUCOSE: 99 mg/dL (ref 65–99)
Potassium: 3.3 mmol/L — ABNORMAL LOW (ref 3.5–5.1)
Sodium: 136 mmol/L (ref 135–145)

## 2017-09-19 LAB — TROPONIN I

## 2017-09-19 LAB — LIPASE, BLOOD: LIPASE: 27 U/L (ref 11–51)

## 2017-09-19 LAB — CBC
HCT: 41.8 % (ref 39.0–52.0)
Hemoglobin: 14.7 g/dL (ref 13.0–17.0)
MCH: 31.1 pg (ref 26.0–34.0)
MCHC: 35.2 g/dL (ref 30.0–36.0)
MCV: 88.6 fL (ref 78.0–100.0)
Platelets: 403 10*3/uL — ABNORMAL HIGH (ref 150–400)
RBC: 4.72 MIL/uL (ref 4.22–5.81)
RDW: 12.2 % (ref 11.5–15.5)
WBC: 7.6 10*3/uL (ref 4.0–10.5)

## 2017-09-19 MED ORDER — KETOROLAC TROMETHAMINE 30 MG/ML IJ SOLN
30.0000 mg | Freq: Once | INTRAMUSCULAR | Status: AC
  Administered 2017-09-19: 30 mg via INTRAVENOUS
  Filled 2017-09-19: qty 1

## 2017-09-19 MED ORDER — OMEPRAZOLE 20 MG PO CPDR: 20.0000 mg | DELAYED_RELEASE_CAPSULE | Freq: Every day | ORAL | 0 refills | Status: DC

## 2017-09-19 MED ORDER — ONDANSETRON HCL 4 MG/2ML IJ SOLN
4.0000 mg | Freq: Once | INTRAMUSCULAR | Status: AC
  Administered 2017-09-19: 4 mg via INTRAVENOUS
  Filled 2017-09-19: qty 2

## 2017-09-19 MED ORDER — LORAZEPAM 1 MG PO TABS: 1.0000 mg | ORAL_TABLET | Freq: Three times a day (TID) | ORAL | 0 refills | Status: DC | PRN

## 2017-09-19 MED ORDER — LORAZEPAM 2 MG/ML IJ SOLN
1.0000 mg | Freq: Once | INTRAMUSCULAR | Status: AC
  Administered 2017-09-19: 1 mg via INTRAVENOUS
  Filled 2017-09-19: qty 1

## 2017-09-19 NOTE — ED Notes (Signed)
Pt has his friend pressing on his abdomen, states feels better with pressure

## 2017-09-19 NOTE — ED Provider Notes (Signed)
[facility] EMERGENCY DEPARTMENT Provider Note   CSN: [id number] Arrival date & time: 09/19/17  1842     History   Chief Complaint Chief Complaint  Patient presents with  . Chest Pain    HPI Aaron [name] is a 47 y.o. male with a past medical history of PTSD, GERD, anxiety, depression, hypertension, hyperlipidemia, who presents to ED for evaluation of 1 month history of chest pain that has gotten gradually worse in the past 2 days.  He has been seen and evaluated 2 times in the past 2 days since symptoms began.  He was evaluated in the ED and  earlier today and was discharged home with Vistaril and given a GI cocktail.  He states that he returns today because "this pain is different."  States that the pain begins in the epigastric area and radiates to the left side of his chest.  Also reports some associated shortness of breath and feelings of palpitations.  He did undergo a cardiac cath several years ago which was negative.  His compliance with his home blood pressure medications.  States that he is under a lot of stress due to work and the recent passing of his wife by suicide 1 month ago. Also reports diarrhea and nausea. He denies any hemoptysis, fever, URI symptoms, prior DVT or PE, prior MI, leg swelling, recent prolonged travel, history of cancer, recent surgery.  Prior abdominal surgery includes hernia surgery several years ago.  HPI  Past Medical History:  Diagnosis Date  . Anxiety   . Depression   . GERD (gastroesophageal reflux disease)   . Heart murmur   . History of chicken pox   . Hyperlipidemia   . Hypertension   . PTSD (post-traumatic stress disorder)   . Sleep apnea   . Tinnitus of both ears     Patient Active Problem List   Diagnosis Date Noted  . Sleep apnea 01/09/2012  . Hypogonadism male 01/02/2012  . History of blurred vision 12/30/2011  . Dizziness 12/21/2011  . Obesity (BMI 30-39.9) 11/23/2011  . Somnolence 11/23/2011  .  Nonspecific abnormal electrocardiogram (ECG) (EKG) 11/23/2011  . Umbilical hernia 11/05/2010  . ANXIETY DEPRESSION 03/10/2010  . TMJ PAIN 01/12/2009  . PALPITATIONS 12/01/2008  . TINNITUS NOS 06/13/2007  . HYPERTENSION, BENIGN ESSENTIAL 06/13/2007    Past Surgical History:  Procedure Laterality Date  . broken fingers    . HERNIA REPAIR         Home Medications    Prior to Admission medications   Medication Sig Start Date End Date Taking? Authorizing Provider  ALPRAZolam Prudy Feeler) 0.5 MG tablet Take 1 tablet (0.5 mg total) 2 (two) times daily as needed by mouth for anxiety. 08/03/17   Sharlene Dory, DO  amLODipine (NORVASC) 5 MG tablet Take 5 mg by mouth daily. 06/21/17   [provider]  ARIPiprazole (ABILIFY) 5 MG tablet Take 1 tablet (5 mg total) by mouth daily. 09/06/17   Sharlene Dory, DO  atorvastatin (LIPITOR) 80 MG tablet Take 1 tablet (80 mg total) by mouth daily. 06/26/17   Sharlene Dory, DO  FLUoxetine (PROZAC) 10 MG tablet Take 1 tablet (10 mg total) by mouth daily. 06/26/17   Sharlene Dory, DO  FLUoxetine (PROZAC) 20 MG tablet Take 1 tablet (20 mg total) by mouth daily. 06/26/17   Sharlene Dory, DO  Ginkgo Biloba Complex 440 MG CAPS Take 1 capsule by mouth daily.    [provider]  LORazepam (  ATIVAN) 1 MG tablet Take 1 tablet (1 mg total) by mouth every 8 (eight) hours as needed for anxiety. 09/19/17   Jamayah Myszka, PA-C  losartan-hydrochlorothiazide (HYZAAR) 100-25 MG tablet Take 1 tablet by mouth daily. 06/26/17   Sharlene Dory, DO  meclizine (ANTIVERT) 25 MG tablet Take 1 tablet (25 mg total) by mouth 3 (three) times daily as needed for dizziness. 09/06/17   Sharlene Dory, DO  metoprolol succinate (TOPROL-XL) 50 MG 24 hr tablet Take 1 tablet (50 mg total) by mouth daily. 06/26/17   Sharlene Dory, DO  Multiple Vitamin (MULTIVITAMIN) tablet Take 1 tablet by mouth daily.    [provider]  NEEDLE, DISP, 22 G 22G X 1" MISC Use to inject testosterone intramuscularly every 2 weeks. 09/06/17   Sharlene Dory, DO  Omega-3 1000 MG CAPS Take by mouth. Take 4 g by mouth daily.    [provider]  omeprazole (PRILOSEC) 20 MG capsule Take 1 capsule (20 mg total) by mouth daily. 09/19/17   Tazaria Dlugosz, PA-C  pantoprazole (PROTONIX) 40 MG tablet Take 1 tablet (40 mg total) by mouth daily. 06/26/17   Sharlene Dory, DO  tadalafil (CIALIS) 20 MG tablet Take 1 tablet (20 mg total) by mouth daily as needed for erectile dysfunction. 07/04/17   Sharlene Dory, DO  testosterone cypionate (DEPOTESTOSTERONE CYPIONATE) 200 MG/ML injection Inject 1 mL (200 mg total) into the muscle every 14 (fourteen) days. 06/26/17   Sharlene Dory, DO    Family History Family History  Problem Relation Age of Onset  . Diabetes Mother   . Hyperlipidemia Mother   . Hypertension Mother   . Heart disease Mother   . Cancer Father   . Hyperlipidemia Father   . Hypertension Father   . Other Father        kidney removal  . Heart disease Father   . Kidney disease Father   . Cancer Sister   . Hypertension Sister   . Colon cancer Neg Hx   . Esophageal cancer Neg Hx   . Rectal cancer Neg Hx   . Stomach cancer Neg Hx     Social History Social History   Tobacco Use  . Smoking status: Former Smoker    Last attempt to quit: 09/19/1996    Years since quitting: 21.0  . Smokeless tobacco: Never Used  Substance Use Topics  . Alcohol use: Yes    Comment: rarely  . Drug use: No     Allergies   Patient has no known allergies.   Review of Systems Review of Systems  Constitutional: Negative for appetite change, chills and fever.  HENT: Negative for ear pain, rhinorrhea, sneezing and sore throat.   Eyes: Negative for photophobia and visual disturbance.  Respiratory: Positive for shortness of breath. Negative for cough, chest tightness and wheezing.     Cardiovascular: Positive for chest pain and palpitations.  Gastrointestinal: Positive for abdominal pain, diarrhea and nausea. Negative for blood in stool, constipation and vomiting.  Genitourinary: Negative for dysuria, hematuria and urgency.  Musculoskeletal: Negative for myalgias.  Skin: Negative for rash.  Neurological: Negative for dizziness, weakness and light-headedness.  Psychiatric/Behavioral: The patient is nervous/anxious.      Physical Exam Updated Vital Signs BP 140/88   Pulse 82   Temp 98.3 F (36.8 C) (Oral)   Resp 16   SpO2 93%   Physical Exam  Constitutional: He appears well-developed and well-nourished. No distress.  Appears very anxious.  Appears uncomfortable.  HENT:  Head: Normocephalic and atraumatic.  Nose: Nose normal.  Eyes: Conjunctivae and EOM are normal. Right eye exhibits no discharge. Left eye exhibits no discharge. No scleral icterus.  Neck: Normal range of motion. Neck supple.  Cardiovascular: Regular rhythm, normal heart sounds and intact distal pulses. Tachycardia present. Exam reveals no gallop and no friction rub.  No murmur heard. Pulmonary/Chest: Effort normal and breath sounds normal. No respiratory distress.  Abdominal: Soft. Bowel sounds are normal. He exhibits no distension. There is no tenderness. There is no guarding.    Musculoskeletal: Normal range of motion. He exhibits no edema.  Neurological: He is alert. He exhibits normal muscle tone. Coordination normal.  Skin: Skin is warm and dry. No rash noted.  Psychiatric: He has a normal mood and affect.  Nursing note and vitals reviewed.    ED Treatments / Results  Labs (all labs ordered are listed, but only abnormal results are displayed) Labs Reviewed  BASIC METABOLIC PANEL - Abnormal; Notable for the following components:      Result Value   Potassium 3.3 (*)    All other components within normal limits  CBC - Abnormal; Notable for the following components:   Platelets  403 (*)    All other components within normal limits  HEPATIC FUNCTION PANEL - Abnormal; Notable for the following components:   Total Protein 8.2 (*)    Bilirubin, Direct <0.1 (*)    All other components within normal limits  TROPONIN I  LIPASE, BLOOD  URINALYSIS, ROUTINE W REFLEX MICROSCOPIC    EKG  EKG Interpretation  Date/Time:  Tuesday September 19 2017 18:49:12 EST Ventricular Rate:  113 PR Interval:  138 QRS Duration: 84 QT Interval:  320 QTC Calculation: 438 R Axis:   48 Text Interpretation:  Sinus tachycardia Nonspecific T wave abnormality Abnormal ECG no significant change since Sept 2018 Confirmed by Pricilla LovelessGoldston, Scott [phone]([id number]) on 09/19/2017 7:44:10 PM       Radiology No results found.  Procedures Procedures (including critical care time)  Medications Ordered in ED Medications  ondansetron (ZOFRAN) injection 4 mg (4 mg Intravenous Given 09/19/17 1955)  LORazepam (ATIVAN) injection 1 mg (1 mg Intravenous Given 09/19/17 2030)  ketorolac (TORADOL) 30 MG/ML injection 30 mg (30 mg Intravenous Given 09/19/17 2030)     Initial Impression / Assessment and Plan / ED Course  I have reviewed the triage vital signs and the nursing notes.  Pertinent labs & imaging results that were available during my care of the patient were reviewed by me and considered in my medical decision making (see chart for details).     Patient, with past medical history of PTSD, GERD, anxiety, depression, hypertension, hyperlipidemia who presents to ED for evaluation of 1 month history of chest pain that has gotten progressively worse for the past 2 days.  He has been seen and evaluated 2 times in the past 2 days prior to his arrival here since his symptoms began.  He has had extensive workup including EKG, troponins, d-dimer, CT of the abdomen and pelvis, chest x-rays with no significant life-threatening process found.  Providers have found that this could be due to his anxiety or possibly his reflux.   Patient does appear extremely anxious during my evaluation.  States that he is under a lot of stress and anxiety due to the recent passing of his wife by suicide 1 month ago.  States that he ran out of his Xanax which usually helps him with his  anxiety.  His PCP is referring him to a psychiatrist or psychologist to help with his symptoms and further medication management.  States that in the past his reflux medications have helped with similar symptoms.  Patient given Toradol and Ativan here with complete resolution of his symptoms.  He reports improvement in his nausea with Zofran.  I did chart review and it appears that patient has had an extensive workup in the last 48 hours for similar symptoms.  There are no signs of gallbladder pathology, cardiac or pulmonary pathology that would explain his symptoms.  I think there is a component of anxiety and stress that is causing this.  Here his CBC, BMP, troponin, lipase and LFTs are unremarkable.  EKG with tracing similar to previous.  I checked the nursing narcotic database and he has not had his Xanax filled for the past 3 weeks and he usually gets a 2-week prescription at a time.  I think that it is safe to give him a short course of Ativan to be taken as needed as well as PPI to help with his reflux.  At this time I have low suspicion for life-threatening cause of his symptoms.  He denies any SI, HI or hallucinations or any other psychiatric symptoms at this time.  Patient appears stable for discharge at this time.  Strict return precautions given.  Final Clinical Impressions(s) / ED Diagnoses   Final diagnoses:  Anxiety  Chest wall pain  Gastritis without bleeding, unspecified chronicity, unspecified gastritis type    ED Discharge Orders        Ordered    omeprazole (PRILOSEC) 20 MG capsule  Daily     09/19/17 2211    LORazepam (ATIVAN) 1 MG tablet  Every 8 hours PRN     09/19/17 2211     Portions of this note were generated with Dragon dictation  software. Dictation errors may occur despite best attempts at proofreading.    Dietrich Pates, PA-C 09/20/17 6962    Pricilla Loveless, MD 09/22/17 1034

## 2017-09-19 NOTE — ED Triage Notes (Addendum)
Pt c/o chest pain since yesterday. Pt seen [facility] yesterday and this morning, dx with anxiety and GERD. Pt states he has been taking rx that he was given without relief. Pt is hyperventilating and appears very anxious

## 2017-09-19 NOTE — ED Notes (Signed)
Patient attempting to obtain urine sample at this time.  

## 2017-09-19 NOTE — Discharge Instructions (Signed)
Take Ativan as needed for anxiety. Take Prilosec 1 hour before breakfast for the next 2 weeks to help with your reflux symptoms. Follow-up with your specialist and primary care provider for further evaluation. Return to ED for worsening chest pain, trouble breathing, wheezing, coughing up blood, leg swelling, lightheadedness or loss of consciousness.

## 2017-09-20 ENCOUNTER — Ambulatory Visit: Payer: 59 | Attending: Family Medicine | Admitting: Physical Therapy

## 2017-09-22 NOTE — Telephone Encounter (Signed)
LMOM with contact name and number [for return call, if needed] RE: paperwork completed and can be p/u at front desk during regular business hours; also that we have not faxed any of the papers, as we do not know how the benefit papers that were included were to be used per provider instructions/SLS 01/04

## 2017-09-23 ENCOUNTER — Emergency Department (HOSPITAL_BASED_OUTPATIENT_CLINIC_OR_DEPARTMENT_OTHER)
Admission: EM | Admit: 2017-09-23 | Discharge: 2017-09-23 | Disposition: A | Discharge status: Home / Self Care | Payer: 59 | Attending: Emergency Medicine | Admitting: Emergency Medicine

## 2017-09-23 ENCOUNTER — Other Ambulatory Visit: Payer: Self-pay

## 2017-09-23 ENCOUNTER — Encounter (HOSPITAL_BASED_OUTPATIENT_CLINIC_OR_DEPARTMENT_OTHER): Payer: Self-pay | Admitting: Emergency Medicine

## 2017-09-23 DIAGNOSIS — Z5321 Procedure and treatment not carried out due to patient leaving prior to being seen by health care provider: Secondary | ICD-10-CM | POA: Diagnosis not present

## 2017-09-23 DIAGNOSIS — R1013 Epigastric pain: Secondary | ICD-10-CM | POA: Diagnosis not present

## 2017-09-23 NOTE — ED Triage Notes (Signed)
Patient states that he is having pain to his mid epigatric region that radiates through to his mid back. He has been waiting in the waiting room because he was feeling anxious and  "it made me feel better"  Prior to checking in. The patient states that he went to the pharmacy and got some malox and it has not helped. He continues to push on his mid epigastric region to help his pain

## 2017-09-23 NOTE — ED Notes (Signed)
Pt did not speak to RN before leaving

## 2017-09-25 ENCOUNTER — Encounter (HOSPITAL_BASED_OUTPATIENT_CLINIC_OR_DEPARTMENT_OTHER): Payer: Self-pay | Admitting: Emergency Medicine

## 2017-09-25 ENCOUNTER — Ambulatory Visit: Payer: 59 | Admitting: Psychology

## 2017-09-25 ENCOUNTER — Emergency Department (HOSPITAL_BASED_OUTPATIENT_CLINIC_OR_DEPARTMENT_OTHER)
Admission: EM | Admit: 2017-09-25 | Discharge: 2017-09-25 | Disposition: A | Discharge status: Home / Self Care | Payer: 59 | Attending: Emergency Medicine | Admitting: Emergency Medicine

## 2017-09-25 ENCOUNTER — Emergency Department (HOSPITAL_BASED_OUTPATIENT_CLINIC_OR_DEPARTMENT_OTHER): Payer: 59

## 2017-09-25 ENCOUNTER — Other Ambulatory Visit: Payer: Self-pay

## 2017-09-25 ENCOUNTER — Telehealth: Payer: Self-pay | Admitting: Gastroenterology

## 2017-09-25 DIAGNOSIS — I10 Essential (primary) hypertension: Secondary | ICD-10-CM | POA: Insufficient documentation

## 2017-09-25 DIAGNOSIS — Z87891 Personal history of nicotine dependence: Secondary | ICD-10-CM | POA: Insufficient documentation

## 2017-09-25 DIAGNOSIS — R110 Nausea: Secondary | ICD-10-CM

## 2017-09-25 DIAGNOSIS — R1013 Epigastric pain: Secondary | ICD-10-CM | POA: Insufficient documentation

## 2017-09-25 DIAGNOSIS — Z79899 Other long term (current) drug therapy: Secondary | ICD-10-CM | POA: Insufficient documentation

## 2017-09-25 LAB — TROPONIN I: Troponin I: <0.03 ng/mL (ref <0.03)

## 2017-09-25 LAB — CBC WITH DIFFERENTIAL/PLATELET
BASOS ABS: 0.1 10*3/uL (ref 0.0–0.1)
BASOS PCT: 1 %
Eosinophils Absolute: 0.2 10*3/uL (ref 0.0–0.7)
Eosinophils Relative: 2 %
HEMATOCRIT: 40.7 % (ref 39.0–52.0)
Hemoglobin: 14.5 g/dL (ref 13.0–17.0)
LYMPHS PCT: 25 %
Lymphs Abs: 2.1 10*3/uL (ref 0.7–4.0)
MCH: 31.3 pg (ref 26.0–34.0)
MCHC: 35.6 g/dL (ref 30.0–36.0)
MCV: 87.9 fL (ref 78.0–100.0)
MONO ABS: 0.7 10*3/uL (ref 0.1–1.0)
Monocytes Relative: 9 %
NEUTROS ABS: 5.4 10*3/uL (ref 1.7–7.7)
NEUTROS PCT: 63 %
Platelets: 390 10*3/uL (ref 150–400)
RBC: 4.63 MIL/uL (ref 4.22–5.81)
RDW: 12.1 % (ref 11.5–15.5)
WBC: 8.4 10*3/uL (ref 4.0–10.5)

## 2017-09-25 LAB — COMPREHENSIVE METABOLIC PANEL
ALBUMIN: 4.2 g/dL (ref 3.5–5.0)
ALT: 37 U/L (ref 17–63)
AST: 26 U/L (ref 15–41)
Alkaline Phosphatase: 52 U/L (ref 38–126)
Anion gap: 14 (ref 5–15)
BUN: 18 mg/dL (ref 6–20)
CO2: 23 mmol/L (ref 22–32)
CREATININE: 1.15 mg/dL (ref 0.61–1.24)
Calcium: 9.1 mg/dL (ref 8.9–10.3)
Chloride: 95 mmol/L — ABNORMAL LOW (ref 101–111)
GFR calc Af Amer: >60 mL/min (ref >60)
GLUCOSE: 119 mg/dL — AB (ref 65–99)
POTASSIUM: 3.2 mmol/L — AB (ref 3.5–5.1)
Sodium: 132 mmol/L — ABNORMAL LOW (ref 135–145)
TOTAL PROTEIN: 7.6 g/dL (ref 6.5–8.1)
Total Bilirubin: 0.5 mg/dL (ref 0.3–1.2)

## 2017-09-25 LAB — LIPASE, BLOOD: Lipase: 29 U/L (ref 11–51)

## 2017-09-25 MED ORDER — ONDANSETRON HCL 4 MG PO TABS: 4.0000 mg | ORAL_TABLET | Freq: Three times a day (TID) | ORAL | 0 refills | Status: DC | PRN

## 2017-09-25 MED ORDER — KETOROLAC TROMETHAMINE 30 MG/ML IJ SOLN
30.0000 mg | Freq: Once | INTRAMUSCULAR | Status: AC
Start: 2017-09-25 — End: 2017-09-25
  Administered 2017-09-25: 30 mg via INTRAVENOUS
  Filled 2017-09-25: qty 1

## 2017-09-25 MED ORDER — ONDANSETRON HCL 4 MG/2ML IJ SOLN
4.0000 mg | Freq: Once | INTRAMUSCULAR | Status: AC
  Administered 2017-09-25: 4 mg via INTRAVENOUS
  Filled 2017-09-25: qty 2

## 2017-09-25 MED ORDER — OMEPRAZOLE 40 MG PO CPDR: 40.0000 mg | DELAYED_RELEASE_CAPSULE | Freq: Every day | ORAL | 0 refills | Status: DC

## 2017-09-25 MED ORDER — GI COCKTAIL ~~LOC~~
30.0000 mL | Freq: Once | ORAL | Status: DC
  Filled 2017-09-25: qty 30

## 2017-09-25 MED ORDER — GI COCKTAIL ~~LOC~~
30.0000 mL | Freq: Once | ORAL | Status: AC
  Administered 2017-09-25: 30 mL via ORAL
  Filled 2017-09-25: qty 30

## 2017-09-25 MED ORDER — SODIUM CHLORIDE 0.9 % IV BOLUS (SEPSIS)
1000.0000 mL | Freq: Once | INTRAVENOUS | Status: AC
  Administered 2017-09-25: 1000 mL via INTRAVENOUS

## 2017-09-25 MED FILL — OMEPRAZOLE DR 40 MG CAPSULE: 40 | 21 days supply | Qty: 21 | Fill #0

## 2017-09-25 MED FILL — ONDANSETRON HCL 4 MG TABLET: 4 | 4 days supply | Qty: 12 | Fill #0

## 2017-09-25 NOTE — Telephone Encounter (Signed)
Routed to Dr. Danis. 

## 2017-09-25 NOTE — ED Notes (Signed)
Patient requesting another GI cocktail. MD made aware.

## 2017-09-25 NOTE — Discharge Instructions (Signed)
Your laboratory testing today was overall reassuring.  Please stay hydrated.  Please avoid the hot sauces and other foods that can irritate your reflux.  Please take your Prilosec which we have slightly increase the dose.  Please follow-up with your gastroenterologist for evaluation and management.  If any symptoms change or worsen, please return to the nearest emergency department.

## 2017-09-25 NOTE — ED Triage Notes (Signed)
Pt having midline abdominal pain radiating to epigastric area.  Occasionally shoots to chest.  Pain waxes and wanes.  Pt has been seen for same in last week.

## 2017-09-25 NOTE — ED Provider Notes (Signed)
[facility] EMERGENCY DEPARTMENT Provider Note   CSN: [id number] Arrival date & time: 09/25/17  0736     History   Chief Complaint Chief Complaint  Patient presents with  . Abdominal Pain    HPI Aaron [name] is a 47 y.o. male.  The history is provided by the patient and medical records. No language interpreter was used.  Abdominal Pain   This is a recurrent problem. The current episode started more than 1 week ago. The problem occurs constantly. The problem has not changed since onset.The pain is associated with eating. The pain is located in the epigastric region. The quality of the pain is aching and burning. The pain is severe. Associated symptoms include nausea. Pertinent negatives include fever, diarrhea, vomiting, constipation, dysuria, frequency and headaches. The symptoms are aggravated by palpation and eating. Nothing relieves the symptoms. Past workup includes CT scan. Past workup does not include surgery. His past medical history is significant for GERD.    Past Medical History:  Diagnosis Date  . Anxiety   . Depression   . GERD (gastroesophageal reflux disease)   . Heart murmur   . History of chicken pox   . Hyperlipidemia   . Hypertension   . PTSD (post-traumatic stress disorder)   . Sleep apnea   . Tinnitus of both ears     Patient Active Problem List   Diagnosis Date Noted  . Sleep apnea 01/09/2012  . Hypogonadism male 01/02/2012  . History of blurred vision 12/30/2011  . Dizziness 12/21/2011  . Obesity (BMI 30-39.9) 11/23/2011  . Somnolence 11/23/2011  . Nonspecific abnormal electrocardiogram (ECG) (EKG) 11/23/2011  . Umbilical hernia 11/05/2010  . ANXIETY DEPRESSION 03/10/2010  . TMJ PAIN 01/12/2009  . PALPITATIONS 12/01/2008  . TINNITUS NOS 06/13/2007  . HYPERTENSION, BENIGN ESSENTIAL 06/13/2007    Past Surgical History:  Procedure Laterality Date  . broken fingers    . HERNIA REPAIR         Home Medications    Prior to  Admission medications   Medication Sig Start Date End Date Taking? Authorizing Provider  ALPRAZolam Prudy Feeler(XANAX) 0.5 MG tablet Take 1 tablet (0.5 mg total) 2 (two) times daily as needed by mouth for anxiety. 08/03/17   Sharlene DoryWendling, Nicholas Paul, DO  amLODipine (NORVASC) 5 MG tablet Take 5 mg by mouth daily. 06/21/17   [provider]  ARIPiprazole (ABILIFY) 5 MG tablet Take 1 tablet (5 mg total) by mouth daily. 09/06/17   Sharlene DoryWendling, Nicholas Paul, DO  atorvastatin (LIPITOR) 80 MG tablet Take 1 tablet (80 mg total) by mouth daily. 06/26/17   Sharlene DoryWendling, Nicholas Paul, DO  FLUoxetine (PROZAC) 10 MG tablet Take 1 tablet (10 mg total) by mouth daily. 06/26/17   Sharlene DoryWendling, Nicholas Paul, DO  FLUoxetine (PROZAC) 20 MG tablet Take 1 tablet (20 mg total) by mouth daily. 06/26/17   Sharlene DoryWendling, Nicholas Paul, DO  Ginkgo Biloba Complex 440 MG CAPS Take 1 capsule by mouth daily.    [provider]  LORazepam (ATIVAN) 1 MG tablet Take 1 tablet (1 mg total) by mouth every 8 (eight) hours as needed for anxiety. 09/19/17   Khatri, Hina, PA-C  losartan-hydrochlorothiazide (HYZAAR) 100-25 MG tablet Take 1 tablet by mouth daily. 06/26/17   Sharlene DoryWendling, Nicholas Paul, DO  meclizine (ANTIVERT) 25 MG tablet Take 1 tablet (25 mg total) by mouth 3 (three) times daily as needed for dizziness. 09/06/17   Sharlene DoryWendling, Nicholas Paul, DO  metoprolol succinate (TOPROL-XL) 50 MG 24 hr tablet Take  1 tablet (50 mg total) by mouth daily. 06/26/17   Sharlene Dory, DO  Multiple Vitamin (MULTIVITAMIN) tablet Take 1 tablet by mouth daily.    [provider]  NEEDLE, DISP, 22 G 22G X 1" MISC Use to inject testosterone intramuscularly every 2 weeks. 09/06/17   Sharlene Dory, DO  Omega-3 1000 MG CAPS Take by mouth. Take 4 g by mouth daily.    [provider]  omeprazole (PRILOSEC) 20 MG capsule Take 1 capsule (20 mg total) by mouth daily. 09/19/17   Khatri, Hina, PA-C  pantoprazole (PROTONIX) 40 MG tablet Take 1  tablet (40 mg total) by mouth daily. 06/26/17   Sharlene Dory, DO  tadalafil (CIALIS) 20 MG tablet Take 1 tablet (20 mg total) by mouth daily as needed for erectile dysfunction. 07/04/17   Sharlene Dory, DO  testosterone cypionate (DEPOTESTOSTERONE CYPIONATE) 200 MG/ML injection Inject 1 mL (200 mg total) into the muscle every 14 (fourteen) days. 06/26/17   Sharlene Dory, DO    Family History Family History  Problem Relation Age of Onset  . Diabetes Mother   . Hyperlipidemia Mother   . Hypertension Mother   . Heart disease Mother   . Cancer Father   . Hyperlipidemia Father   . Hypertension Father   . Other Father        kidney removal  . Heart disease Father   . Kidney disease Father   . Cancer Sister   . Hypertension Sister   . Colon cancer Neg Hx   . Esophageal cancer Neg Hx   . Rectal cancer Neg Hx   . Stomach cancer Neg Hx     Social History Social History   Tobacco Use  . Smoking status: Former Smoker    Last attempt to quit: 09/19/1996    Years since quitting: 21.0  . Smokeless tobacco: Never Used  Substance Use Topics  . Alcohol use: Yes    Comment: rarely, last use yesterday  . Drug use: No     Allergies   Patient has no known allergies.   Review of Systems Review of Systems  Constitutional: Negative for chills, diaphoresis, fatigue and fever.  HENT: Negative for congestion.   Eyes: Negative for visual disturbance.  Respiratory: Negative for cough, chest tightness, shortness of breath, wheezing and stridor.   Cardiovascular: Negative for chest pain and palpitations.  Gastrointestinal: Positive for abdominal pain and nausea. Negative for abdominal distention, blood in stool, constipation, diarrhea and vomiting.  Genitourinary: Negative for discharge, dysuria, flank pain, frequency, penile pain, penile swelling, scrotal swelling and testicular pain.  Musculoskeletal: Negative for back pain, neck pain and neck stiffness.  Skin:  Negative for rash and wound.  Neurological: Negative for light-headedness and headaches.  Psychiatric/Behavioral: Negative for agitation.  All other systems reviewed and are negative.    Physical Exam Updated Vital Signs BP (!) 142/81   Pulse (!) 110   Temp 98.5 F (36.9 C)   Resp (!) 23   Ht 6' (1.829 m)   Wt 122.5 kg (270 lb)   SpO2 96%   BMI 36.62 kg/m   Physical Exam  Constitutional: He is oriented to person, place, and time. He appears well-developed and well-nourished.  Non-toxic appearance. He does not appear ill. No distress.  HENT:  Head: Normocephalic.  Mouth/Throat: Oropharynx is clear and moist. No oropharyngeal exudate.  Eyes: EOM are normal. Pupils are equal, round, and reactive to light. No scleral icterus.  Cardiovascular: Normal rate,  regular rhythm, normal heart sounds and intact distal pulses.  No murmur heard. Pulmonary/Chest: Effort normal. No respiratory distress. He has no wheezes. He exhibits no tenderness.  Abdominal: Normal appearance and bowel sounds are normal. He exhibits no mass. There is tenderness in the epigastric area. There is no rigidity, no rebound, no guarding and no CVA tenderness.  Neurological: He is alert and oriented to person, place, and time.  Skin: Capillary refill takes less than 2 seconds. He is not diaphoretic. No erythema.  Psychiatric: He has a normal mood and affect.  Nursing note and vitals reviewed.    ED Treatments / Results  Labs (all labs ordered are listed, but only abnormal results are displayed) Labs Reviewed  COMPREHENSIVE METABOLIC PANEL - Abnormal; Notable for the following components:      Result Value   Sodium 132 (*)    Potassium 3.2 (*)    Chloride 95 (*)    Glucose, Bld 119 (*)    All other components within normal limits  CBC WITH DIFFERENTIAL/PLATELET  LIPASE, BLOOD  TROPONIN I    EKG  EKG Interpretation  Date/Time:  Monday September 25 2017 07:49:02 EST Ventricular Rate:  112 PR Interval:      QRS Duration: 77 QT Interval:  323 QTC Calculation: 441 R Axis:   6 Text Interpretation:  Sinus tachycardia Left atrial enlargement Low voltage, precordial leads When compared to prior, similar tachycardia.  No STEMI Confirmed by Theda Belfast ([id number]) on 09/25/2017 9:07:19 AM       Radiology Dg Chest 2 View  Result Date: 09/25/2017 CLINICAL DATA:  Nausea and abdominal pain for the past month. Some chest pain in infra sternal region. Former smoker. EXAM: CHEST  2 VIEW COMPARISON:  Chest x-ray of June 06, 2017 FINDINGS: The lungs are adequately inflated and clear. The retrosternal soft tissues are normal. The heart and pulmonary vascularity are normal. The mediastinum is normal in width. The bony thorax exhibits no acute abnormality. The observed portion of the upper abdomen is normal. IMPRESSION: There is no pneumonia nor other acute cardiopulmonary abnormality. Electronically Signed   By: David  [place] M.D.   On: 09/25/2017 08:34    Procedures Procedures (including critical care time)  Medications Ordered in ED Medications  gi cocktail (Maalox,Lidocaine,Donnatal) (30 mLs Oral Given 09/25/17 0843)  ketorolac (TORADOL) 30 MG/ML injection 30 mg (30 mg Intravenous Given 09/25/17 0843)  sodium chloride 0.9 % bolus 1,000 mL (0 mLs Intravenous Stopped 09/25/17 1209)  ondansetron (ZOFRAN) injection 4 mg (4 mg Intravenous Given 09/25/17 0843)     Initial Impression / Assessment and Plan / ED Course  I have reviewed the triage vital signs and the nursing notes.  Pertinent labs & imaging results that were available during my care of the patient were reviewed by me and considered in my medical decision making (see chart for details).     Aaron [name] is a 47 y.o. male with past medical history significant for hypertension, hyperlipidemia, diverticulosis, PTSD, and recent increase in stress with loss of wife who presents with burning abdominal pain, nausea, and some lower chest pain.  Patient  reports that he has been seen several times for the symptoms and was found to have reflux likely causing his discomfort.  He describes it as a burning warm pain in his epigastrium that radiates up towards his chest.  He reports that he eats [place] every day and ate a half a near hot sauce last night that seems to exacerbate  his symptoms.  He describes that he was given a GI cocktail and pain medication recently that helped his symptoms.  He says that he ran out of his Prilosec yesterday.  He is scheduled to see his GI doctor in the near future but reports due to the loss of his wife he has had a difficult time with appointments.  He says that it is nonpleuritic and it is not exertional discomfort in his lower chest.  He denies significant hemoptysis, productive cough, fevers, chills, urinary symptoms, or other GI symptoms.  He denies trauma.  On exam, patient has epigastric tenderness.  Patient had normal bowel sounds.  Lungs were clear and chest was nontender.  Back was nontender.  Patient had pulses in all extremities.  No significant lower extremity edema or unilateral swelling.  EKG was performed showing sinus tachycardia.  Suspect tachycardia due to dehydration and pain.  Given patient's comorbidities, will obtain troponin and chest x-ray however, I suspect this is reflux type discomfort.  Patient agrees that he needs to see his GI doctor and refill some of his GI medications.  Due to discomfort pain across his upper abdomen, will also obtain screening laboratory testing.  Anticipate reassessment after testing.  Diagnostic testing overall reassuring.  Patient found to have slightly decreased potassium.  Patient will improve his diet for this.  No evidence of infection with no leukocytosis.  Hemoglobin normal doubt severe GI bleed causing symptoms.,  LFTs and lipase normal.  Troponin negative.  Chest x-ray reassuring.  Next  Patient reports his pain completely resolved after the GI cocktail.  I  feel his symptoms are due to reflux.  Patient advised that he needs to see his GI physician as soon as possible and he will reschedule his appointment.  Patient will take the Prilosec that he ran out of at a slightly increased dose.  Patient will avoid the daily [facility] and other hot sauces as this seems to aggravate his symptoms.  Patient understood follow-up instructions will return precautions.  Patient had no further symptoms and his discomfort was resolved.  Patient discharged in good condition.   Final Clinical Impressions(s) / ED Diagnoses   Final diagnoses:  Epigastric abdominal pain  Nausea    ED Discharge Orders        Ordered    omeprazole (PRILOSEC) 40 MG capsule  Daily     09/25/17 1120    ondansetron (ZOFRAN) 4 MG tablet  Every 8 hours PRN     09/25/17 1120      Clinical Impression: 1. Epigastric abdominal pain   2. Nausea     Disposition: Discharge  Condition: Good  I have discussed the results, Dx and Tx plan with the pt(& family if present). He/she/they expressed understanding and agree(s) with the plan. Discharge instructions discussed at great length. Strict return precautions discussed and pt &/or family have verbalized understanding of the instructions. No further questions at time of discharge.    This SmartLink is deprecated. Use AVSMEDLIST instead to display the medication list for a patient.  Follow Up: Sharlene Dory, DO [address] [place] [address] [phone]     [facility] EMERGENCY DEPARTMENT [address] [id number] ZD [id number] [place] [address] [phone]       Tegeler, Canary Brim, MD 09/25/17 [phone]

## 2017-09-26 NOTE — Telephone Encounter (Signed)
Yes, I recall him. He had to cancel his ECL recently after the sudden loss of his wife, and I know he is having a very hard time coping with that. He can be rescheduled at next available ECL slot, then review instructions over phone for prep/timing.  - HD

## 2017-09-26 NOTE — Telephone Encounter (Signed)
Spoke to patient, he is scheduled on 10/26/17 for a morning time, patient preference. Verbally went over prep instructions and also mailed new set of instructions to him. Confirmed that he still does have the Suprep, therefore do not need to resend that to pharmacy.

## 2017-09-29 ENCOUNTER — Encounter (HOSPITAL_BASED_OUTPATIENT_CLINIC_OR_DEPARTMENT_OTHER): Payer: Self-pay | Admitting: *Deleted

## 2017-09-29 ENCOUNTER — Emergency Department (HOSPITAL_BASED_OUTPATIENT_CLINIC_OR_DEPARTMENT_OTHER)
Admission: EM | Admit: 2017-09-29 | Discharge: 2017-09-29 | Disposition: A | Discharge status: Home / Self Care | Payer: 59 | Attending: Emergency Medicine | Admitting: Emergency Medicine

## 2017-09-29 ENCOUNTER — Other Ambulatory Visit: Payer: Self-pay

## 2017-09-29 DIAGNOSIS — F419 Anxiety disorder, unspecified: Secondary | ICD-10-CM | POA: Diagnosis not present

## 2017-09-29 DIAGNOSIS — R1013 Epigastric pain: Secondary | ICD-10-CM | POA: Diagnosis present

## 2017-09-29 DIAGNOSIS — Z79899 Other long term (current) drug therapy: Secondary | ICD-10-CM | POA: Diagnosis not present

## 2017-09-29 DIAGNOSIS — Z87891 Personal history of nicotine dependence: Secondary | ICD-10-CM | POA: Insufficient documentation

## 2017-09-29 DIAGNOSIS — I10 Essential (primary) hypertension: Secondary | ICD-10-CM | POA: Insufficient documentation

## 2017-09-29 DIAGNOSIS — F341 Dysthymic disorder: Secondary | ICD-10-CM

## 2017-09-29 DIAGNOSIS — F329 Major depressive disorder, single episode, unspecified: Secondary | ICD-10-CM | POA: Insufficient documentation

## 2017-09-29 LAB — COMPREHENSIVE METABOLIC PANEL
ALBUMIN: 4.5 g/dL (ref 3.5–5.0)
ALK PHOS: 57 U/L (ref 38–126)
ALT: 74 U/L — ABNORMAL HIGH (ref 17–63)
ANION GAP: 13 (ref 5–15)
AST: 45 U/L — ABNORMAL HIGH (ref 15–41)
BUN: 14 mg/dL (ref 6–20)
CO2: 25 mmol/L (ref 22–32)
CREATININE: 1.06 mg/dL (ref 0.61–1.24)
Calcium: 9.1 mg/dL (ref 8.9–10.3)
Chloride: 101 mmol/L (ref 101–111)
GFR calc non Af Amer: >60 mL/min (ref >60)
GLUCOSE: 112 mg/dL — AB (ref 65–99)
Potassium: 3.4 mmol/L — ABNORMAL LOW (ref 3.5–5.1)
Sodium: 139 mmol/L (ref 135–145)
TOTAL PROTEIN: 8.3 g/dL — AB (ref 6.5–8.1)
Total Bilirubin: 0.3 mg/dL (ref 0.3–1.2)

## 2017-09-29 LAB — CBC WITH DIFFERENTIAL/PLATELET
Basophils Absolute: 0.1 10*3/uL (ref 0.0–0.1)
Basophils Relative: 1 %
Eosinophils Absolute: 0 10*3/uL (ref 0.0–0.7)
Eosinophils Relative: 0 %
HEMATOCRIT: 40.2 % (ref 39.0–52.0)
HEMOGLOBIN: 14.3 g/dL (ref 13.0–17.0)
LYMPHS ABS: 1.5 10*3/uL (ref 0.7–4.0)
Lymphocytes Relative: 15 %
MCH: 31.4 pg (ref 26.0–34.0)
MCHC: 35.6 g/dL (ref 30.0–36.0)
MCV: 88.2 fL (ref 78.0–100.0)
MONOS PCT: 6 %
Monocytes Absolute: 0.6 10*3/uL (ref 0.1–1.0)
NEUTROS ABS: 7.7 10*3/uL (ref 1.7–7.7)
NEUTROS PCT: 78 %
Platelets: 357 10*3/uL (ref 150–400)
RBC: 4.56 MIL/uL (ref 4.22–5.81)
RDW: 12.4 % (ref 11.5–15.5)
WBC: 9.8 10*3/uL (ref 4.0–10.5)

## 2017-09-29 LAB — LIPASE, BLOOD: Lipase: 60 U/L — ABNORMAL HIGH (ref 11–51)

## 2017-09-29 MED ORDER — GI COCKTAIL ~~LOC~~
30.0000 mL | Freq: Once | ORAL | Status: AC
  Administered 2017-09-29: 30 mL via ORAL
  Filled 2017-09-29: qty 30

## 2017-09-29 MED ORDER — ONDANSETRON 4 MG PO TBDP
4.0000 mg | ORAL_TABLET | Freq: Once | ORAL | Status: AC
Start: 2017-09-29 — End: 2017-09-29
  Administered 2017-09-29: 4 mg via ORAL
  Filled 2017-09-29: qty 1

## 2017-09-29 MED ORDER — ALPRAZOLAM 0.5 MG PO TABS: 0.5000 mg | ORAL_TABLET | Freq: Two times a day (BID) | ORAL | 0 refills | Status: DC | PRN

## 2017-09-29 MED ORDER — ALUM & MAG HYDROXIDE-SIMETH 200-200-20 MG/5ML PO SUSP
15.0000 mL | Freq: Once | ORAL | Status: AC
  Administered 2017-09-29: 15 mL via ORAL
  Filled 2017-09-29: qty 30

## 2017-09-29 MED ORDER — LORAZEPAM 1 MG PO TABS
1.0000 mg | ORAL_TABLET | Freq: Once | ORAL | Status: AC
  Administered 2017-09-29: 1 mg via ORAL
  Filled 2017-09-29: qty 1

## 2017-09-29 MED ORDER — ONDANSETRON 4 MG PO TBDP: 4.0000 mg | ORAL_TABLET | Freq: Once | ORAL | Status: DC

## 2017-09-29 MED ORDER — HYDROXYZINE HCL 25 MG PO TABS
25.0000 mg | ORAL_TABLET | Freq: Once | ORAL | Status: AC
  Administered 2017-09-29: 25 mg via ORAL
  Filled 2017-09-29: qty 1

## 2017-09-29 MED ORDER — SODIUM CHLORIDE 0.9 % IV BOLUS (SEPSIS)
1000.0000 mL | Freq: Once | INTRAVENOUS | Status: AC
  Administered 2017-09-29: 1000 mL via INTRAVENOUS

## 2017-09-29 NOTE — ED Notes (Signed)
He is tearful at triage. States his wife committed suicide recently on their anniversary.

## 2017-09-29 NOTE — ED Notes (Signed)
Pt continues to call out for pain and reassurance that his heart is ok. EDP made aware. No new orders given. Comfort measures taken.

## 2017-09-29 NOTE — ED Notes (Signed)
Pt comes to the nurses station, very anxious, requesting pain medication. Pt assisted back to his room, explained the doctor would be in to see him shortly and comfort measures attempted. EDP made aware.

## 2017-09-29 NOTE — ED Notes (Signed)
Pt requesting something for pain, EDP made aware 

## 2017-09-29 NOTE — ED Notes (Signed)
States he feels like he is anxious and hyperventilating. Felt light headed for a minute while in the waiting area.  Burping relieves his abdominal pain.

## 2017-09-29 NOTE — ED Notes (Signed)
Pt called out several times c/o increase in pain. Pt is visibly anxious. Pt requesting another EKG be done.

## 2017-09-29 NOTE — ED Provider Notes (Signed)
[facility] EMERGENCY DEPARTMENT Provider Note   CSN: [id number] Arrival date & time: 09/29/17  1311     History   Chief Complaint Chief Complaint  Patient presents with  . Abdominal Pain    HPI Aaron [name] is a 47 y.o. male.  HPI Patient with epigastric pain.  Has been dealing for the last couple months.  Had a recent loss of his wife by suicide on the anniversary.  Has a history of PTSD depression and anxiety.  Is scheduled for an EGD in just under a month.  Has had CT scans and blood work for this.  No cardiac history.  States it gets worse when he eats something spicy states that he had some [place] pizza this morning.  No fevers or chills.  No nausea or vomiting.  Now states he feels as if he could pass out.  Pain is sharp in his epigastric pain will go up to his chest.  States it feels better when he presses onto it. Past Medical History:  Diagnosis Date  . Anxiety   . Depression   . GERD (gastroesophageal reflux disease)   . Heart murmur   . History of chicken pox   . Hyperlipidemia   . Hypertension   . PTSD (post-traumatic stress disorder)   . Sleep apnea   . Tinnitus of both ears     Patient Active Problem List   Diagnosis Date Noted  . Sleep apnea 01/09/2012  . Hypogonadism male 01/02/2012  . History of blurred vision 12/30/2011  . Dizziness 12/21/2011  . Obesity (BMI 30-39.9) 11/23/2011  . Somnolence 11/23/2011  . Nonspecific abnormal electrocardiogram (ECG) (EKG) 11/23/2011  . Umbilical hernia 11/05/2010  . ANXIETY DEPRESSION 03/10/2010  . TMJ PAIN 01/12/2009  . PALPITATIONS 12/01/2008  . TINNITUS NOS 06/13/2007  . HYPERTENSION, BENIGN ESSENTIAL 06/13/2007    Past Surgical History:  Procedure Laterality Date  . broken fingers    . HERNIA REPAIR         Home Medications    Prior to Admission medications   Medication Sig Start Date End Date Taking? Authorizing Provider  ALPRAZolam Prudy Feeler) 0.5 MG tablet Take 1 tablet (0.5 mg  total) by mouth 2 (two) times daily as needed for anxiety. 09/29/17   Benjiman Core, MD  amLODipine (NORVASC) 5 MG tablet Take 5 mg by mouth daily. 06/21/17   [provider]  ARIPiprazole (ABILIFY) 5 MG tablet Take 1 tablet (5 mg total) by mouth daily. 09/06/17   Sharlene Dory, DO  atorvastatin (LIPITOR) 80 MG tablet Take 1 tablet (80 mg total) by mouth daily. 06/26/17   Sharlene Dory, DO  FLUoxetine (PROZAC) 10 MG tablet Take 1 tablet (10 mg total) by mouth daily. 06/26/17   Sharlene Dory, DO  FLUoxetine (PROZAC) 20 MG tablet Take 1 tablet (20 mg total) by mouth daily. 06/26/17   Sharlene Dory, DO  Ginkgo Biloba Complex 440 MG CAPS Take 1 capsule by mouth daily.    [provider]  LORazepam (ATIVAN) 1 MG tablet Take 1 tablet (1 mg total) by mouth every 8 (eight) hours as needed for anxiety. 09/19/17   Khatri, Hina, PA-C  losartan-hydrochlorothiazide (HYZAAR) 100-25 MG tablet Take 1 tablet by mouth daily. 06/26/17   Sharlene Dory, DO  meclizine (ANTIVERT) 25 MG tablet Take 1 tablet (25 mg total) by mouth 3 (three) times daily as needed for dizziness. 09/06/17   Sharlene Dory, DO  metoprolol succinate (TOPROL-XL) 50 MG  24 hr tablet Take 1 tablet (50 mg total) by mouth daily. 06/26/17   Sharlene Dory, DO  Multiple Vitamin (MULTIVITAMIN) tablet Take 1 tablet by mouth daily.    [provider]  NEEDLE, DISP, 22 G 22G X 1" MISC Use to inject testosterone intramuscularly every 2 weeks. 09/06/17   Sharlene Dory, DO  Omega-3 1000 MG CAPS Take by mouth. Take 4 g by mouth daily.    [provider]  omeprazole (PRILOSEC) 20 MG capsule Take 1 capsule (20 mg total) by mouth daily. 09/19/17   Khatri, Hina, PA-C  omeprazole (PRILOSEC) 40 MG capsule Take 1 capsule (40 mg total) by mouth daily. 09/25/17   Tegeler, Canary Brim, MD  ondansetron (ZOFRAN) 4 MG tablet Take 1 tablet (4 mg total) by mouth every 8  (eight) hours as needed for nausea or vomiting. 09/25/17   Tegeler, Canary Brim, MD  pantoprazole (PROTONIX) 40 MG tablet Take 1 tablet (40 mg total) by mouth daily. 06/26/17   Sharlene Dory, DO  tadalafil (CIALIS) 20 MG tablet Take 1 tablet (20 mg total) by mouth daily as needed for erectile dysfunction. 07/04/17   Sharlene Dory, DO  testosterone cypionate (DEPOTESTOSTERONE CYPIONATE) 200 MG/ML injection Inject 1 mL (200 mg total) into the muscle every 14 (fourteen) days. 06/26/17   Sharlene Dory, DO    Family History Family History  Problem Relation Age of Onset  . Diabetes Mother   . Hyperlipidemia Mother   . Hypertension Mother   . Heart disease Mother   . Cancer Father   . Hyperlipidemia Father   . Hypertension Father   . Other Father        kidney removal  . Heart disease Father   . Kidney disease Father   . Cancer Sister   . Hypertension Sister   . Colon cancer Neg Hx   . Esophageal cancer Neg Hx   . Rectal cancer Neg Hx   . Stomach cancer Neg Hx     Social History Social History   Tobacco Use  . Smoking status: Former Smoker    Last attempt to quit: 09/19/1996    Years since quitting: 21.0  . Smokeless tobacco: Never Used  Substance Use Topics  . Alcohol use: Yes    Comment: rarely, last use yesterday  . Drug use: No     Allergies   Patient has no known allergies.   Review of Systems Review of Systems  Constitutional: Negative for appetite change.  HENT: Negative for congestion.   Cardiovascular: Positive for chest pain.  Gastrointestinal: Positive for abdominal pain and nausea.  Genitourinary: Negative for frequency.  Musculoskeletal: Negative for back pain.  Skin: Negative for color change.  Neurological: Negative for tremors.  Psychiatric/Behavioral: Negative for suicidal ideas. The patient is nervous/anxious.      Physical Exam Updated Vital Signs BP 118/65   Pulse (!) 126   Temp 97.8 F (36.6 C) (Oral)   Resp 18    Ht 6' (1.829 m)   Wt 122.5 kg (270 lb)   SpO2 97%   BMI 36.62 kg/m   Physical Exam  Constitutional: He appears well-developed.  Patient appears anxious  HENT:  Head: Atraumatic.  Eyes: Pupils are equal, round, and reactive to light.  Cardiovascular: Regular rhythm.  Tachycardia  Pulmonary/Chest: Breath sounds normal.  Abdominal: Normal appearance.  Tenderness in epigastric and xiphoid area.  No rebound or guarding.  No mass.  Neurological: He is alert.  Skin: Skin  is warm. Capillary refill takes less than 2 seconds.  Psychiatric:  patient appears anxious.     ED Treatments / Results  Labs (all labs ordered are listed, but only abnormal results are displayed) Labs Reviewed  COMPREHENSIVE METABOLIC PANEL - Abnormal; Notable for the following components:      Result Value   Potassium 3.4 (*)    Glucose, Bld 112 (*)    Total Protein 8.3 (*)    AST 45 (*)    ALT 74 (*)    All other components within normal limits  LIPASE, BLOOD - Abnormal; Notable for the following components:   Lipase 60 (*)    All other components within normal limits  CBC WITH DIFFERENTIAL/PLATELET    EKG  EKG Interpretation None       Radiology No results found.  Procedures Procedures (including critical care time)  Medications Ordered in ED Medications  gi cocktail (Maalox,Lidocaine,Donnatal) (30 mLs Oral Given 09/29/17 1546)  LORazepam (ATIVAN) tablet 1 mg (1 mg Oral Given 09/29/17 1545)  ondansetron (ZOFRAN-ODT) disintegrating tablet 4 mg (4 mg Oral Given 09/29/17 1551)  hydrOXYzine (ATARAX/VISTARIL) tablet 25 mg (25 mg Oral Given 09/29/17 1655)  alum & mag hydroxide-simeth (MAALOX/MYLANTA) 200-200-20 MG/5ML suspension 15 mL (15 mLs Oral Given 09/29/17 1747)  sodium chloride 0.9 % bolus 1,000 mL (0 mLs Intravenous Stopped 09/29/17 1938)     Initial Impression / Assessment and Plan / ED Course  I have reviewed the triage vital signs and the nursing notes.  Pertinent labs & imaging  results that were available during my care of the patient were reviewed by me and considered in my medical decision making (see chart for details).     Patient with abdominal pain and anxiety.  Has been seen frequently for same.  Lab work done reassuring due to persistent tachycardia.  Mild tenderness.  Likely site component.  Given a few anxiety medicines for home.  Reviewed previous records.  Discharge home. Final Clinical Impressions(s) / ED Diagnoses   Final diagnoses:  Epigastric pain  Anxiety    ED Discharge Orders        Ordered    ALPRAZolam (XANAX) 0.5 MG tablet  2 times daily PRN     09/29/17 1927       Benjiman CorePickering, Rachid Parham, MD 09/29/17 2347

## 2017-09-29 NOTE — ED Triage Notes (Signed)
Abdominal pain. He was seen for same 4 days ago. He has an endoscopy and colonoscopy scheduled for February.

## 2017-09-29 NOTE — ED Notes (Signed)
Pt is anxious, reports he is experiencing the same type of epigastric pain that he has been experiencing. Pt states the pain causes him to have a panic attack. Pt states he ate [place] this morning which started this episode. Pt reports he is out of his xanax for anxiety.

## 2017-10-02 ENCOUNTER — Ambulatory Visit: Payer: 59 | Admitting: Family Medicine

## 2017-10-02 VITALS — BP 161/97 | HR 96 | Temp 97.5°F | Ht 72.0 in | Wt 270.0 lb

## 2017-10-02 DIAGNOSIS — H9192 Unspecified hearing loss, left ear: Secondary | ICD-10-CM | POA: Diagnosis not present

## 2017-10-02 DIAGNOSIS — R1084 Generalized abdominal pain: Secondary | ICD-10-CM | POA: Diagnosis not present

## 2017-10-02 MED ORDER — DICYCLOMINE HCL 10 MG PO CAPS: 10.0000 mg | ORAL_CAPSULE | Freq: Three times a day (TID) | ORAL | 1 refills | Status: DC

## 2017-10-02 MED FILL — FLUoxetine HCL 20 MG CAPS: 20 | 30 days supply | Qty: 30 | Fill #2

## 2017-10-02 MED FILL — FLUoxetine HCL 10 MG CAPS: 10 | 30 days supply | Qty: 30 | Fill #2

## 2017-10-02 MED FILL — METOPROLOL SUCC ER 50 MG TA: 50 | 30 days supply | Qty: 30 | Fill #3

## 2017-10-02 MED FILL — PANTOPRAZOLE SOD DR 40 MG T: 40 | 30 days supply | Qty: 30 | Fill #3

## 2017-10-02 MED FILL — ATORVASTATIN 80 MG TABLET: 80 | 30 days supply | Qty: 30 | Fill #3

## 2017-10-02 MED FILL — BD NEEDLES 22GX1: 22G X 1" | 27 days supply | Qty: 2 | Fill #0

## 2017-10-02 MED FILL — TESTOSTERONE CYPIONATE 200: 200 | 28 days supply | Qty: 2 | Fill #3

## 2017-10-02 MED FILL — AMLODIPINE BESYLATE 5 MG TA: 5 | 30 days supply | Qty: 30 | Fill #3

## 2017-10-02 MED FILL — BD NEEDLES 22GX1": 22G X 1" | 27 days supply | Qty: 2 | Fill #0

## 2017-10-02 MED FILL — DICYCLOMINE 10 MG CAPSULE: 10 | 15 days supply | Qty: 60 | Fill #0

## 2017-10-02 MED FILL — LOSARTAN-HCTZ 100-25 MG TAB: 100-25 | 30 days supply | Qty: 30 | Fill #3

## 2017-10-02 NOTE — Progress Notes (Signed)
Pre visit review using our clinic tool,if applicable. No additional management support is needed unless otherwise documented below in the visit note.  

## 2017-10-02 NOTE — Progress Notes (Signed)
Chief Complaint  Patient presents with  . Abdominal Pain    Subjective: Patient is a 47 y.o. [name] here for abdominal pain.  He is here with his father.  The patient presents for a several month history of worsening epigastric abdominal pain.  It is sharp and he describes it as expanding throughout the rest of the stomach.  He describes it as cramping.  He has been to the emergency department several times and has an EGD and colonoscopy scheduled on 10/26/17 with the gastroenterology team.  He did report some bleeding on 09/29/17 and labs were unremarkable.  He does have a history of anxiety/depression, PTSD, and bereavement.  He was recommended to see psychiatry, however he is not set this up yet.  The pain is worse after he eats, particularly something spicy.  He continues to take Protonix and Zantac daily.  He is tolerating these medicines well.  He is not having any unintentional weight loss.  He continues to drink alcohol excessively.  Additionally, he notes that he relapsed and had Adderall the other day while drinking.   Lastly, he mentioned that he has chronic hearing loss of the left ear that he believes is related to his vertigo.  He was referred to vestibular rehab in the past but did not go because it resolved on its own.  ROS: Constitutional: Denies fevers GI: As noted in the HPI  Family History  Problem Relation Age of Onset  . Diabetes Mother   . Hyperlipidemia Mother   . Hypertension Mother   . Heart disease Mother   . Cancer Father   . Hyperlipidemia Father   . Hypertension Father   . Other Father        kidney removal  . Heart disease Father   . Kidney disease Father   . Cancer Sister   . Hypertension Sister   . Colon cancer Neg Hx   . Esophageal cancer Neg Hx   . Rectal cancer Neg Hx   . Stomach cancer Neg Hx    Past Medical History:  Diagnosis Date  . Anxiety   . Depression   . GERD (gastroesophageal reflux disease)   . Heart murmur   . History of chicken pox    . Hyperlipidemia   . Hypertension   . PTSD (post-traumatic stress disorder)   . Sleep apnea   . Tinnitus of both ears    No Known Allergies  Current Outpatient Medications:  .  ALPRAZolam (XANAX) 0.5 MG tablet, Take 1 tablet (0.5 mg total) by mouth 2 (two) times daily as needed for anxiety., Disp: 8 tablet, Rfl: 0 .  amLODipine (NORVASC) 5 MG tablet, Take 5 mg by mouth daily., Disp: , Rfl:  .  atorvastatin (LIPITOR) 80 MG tablet, Take 1 tablet (80 mg total) by mouth daily., Disp: 90 tablet, Rfl: 1 .  FLUoxetine (PROZAC) 10 MG tablet, Take 1 tablet (10 mg total) by mouth daily., Disp: 30 tablet, Rfl: 3 .  FLUoxetine (PROZAC) 20 MG tablet, Take 1 tablet (20 mg total) by mouth daily., Disp: 90 tablet, Rfl: 1 .  Ginkgo Biloba Complex 440 MG CAPS, Take 1 capsule by mouth daily., Disp: , Rfl:  .  losartan-hydrochlorothiazide (HYZAAR) 100-25 MG tablet, Take 1 tablet by mouth daily., Disp: 90 tablet, Rfl: 1 .  meclizine (ANTIVERT) 25 MG tablet, Take 1 tablet (25 mg total) by mouth 3 (three) times daily as needed for dizziness., Disp: 30 tablet, Rfl: 1 .  metoprolol succinate (TOPROL-XL) 50 MG  24 hr tablet, Take 1 tablet (50 mg total) by mouth daily., Disp: 90 tablet, Rfl: 1 .  Multiple Vitamin (MULTIVITAMIN) tablet, Take 1 tablet by mouth daily., Disp: , Rfl:  .  NEEDLE, DISP, 22 G 22G X 1" MISC, Use to inject testosterone intramuscularly every 2 weeks., Disp: 25 each, Rfl: 0 .  Omega-3 1000 MG CAPS, Take by mouth. Take 4 g by mouth daily., Disp: , Rfl:  .  ondansetron (ZOFRAN) 4 MG tablet, Take 1 tablet (4 mg total) by mouth every 8 (eight) hours as needed for nausea or vomiting., Disp: 12 tablet, Rfl: 0 .  pantoprazole (PROTONIX) 40 MG tablet, Take 1 tablet (40 mg total) by mouth daily., Disp: 90 tablet, Rfl: 1 .  testosterone cypionate (DEPOTESTOSTERONE CYPIONATE) 200 MG/ML injection, Inject 1 mL (200 mg total) into the muscle every 14 (fourteen) days., Disp: 10 mL, Rfl: 0 .  ARIPiprazole  (ABILIFY) 5 MG tablet, Take 1 tablet (5 mg total) by mouth daily. (Patient not taking: Reported on 10/02/2017), Disp: 30 tablet, Rfl: 2 .  dicyclomine (BENTYL) 10 MG capsule, Take 1 capsule (10 mg total) by mouth 4 (four) times daily -  before meals and at bedtime., Disp: 60 capsule, Rfl: 1  Objective: BP (!) 161/97   Pulse 96   Temp (!) 97.5 F (36.4 C) (Oral)   Ht 6' (1.829 m)   Wt 270 lb (122.5 kg)   SpO2 97%   BMI 36.Aaron kg/m  General: Awake, appears stated age HEENT: MMM, EOMi Heart: RRR, Lungs: CTAB, no rales, wheezes or rhonchi. No accessory muscle use Abd: BS+, soft, diffusely tender to palpation, worse in the epigastric region,+ diastasi no masses or organomegaly s recti,  Psych: Age appropriate judgment and insight, normal affect and mood, he did become tearful during the examination  Assessment and Plan: Generalized abdominal pain - Plan: dicyclomine (BENTYL) 10 MG capsule  Hearing loss of left ear, unspecified hearing loss type - Plan: Ambulatory referral to ENT  Orders as above. Continue Protonix and Zantac.  Keep appointment with a GI team.  Avoid aggravating foods. He was strongly encouraged to contact the psychiatry team. We will refer him to the ENT team to further characterize his hearing loss and see if it is related to his vertigo. He did bring up permanent disability rather than temporary.  I told him that as things are now, I cannot  ethically recommend full-time versus temporary because there are still specialist he needs to see. Follow-up as needed for this issue. The patient voiced understanding and agreement to the plan.  Greater than 25 minutes were spent face to face with the patient with greater than 50% of this time spent counseling on hearing loss management, psych issues, GI issues, reflux and ulcers.    Jilda Rocheicholas Paul NorwichWendling, DO 10/02/17  12:14 PM

## 2017-10-02 NOTE — Patient Instructions (Addendum)
[facility] [address] [place], [place] [address] [phone]  [facility] [address] [place], [place] [address] [phone]  [facility] [address] [place], [place] [address] [phone]  Call one of these offices sooner than later as it can take 2-3 months to get a new patient appointment.   Call Dr. Myrtie Neitheranis if this does not improve.   Continue Protonix and Zantac.   If you do not hear anything about your referral in the next 1-2 weeks, call our office and ask for an update.

## 2017-10-04 ENCOUNTER — Telehealth: Payer: Self-pay | Admitting: Family Medicine

## 2017-10-04 NOTE — Telephone Encounter (Signed)
No, let's hold off on this until we can be sure he is not drinking as much alcohol. I do not think this medicine is safe for him as is. Ty.

## 2017-10-04 NOTE — Telephone Encounter (Signed)
Called pharmacy at [facility] to inform refill denied. Faxed denial to pharmacy as well.

## 2017-10-04 NOTE — Telephone Encounter (Signed)
Last refill for Alprazolam was on 09/29/2017 (by another MD not PCP) Last office visit on 10/02/2017 Advise on Alprazolam 0.5 mg tablet refill--pharmacy requesting #[phone]

## 2017-10-09 ENCOUNTER — Ambulatory Visit: Payer: 59 | Admitting: Physical Therapy

## 2017-10-10 ENCOUNTER — Telehealth: Payer: Self-pay | Admitting: Family Medicine

## 2017-10-10 NOTE — Telephone Encounter (Signed)
Copied from CRM [phone]. Topic: Quick Communication - Other Results >> Oct 10, 2017  1:05 PM Valentina LucksMatos, Jackelin wrote: Pt never picked up document that was filled out by provider ([employer] Screening [employer]- [employer]). Document mailed to pt.

## 2017-10-12 ENCOUNTER — Encounter: Payer: Self-pay | Admitting: Gastroenterology

## 2017-10-17 ENCOUNTER — Other Ambulatory Visit: Payer: Self-pay

## 2017-10-17 ENCOUNTER — Encounter (HOSPITAL_BASED_OUTPATIENT_CLINIC_OR_DEPARTMENT_OTHER): Payer: Self-pay | Admitting: *Deleted

## 2017-10-17 DIAGNOSIS — F410 Panic disorder [episodic paroxysmal anxiety] without agoraphobia: Secondary | ICD-10-CM | POA: Diagnosis not present

## 2017-10-17 DIAGNOSIS — E876 Hypokalemia: Secondary | ICD-10-CM | POA: Diagnosis not present

## 2017-10-17 DIAGNOSIS — R1013 Epigastric pain: Secondary | ICD-10-CM | POA: Insufficient documentation

## 2017-10-17 DIAGNOSIS — Z79899 Other long term (current) drug therapy: Secondary | ICD-10-CM | POA: Insufficient documentation

## 2017-10-17 DIAGNOSIS — Z87891 Personal history of nicotine dependence: Secondary | ICD-10-CM | POA: Diagnosis not present

## 2017-10-17 DIAGNOSIS — I10 Essential (primary) hypertension: Secondary | ICD-10-CM | POA: Insufficient documentation

## 2017-10-17 NOTE — ED Notes (Signed)
Pt reports that his hands are cold and that he took a xanax PTA.

## 2017-10-17 NOTE — ED Triage Notes (Signed)
Pt very anxious. Reports that he has epigastric pain that is recurrent but is worse tonight than it has ever been. Reports eating chinese food tonight.

## 2017-10-18 ENCOUNTER — Emergency Department (HOSPITAL_BASED_OUTPATIENT_CLINIC_OR_DEPARTMENT_OTHER)
Admission: EM | Admit: 2017-10-18 | Discharge: 2017-10-18 | Disposition: A | Discharge status: Home / Self Care | Payer: 59 | Source: Home / Self Care | Attending: Emergency Medicine | Admitting: Emergency Medicine

## 2017-10-18 ENCOUNTER — Emergency Department (HOSPITAL_BASED_OUTPATIENT_CLINIC_OR_DEPARTMENT_OTHER): Payer: 59

## 2017-10-18 ENCOUNTER — Ambulatory Visit: Payer: 59 | Admitting: Family Medicine

## 2017-10-18 ENCOUNTER — Emergency Department (HOSPITAL_BASED_OUTPATIENT_CLINIC_OR_DEPARTMENT_OTHER)
Admission: EM | Admit: 2017-10-18 | Discharge: 2017-10-18 | Disposition: A | Discharge status: Home / Self Care | Payer: 59 | Attending: Emergency Medicine | Admitting: Emergency Medicine

## 2017-10-18 ENCOUNTER — Other Ambulatory Visit: Payer: Self-pay

## 2017-10-18 ENCOUNTER — Encounter (HOSPITAL_BASED_OUTPATIENT_CLINIC_OR_DEPARTMENT_OTHER): Payer: Self-pay

## 2017-10-18 ENCOUNTER — Encounter: Payer: Self-pay | Admitting: Family Medicine

## 2017-10-18 DIAGNOSIS — F410 Panic disorder [episodic paroxysmal anxiety] without agoraphobia: Secondary | ICD-10-CM

## 2017-10-18 DIAGNOSIS — E876 Hypokalemia: Secondary | ICD-10-CM

## 2017-10-18 DIAGNOSIS — F419 Anxiety disorder, unspecified: Secondary | ICD-10-CM | POA: Insufficient documentation

## 2017-10-18 DIAGNOSIS — R1013 Epigastric pain: Secondary | ICD-10-CM

## 2017-10-18 DIAGNOSIS — G8929 Other chronic pain: Secondary | ICD-10-CM

## 2017-10-18 DIAGNOSIS — R10816 Epigastric abdominal tenderness: Secondary | ICD-10-CM | POA: Insufficient documentation

## 2017-10-18 DIAGNOSIS — Z87891 Personal history of nicotine dependence: Secondary | ICD-10-CM

## 2017-10-18 DIAGNOSIS — Z79899 Other long term (current) drug therapy: Secondary | ICD-10-CM

## 2017-10-18 DIAGNOSIS — I10 Essential (primary) hypertension: Secondary | ICD-10-CM | POA: Insufficient documentation

## 2017-10-18 DIAGNOSIS — R109 Unspecified abdominal pain: Principal | ICD-10-CM

## 2017-10-18 LAB — CBC WITH DIFFERENTIAL/PLATELET
BASOS ABS: 0.1 10*3/uL (ref 0.0–0.1)
BASOS PCT: 1 %
EOS PCT: 1 %
Eosinophils Absolute: 0.1 10*3/uL (ref 0.0–0.7)
HCT: 37.8 % — ABNORMAL LOW (ref 39.0–52.0)
Hemoglobin: 13.2 g/dL (ref 13.0–17.0)
LYMPHS PCT: 21 %
Lymphs Abs: 1 10*3/uL (ref 0.7–4.0)
MCH: 31.2 pg (ref 26.0–34.0)
MCHC: 34.9 g/dL (ref 30.0–36.0)
MCV: 89.4 fL (ref 78.0–100.0)
Monocytes Absolute: 0.5 10*3/uL (ref 0.1–1.0)
Monocytes Relative: 11 %
NEUTROS ABS: 3.2 10*3/uL (ref 1.7–7.7)
Neutrophils Relative %: 66 %
PLATELETS: 292 10*3/uL (ref 150–400)
RBC: 4.23 MIL/uL (ref 4.22–5.81)
RDW: 12.2 % (ref 11.5–15.5)
WBC: 4.8 10*3/uL (ref 4.0–10.5)

## 2017-10-18 LAB — TROPONIN I: Troponin I: <0.03 ng/mL (ref <0.03)

## 2017-10-18 LAB — COMPREHENSIVE METABOLIC PANEL
ALBUMIN: 4 g/dL (ref 3.5–5.0)
ALT: 56 U/L (ref 17–63)
AST: 53 U/L — AB (ref 15–41)
Alkaline Phosphatase: 53 U/L (ref 38–126)
Anion gap: 17 — ABNORMAL HIGH (ref 5–15)
BUN: 14 mg/dL (ref 6–20)
CHLORIDE: 99 mmol/L — AB (ref 101–111)
CO2: 21 mmol/L — AB (ref 22–32)
Calcium: 8.6 mg/dL — ABNORMAL LOW (ref 8.9–10.3)
Creatinine, Ser: 1.08 mg/dL (ref 0.61–1.24)
GFR calc Af Amer: >60 mL/min (ref >60)
GLUCOSE: 153 mg/dL — AB (ref 65–99)
POTASSIUM: 2.8 mmol/L — AB (ref 3.5–5.1)
Sodium: 137 mmol/L (ref 135–145)
Total Bilirubin: 0.5 mg/dL (ref 0.3–1.2)
Total Protein: 7.5 g/dL (ref 6.5–8.1)

## 2017-10-18 LAB — ETHANOL: Alcohol, Ethyl (B): <10 mg/dL (ref <10)

## 2017-10-18 LAB — LIPASE, BLOOD: Lipase: 29 U/L (ref 11–51)

## 2017-10-18 MED ORDER — GI COCKTAIL ~~LOC~~
30.0000 mL | Freq: Once | ORAL | Status: AC
  Administered 2017-10-18: 30 mL via ORAL
  Filled 2017-10-18: qty 30

## 2017-10-18 MED ORDER — POTASSIUM CHLORIDE CRYS ER 20 MEQ PO TBCR: 20.0000 meq | EXTENDED_RELEASE_TABLET | Freq: Two times a day (BID) | ORAL | 0 refills | Status: DC

## 2017-10-18 MED ORDER — GI COCKTAIL ~~LOC~~
30.0000 mL | Freq: Once | ORAL | Status: AC
  Administered 2017-10-18: 30 mL via ORAL

## 2017-10-18 MED ORDER — POTASSIUM CHLORIDE 10 MEQ/100ML IV SOLN
10.0000 meq | INTRAVENOUS | Status: AC
  Administered 2017-10-18 (×2): 10 meq via INTRAVENOUS
  Filled 2017-10-18 (×2): qty 100

## 2017-10-18 MED ORDER — DIPHENHYDRAMINE HCL 50 MG/ML IJ SOLN
50.0000 mg | Freq: Once | INTRAMUSCULAR | Status: AC
  Administered 2017-10-18: 50 mg via INTRAVENOUS
  Filled 2017-10-18: qty 1

## 2017-10-18 MED ORDER — GI COCKTAIL ~~LOC~~
ORAL | Status: AC
  Filled 2017-10-18: qty 30

## 2017-10-18 MED ORDER — LORAZEPAM 2 MG/ML IJ SOLN
2.0000 mg | Freq: Once | INTRAMUSCULAR | Status: AC
  Administered 2017-10-18: 2 mg via INTRAVENOUS
  Filled 2017-10-18: qty 1

## 2017-10-18 MED ORDER — HALOPERIDOL LACTATE 5 MG/ML IJ SOLN
5.0000 mg | Freq: Once | INTRAMUSCULAR | Status: DC
  Filled 2017-10-18: qty 1

## 2017-10-18 MED ORDER — POTASSIUM CHLORIDE CRYS ER 20 MEQ PO TBCR
40.0000 meq | EXTENDED_RELEASE_TABLET | Freq: Once | ORAL | Status: AC
  Administered 2017-10-18: 40 meq via ORAL
  Filled 2017-10-18: qty 2

## 2017-10-18 MED ORDER — LORAZEPAM 2 MG/ML IJ SOLN
2.0000 mg | Freq: Once | INTRAMUSCULAR | Status: AC
  Administered 2017-10-18: 2 mg via INTRAMUSCULAR
  Filled 2017-10-18: qty 1

## 2017-10-18 MED ORDER — HALOPERIDOL LACTATE 5 MG/ML IJ SOLN
5.0000 mg | Freq: Once | INTRAMUSCULAR | Status: AC
  Administered 2017-10-18: 5 mg via INTRAMUSCULAR

## 2017-10-18 NOTE — Discharge Instructions (Signed)
He was seen today for abdominal pain and panic attack.  Your workup is reassuring.  Follow-up with your primary doctor and psychologist regarding adjustments of medications for your anxiety.  Take potassium for the next 5 days.  Follow-up with your primary physician for potassium recheck.  Follow-up with GI as scheduled.

## 2017-10-18 NOTE — ED Provider Notes (Addendum)
[facility] EMERGENCY DEPARTMENT Provider Note   CSN: [id number] Arrival date & time: 10/18/17  2047     History   Chief Complaint Chief Complaint  Patient presents with  . Abdominal Pain    HPI Aaron [name] is a 47 y.o. male.  Patient was 6 visit this month for same complaint.  Patient was just discharged around 5 in the morning for exact same presentation.  Patient's been getting severe attacks of epigastric abdominal pain.  Workups have been negative.  Patient already on antianxiety medicine antidepressive medicine has a history of posttraumatic stress disorder.  His primary care doctor in the ED visits have been unable to the cause of the epigastric abdominal pain but there certainly is a panic attack component to it.  Labs this morning including troponin without any acute findings.  Was tachycardic on EKG at that presentation.  On today's presentation not tachycardic.  Patient did settle down with some conversation.  And opted to try GI cocktail and Ativan which has been helpful in the past.  Patient does have follow-up with GI medicine.      Past Medical History:  Diagnosis Date  . Anxiety   . Depression   . GERD (gastroesophageal reflux disease)   . Heart murmur   . History of chicken pox   . Hyperlipidemia   . Hypertension   . PTSD (post-traumatic stress disorder)   . Sleep apnea   . Tinnitus of both ears     Patient Active Problem List   Diagnosis Date Noted  . Hearing loss of left ear 10/02/2017  . Sleep apnea 01/09/2012  . Hypogonadism male 01/02/2012  . History of blurred vision 12/30/2011  . Dizziness 12/21/2011  . Obesity (BMI 30-39.9) 11/23/2011  . Somnolence 11/23/2011  . Nonspecific abnormal electrocardiogram (ECG) (EKG) 11/23/2011  . Umbilical hernia 11/05/2010  . ANXIETY DEPRESSION 03/10/2010  . TMJ PAIN 01/12/2009  . PALPITATIONS 12/01/2008  . TINNITUS NOS 06/13/2007  . HYPERTENSION, BENIGN ESSENTIAL 06/13/2007    Past  Surgical History:  Procedure Laterality Date  . broken fingers    . HERNIA REPAIR         Home Medications    Prior to Admission medications   Medication Sig Start Date End Date Taking? Authorizing Provider  ALPRAZolam Prudy Feeler(XANAX) 0.5 MG tablet Take 1 tablet (0.5 mg total) by mouth 2 (two) times daily as needed for anxiety. 09/29/17   Benjiman CorePickering, Nathan, MD  amLODipine (NORVASC) 5 MG tablet Take 5 mg by mouth daily. 06/21/17   [provider]  ARIPiprazole (ABILIFY) 5 MG tablet Take 1 tablet (5 mg total) by mouth daily. Patient not taking: Reported on 10/02/2017 09/06/17   Sharlene DoryWendling, Nicholas Paul, DO  atorvastatin (LIPITOR) 80 MG tablet Take 1 tablet (80 mg total) by mouth daily. 06/26/17   Sharlene DoryWendling, Nicholas Paul, DO  dicyclomine (BENTYL) 10 MG capsule Take 1 capsule (10 mg total) by mouth 4 (four) times daily -  before meals and at bedtime. 10/02/17   Sharlene DoryWendling, Nicholas Paul, DO  FLUoxetine (PROZAC) 10 MG tablet Take 1 tablet (10 mg total) by mouth daily. 06/26/17   Sharlene DoryWendling, Nicholas Paul, DO  FLUoxetine (PROZAC) 20 MG tablet Take 1 tablet (20 mg total) by mouth daily. 06/26/17   Sharlene DoryWendling, Nicholas Paul, DO  Ginkgo Biloba Complex 440 MG CAPS Take 1 capsule by mouth daily.    [provider]  losartan-hydrochlorothiazide (HYZAAR) 100-25 MG tablet Take 1 tablet by mouth daily. 06/26/17   Arva ChafeWendling, Nicholas  Renae Fickle, DO  meclizine (ANTIVERT) 25 MG tablet Take 1 tablet (25 mg total) by mouth 3 (three) times daily as needed for dizziness. 09/06/17   Sharlene Dory, DO  metoprolol succinate (TOPROL-XL) 50 MG 24 hr tablet Take 1 tablet (50 mg total) by mouth daily. 06/26/17   Sharlene Dory, DO  Multiple Vitamin (MULTIVITAMIN) tablet Take 1 tablet by mouth daily.    [provider]  NEEDLE, DISP, 22 G 22G X 1" MISC Use to inject testosterone intramuscularly every 2 weeks. 09/06/17   Sharlene Dory, DO  Omega-3 1000 MG CAPS Take by mouth. Take 4 g by mouth  daily.    [provider]  ondansetron (ZOFRAN) 4 MG tablet Take 1 tablet (4 mg total) by mouth every 8 (eight) hours as needed for nausea or vomiting. 09/25/17   Tegeler, Canary Brim, MD  pantoprazole (PROTONIX) 40 MG tablet Take 1 tablet (40 mg total) by mouth daily. 06/26/17   Sharlene Dory, DO  potassium chloride SA (K-DUR,KLOR-CON) 20 MEQ tablet Take 1 tablet (20 mEq total) by mouth 2 (two) times daily. 10/18/17   Horton, Mayer Masker, MD  testosterone cypionate (DEPOTESTOSTERONE CYPIONATE) 200 MG/ML injection Inject 1 mL (200 mg total) into the muscle every 14 (fourteen) days. 06/26/17   Sharlene Dory, DO    Family History Family History  Problem Relation Age of Onset  . Diabetes Mother   . Hyperlipidemia Mother   . Hypertension Mother   . Heart disease Mother   . Cancer Father   . Hyperlipidemia Father   . Hypertension Father   . Other Father        kidney removal  . Heart disease Father   . Kidney disease Father   . Cancer Sister   . Hypertension Sister   . Colon cancer Neg Hx   . Esophageal cancer Neg Hx   . Rectal cancer Neg Hx   . Stomach cancer Neg Hx     Social History Social History   Tobacco Use  . Smoking status: Former Smoker    Last attempt to quit: 09/19/1996    Years since quitting: 21.0  . Smokeless tobacco: Never Used  Substance Use Topics  . Alcohol use: Yes    Comment: rarely, last use yesterday  . Drug use: No     Allergies   Patient has no known allergies.   Review of Systems Review of Systems  Constitutional: Negative for fever.  HENT: Negative for congestion.   Eyes: Negative for visual disturbance.  Respiratory: Positive for shortness of breath.   Cardiovascular: Positive for chest pain.  Gastrointestinal: Positive for abdominal pain.  Genitourinary: Negative for dysuria.  Musculoskeletal: Negative for back pain.  Skin: Negative for rash.  Neurological: Positive for numbness. Negative for headaches.    Hematological: Does not bruise/bleed easily.  Psychiatric/Behavioral: Positive for sleep disturbance. Negative for suicidal ideas. The patient is nervous/anxious.      Physical Exam Updated Vital Signs BP 140/75 (BP Location: Right Arm)   Pulse 92   Temp 97.6 F (36.4 C) (Oral)   Resp (!) 21   Ht 1.829 m (6')   Wt 122.5 kg (270 lb)   SpO2 98%   BMI 36.62 kg/m   Physical Exam  Constitutional: He is oriented to person, place, and time. He appears well-developed and well-nourished. He appears distressed.  HENT:  Head: Normocephalic and atraumatic.  Mouth/Throat: Oropharynx is clear and moist.  Eyes: Conjunctivae and EOM are normal. Pupils  are equal, round, and reactive to light.  Neck: Normal range of motion. Neck supple.  Cardiovascular: Normal rate, regular rhythm and normal heart sounds.  Pulmonary/Chest: Effort normal and breath sounds normal. No respiratory distress.  Abdominal: Soft. Bowel sounds are normal. There is tenderness. There is no guarding.  Tenderness to palpation epigastric area.  Musculoskeletal: Normal range of motion.  Neurological: He is alert and oriented to person, place, and time. No cranial nerve deficit or sensory deficit. He exhibits normal muscle tone. Coordination normal.  Skin: Skin is warm.  Nursing note and vitals reviewed.    ED Treatments / Results  Labs (all labs ordered are listed, but only abnormal results are displayed) Labs Reviewed - No data to display  EKG  EKG Interpretation  Date/Time:  Wednesday October 18 2017 21:01:26 EST Ventricular Rate:  87 PR Interval:    QRS Duration: 91 QT Interval:  376 QTC Calculation: 453 R Axis:   21 Text Interpretation:  Sinus rhythm Low voltage, precordial leads Confirmed by Vanetta Mulders [phone]) on 10/18/2017 10:08:58 PM       Radiology Dg Abdomen Acute W/chest  Result Date: 10/18/2017 CLINICAL DATA:  48 y/o  M; epigastric pain. EXAM: DG ABDOMEN ACUTE W/ 1V CHEST COMPARISON:   09/25/2017 chest radiograph. 02/06/2017 CT abdomen and pelvis. FINDINGS: There is no evidence of dilated bowel loops or free intraperitoneal air. Hernia repair mesh noted. No radiopaque calculi or other significant radiographic abnormality is seen. Heart size and mediastinal contours are within normal limits. Both lungs are clear. IMPRESSION: Negative abdominal radiographs.  No acute cardiopulmonary disease. Electronically Signed   By: Mitzi Hansen M.D.   On: 10/18/2017 02:38    Procedures Procedures (including critical care time)  Medications Ordered in ED Medications  gi cocktail (Maalox,Lidocaine,Donnatal) (30 mLs Oral Given 10/18/17 2229)  LORazepam (ATIVAN) injection 2 mg (2 mg Intravenous Given 10/18/17 2229)     Initial Impression / Assessment and Plan / ED Course  I have reviewed the triage vital signs and the nursing notes.  Pertinent labs & imaging results that were available during my care of the patient were reviewed by me and considered in my medical decision making (see chart for details).     Patient symptoms seem to be a mix of chronic intermittent recurrent epigastric abdominal pain and anxiety and panic attack.  Patient improved here significantly GI cocktail and 2 of Ativan.  Symptoms of an ongoing for over a month.  Workups have been negative.  Has had a referral to GI medicine which is pending.  Patient just seen with extensive lab work earlier today.  Today's EKG without any significant changes oxygen saturation on room air 100% not even tachycardic despite the anxiety.  Overall patient feeling better.  Patient okay with discharge home.  Final Clinical Impressions(s) / ED Diagnoses   Final diagnoses:  Chronic abdominal pain  Anxiety    ED Discharge Orders    None       Vanetta Mulders, MD 10/18/17 [id number]    Vanetta Mulders, MD 10/18/17 2302

## 2017-10-18 NOTE — ED Notes (Signed)
PT reports that he feels like he is going to pass out. Patient tense and yelling that his heart is going to stop. Pt c/o epigatric pain that shoots up into his chest. Pt crying about his wife that recently passed away. Pt states his father had a cardiac arrest event and is fearful he will have the same.

## 2017-10-18 NOTE — ED Notes (Signed)
Pt beginning to calm down. Respirations down to 20 and regular.

## 2017-10-18 NOTE — ED Notes (Signed)
PT discharged to home with mother. NAD.  

## 2017-10-18 NOTE — ED Notes (Signed)
Patient called out 2 times in a row stating he was in severe pain, I went to check on patient advised patient his nurse was with another patient at the moment and he states that his pain was back in his chest and patient gripping the side rails and pushing up saying it helps with the pain, Rechecked his vitals and explained they were all within normal limits, Patient mother states "It would be nice if the doctor would come in we have been here an hour" I explained that the doctor is making his rounds as fast as he can as we only have one doctor and one PA. Coached patient on deep breathing and to relax, patient voiced understanding but states it is not working patient seems upset RN crystal notified.

## 2017-10-18 NOTE — ED Notes (Signed)
Pt ambulated to restroom. States he feels much better now.

## 2017-10-18 NOTE — ED Notes (Signed)
EDP at bedside. Pt is screaming and is hyperventilating at a rate of 38. Attempted to calm pt with therapeutic visualization without success. Pt grabbing bed and straining when he yells.

## 2017-10-18 NOTE — ED Notes (Signed)
Pt yelled from his room with the door closed, when nurse went in the room pt stated that he was having epigastric pain.  Nurse clarified with the patient that he does not need to yell, that he needs to use his call ball should he need anything, pt verbalized understanding and nurse stated that she would be in in a minute to see him.  Nurse then put patient on the rest of the monitor and the pulse ox, and pt became agitated and stated that he was going to leave if "you're going to fucking treat me like that."  Nurse replied that she asked him to use his call bell and not shout, and pt proceeded to call nurse a"bitch," take the pulse ox off his finger and throw it at the nurse and stated that he wanted to leave.  Nurse informed pt that it was his choice, and disconnected him from the monitor.  Security at bedside.

## 2017-10-18 NOTE — ED Notes (Addendum)
Upon cleaning room after pt was discharged, the stretcher was found to have a broken side rail from where pt was pulling on the railing, where the metal frame is bent and does not go up and down properly.

## 2017-10-18 NOTE — ED Notes (Signed)
Per the pt's son, the pt has had 5-6 energy drinks today.

## 2017-10-18 NOTE — Discharge Instructions (Signed)
Continue your current medications.  Make an appointment to follow-up with your primary.  Keep your appointment to follow-up with GI medicine.

## 2017-10-18 NOTE — ED Triage Notes (Signed)
C/o abd pain and chest "tightness"-was seen for same yesterday-states he felt like he was going to pass out approx 45 min PTA

## 2017-10-18 NOTE — ED Notes (Signed)
Pt discharged to home with family. NAD.  

## 2017-10-18 NOTE — ED Provider Notes (Signed)
[facility] EMERGENCY DEPARTMENT Provider Note   CSN: [id number] Arrival date & time: 10/17/17  2334     History   Chief Complaint Chief Complaint  Patient presents with  . Abdominal Pain    HPI Aaron [name] is a 47 y.o. male.  HPI  This is a 47 year old male with a history of reflux, hypertension, PTSD, depression, anxiety who presents with abdominal pain.  On initial evaluation, patient is very anxious and agitated.  He is screaming stating that his abdomen hurts from the bottom into his chest.  This is similar to prior.  His sister is the bedside.  He is scheduled for an EGD next month.  He has had a full workup including CT scans and blood work without any significant diagnosis.  On initial evaluation, patient is too agitated and worked up to answer any additional questions.  Level 5 caveat for acuity of condition  4:18 AM On reexamination after multiple sedating medications, patient is now calm and apologetic.  States that this was the worst anxiety attack he is ever had.  He is physical symptoms have subsided.  He is no longer having any abdominal pain.  He denies any recent changes in medications, fevers, illnesses.  Past Medical History:  Diagnosis Date  . Anxiety   . Depression   . GERD (gastroesophageal reflux disease)   . Heart murmur   . History of chicken pox   . Hyperlipidemia   . Hypertension   . PTSD (post-traumatic stress disorder)   . Sleep apnea   . Tinnitus of both ears     Patient Active Problem List   Diagnosis Date Noted  . Hearing loss of left ear 10/02/2017  . Sleep apnea 01/09/2012  . Hypogonadism male 01/02/2012  . History of blurred vision 12/30/2011  . Dizziness 12/21/2011  . Obesity (BMI 30-39.9) 11/23/2011  . Somnolence 11/23/2011  . Nonspecific abnormal electrocardiogram (ECG) (EKG) 11/23/2011  . Umbilical hernia 11/05/2010  . ANXIETY DEPRESSION 03/10/2010  . TMJ PAIN 01/12/2009  . PALPITATIONS 12/01/2008  .  TINNITUS NOS 06/13/2007  . HYPERTENSION, BENIGN ESSENTIAL 06/13/2007    Past Surgical History:  Procedure Laterality Date  . broken fingers    . HERNIA REPAIR         Home Medications    Prior to Admission medications   Medication Sig Start Date End Date Taking? Authorizing Provider  ALPRAZolam Prudy Feeler) 0.5 MG tablet Take 1 tablet (0.5 mg total) by mouth 2 (two) times daily as needed for anxiety. 09/29/17   Benjiman Core, MD  amLODipine (NORVASC) 5 MG tablet Take 5 mg by mouth daily. 06/21/17   [provider]  ARIPiprazole (ABILIFY) 5 MG tablet Take 1 tablet (5 mg total) by mouth daily. Patient not taking: Reported on 10/02/2017 09/06/17   Sharlene Dory, DO  atorvastatin (LIPITOR) 80 MG tablet Take 1 tablet (80 mg total) by mouth daily. 06/26/17   Sharlene Dory, DO  dicyclomine (BENTYL) 10 MG capsule Take 1 capsule (10 mg total) by mouth 4 (four) times daily -  before meals and at bedtime. 10/02/17   Sharlene Dory, DO  FLUoxetine (PROZAC) 10 MG tablet Take 1 tablet (10 mg total) by mouth daily. 06/26/17   Sharlene Dory, DO  FLUoxetine (PROZAC) 20 MG tablet Take 1 tablet (20 mg total) by mouth daily. 06/26/17   Sharlene Dory, DO  Ginkgo Biloba Complex 440 MG CAPS Take 1 capsule by mouth daily.    [provider]  losartan-hydrochlorothiazide (HYZAAR) 100-25 MG tablet Take 1 tablet by mouth daily. 06/26/17   Sharlene Dory, DO  meclizine (ANTIVERT) 25 MG tablet Take 1 tablet (25 mg total) by mouth 3 (three) times daily as needed for dizziness. 09/06/17   Sharlene Dory, DO  metoprolol succinate (TOPROL-XL) 50 MG 24 hr tablet Take 1 tablet (50 mg total) by mouth daily. 06/26/17   Sharlene Dory, DO  Multiple Vitamin (MULTIVITAMIN) tablet Take 1 tablet by mouth daily.    [provider]  NEEDLE, DISP, 22 G 22G X 1" MISC Use to inject testosterone intramuscularly every 2 weeks. 09/06/17    Sharlene Dory, DO  Omega-3 1000 MG CAPS Take by mouth. Take 4 g by mouth daily.    [provider]  ondansetron (ZOFRAN) 4 MG tablet Take 1 tablet (4 mg total) by mouth every 8 (eight) hours as needed for nausea or vomiting. 09/25/17   Tegeler, Canary Brim, MD  pantoprazole (PROTONIX) 40 MG tablet Take 1 tablet (40 mg total) by mouth daily. 06/26/17   Sharlene Dory, DO  potassium chloride SA (K-DUR,KLOR-CON) 20 MEQ tablet Take 1 tablet (20 mEq total) by mouth 2 (two) times daily. 10/18/17   Keyshla Tunison, Mayer Masker, MD  testosterone cypionate (DEPOTESTOSTERONE CYPIONATE) 200 MG/ML injection Inject 1 mL (200 mg total) into the muscle every 14 (fourteen) days. 06/26/17   Sharlene Dory, DO    Family History Family History  Problem Relation Age of Onset  . Diabetes Mother   . Hyperlipidemia Mother   . Hypertension Mother   . Heart disease Mother   . Cancer Father   . Hyperlipidemia Father   . Hypertension Father   . Other Father        kidney removal  . Heart disease Father   . Kidney disease Father   . Cancer Sister   . Hypertension Sister   . Colon cancer Neg Hx   . Esophageal cancer Neg Hx   . Rectal cancer Neg Hx   . Stomach cancer Neg Hx     Social History Social History   Tobacco Use  . Smoking status: Former Smoker    Last attempt to quit: 09/19/1996    Years since quitting: 21.0  . Smokeless tobacco: Never Used  Substance Use Topics  . Alcohol use: Yes    Comment: rarely, last use yesterday  . Drug use: No     Allergies   Patient has no known allergies.   Review of Systems Review of Systems  Unable to perform ROS: Acuity of condition     Physical Exam Updated Vital Signs BP 139/88 (BP Location: Left Arm)   Pulse 91   Temp 97.7 F (36.5 C) (Oral)   Resp (!) 21   Ht 6' (1.829 m)   Wt 122.5 kg (270 lb)   SpO2 99%   BMI 36.62 kg/m   Physical Exam  Constitutional: He is oriented to person, place, and time.  Agitated and  intermittently yelling  HENT:  Head: Normocephalic and atraumatic.  Neck: Neck supple.  Cardiovascular: Normal rate, regular rhythm and normal heart sounds.  No murmur heard. Pulmonary/Chest: Effort normal and breath sounds normal. No respiratory distress. He has no wheezes.  Abdominal: Soft. Bowel sounds are normal. There is tenderness in the epigastric area. There is no rebound and no guarding.  Musculoskeletal: He exhibits no edema.  Lymphadenopathy:    He has no cervical adenopathy.  Neurological: He is alert  and oriented to person, place, and time.  Skin: Skin is warm and dry.  Psychiatric: He has a normal mood and affect.  Nursing note and vitals reviewed.    ED Treatments / Results  Labs (all labs ordered are listed, but only abnormal results are displayed) Labs Reviewed  CBC WITH DIFFERENTIAL/PLATELET - Abnormal; Notable for the following components:      Result Value   HCT 37.8 (*)    All other components within normal limits  COMPREHENSIVE METABOLIC PANEL - Abnormal; Notable for the following components:   Potassium 2.8 (*)    Chloride 99 (*)    CO2 21 (*)    Glucose, Bld 153 (*)    Calcium 8.6 (*)    AST 53 (*)    Anion gap 17 (*)    All other components within normal limits  LIPASE, BLOOD  TROPONIN I  ETHANOL    EKG  EKG Interpretation  Date/Time:  Tuesday October 17 2017 23:42:31 EST Ventricular Rate:  111 PR Interval:  140 QRS Duration: 82 QT Interval:  348 QTC Calculation: 473 R Axis:   40 Text Interpretation:  Sinus tachycardia Nonspecific ST abnormality Abnormal ECG Confirmed by Ross Marcus ([id number]) on 10/18/2017 2:20:10 AM       Radiology Dg Abdomen Acute W/chest  Result Date: 10/18/2017 CLINICAL DATA:  47 y/o  M; epigastric pain. EXAM: DG ABDOMEN ACUTE W/ 1V CHEST COMPARISON:  09/25/2017 chest radiograph. 02/06/2017 CT abdomen and pelvis. FINDINGS: There is no evidence of dilated bowel loops or free intraperitoneal air. Hernia repair  mesh noted. No radiopaque calculi or other significant radiographic abnormality is seen. Heart size and mediastinal contours are within normal limits. Both lungs are clear. IMPRESSION: Negative abdominal radiographs.  No acute cardiopulmonary disease. Electronically Signed   By: Mitzi Hansen M.D.   On: 10/18/2017 02:38    Procedures Procedures (including critical care time)  Medications Ordered in ED Medications  haloperidol lactate (HALDOL) injection 5 mg (5 mg Intramuscular Given 10/18/17 0035)  diphenhydrAMINE (BENADRYL) injection 50 mg (50 mg Intravenous Given 10/18/17 0052)  LORazepam (ATIVAN) injection 2 mg (2 mg Intramuscular Given 10/18/17 0052)  gi cocktail (Maalox,Lidocaine,Donnatal) (30 mLs Oral Given 10/18/17 0108)  potassium chloride 10 mEq in 100 mL IVPB (0 mEq Intravenous Stopped 10/18/17 0358)  potassium chloride SA (K-DUR,KLOR-CON) CR tablet 40 mEq (40 mEq Oral Given 10/18/17 0300)     Initial Impression / Assessment and Plan / ED Course  I have reviewed the triage vital signs and the nursing notes.  Pertinent labs & imaging results that were available during my care of the patient were reviewed by me and considered in my medical decision making (see chart for details).  Clinical Course as of Oct 18 416  Wed Oct 18, 2017  6045 Patient calm and apologetic.  Reports that "this is the worst 1 of head yet."  Does report that he is seeing a psychologist and does take Xanax and an antidepressant.  His physical pain is much improved.  Workup is negative.  Does have mild hypokalemia.  This is being replaced.  We will allowed to rest while potassium is being replaced.  Recommend continued outpatient workup.  Follow-up with psychologist for reevaluation of medications.  [CH]    Clinical Course User Index [CH] Ellina Sivertsen, Mayer Masker, MD    Patient presents reporting abdominal pain.  He is very difficult to initially examine and obtain a history from secondary to agitation and  anxiety.  He appears to  be having an acute panic attack.  Patient required Haldol, Benadryl, and Ativan to calm down.  Lab work was then able to be obtained.  Potassium noted to be low at 2.8.  This was replaced.  Otherwise his lab work including liver function tests and lipase are reassuring.  Troponin negative.  EKG without evidence of acute ischemia.  Patient's vital signs have normalized after medications.  See clinical course above.  My suspicion is that his somatic complaints are likely related to his acute anxiety as this appeared to be the most profound element of his presentation.  I have discussed with him that he needs to follow-up with his counselor/psychologist for adjustments in medications.  Patient stated understanding.  After history, exam, and medical workup I feel the patient has been appropriately medically screened and is safe for discharge home. Pertinent diagnoses were discussed with the patient. Patient was given return precautions.   Final Clinical Impressions(s) / ED Diagnoses   Final diagnoses:  Anxiety attack  Hypokalemia  Epigastric pain    ED Discharge Orders        Ordered    potassium chloride SA (K-DUR,KLOR-CON) 20 MEQ tablet  2 times daily     10/18/17 0417       Marni Franzoni, Mayer Maskerourtney F, MD 10/18/17 [phone]

## 2017-10-19 ENCOUNTER — Ambulatory Visit: Payer: Self-pay | Admitting: Family Medicine

## 2017-10-20 ENCOUNTER — Ambulatory Visit: Payer: 59 | Admitting: Family Medicine

## 2017-10-25 ENCOUNTER — Ambulatory Visit: Payer: 59 | Admitting: Physical Therapy

## 2017-10-25 ENCOUNTER — Encounter: Payer: Self-pay | Admitting: Physical Therapy

## 2017-10-25 ENCOUNTER — Other Ambulatory Visit: Payer: Self-pay

## 2017-10-25 NOTE — Therapy (Signed)
[name] [address]  [address] [place], [place], [address] Phone: [phone]   Fax:  [phone]  Physical Therapy Evaluation  Patient Details  Name: Aaron [name] MRN: [id number] Date of Birth: May 12, 1971 Referring Provider: Radene Gunning, DO   Encounter Date: 10/25/2017  PT End of Session - 10/25/17 1142    Visit Number  1    PT Start Time  1142    PT Stop Time  1228    PT Time Calculation (min)  46 min    Activity Tolerance  Other (comment) Limited tolerance for assessment    Behavior During Therapy  Anxious       Past Medical History:  Diagnosis Date  . Anxiety   . Depression   . GERD (gastroesophageal reflux disease)   . Heart murmur   . History of chicken pox   . Hyperlipidemia   . Hypertension   . PTSD (post-traumatic stress disorder)   . Sleep apnea   . Tinnitus of both ears     Past Surgical History:  Procedure Laterality Date  . broken fingers    . HERNIA REPAIR      There were no vitals filed for this visit.   Subjective Assessment - 10/25/17 1146    Subjective  Pt reports vertigo started years ago, sporadic in nature with sudden onset without known triggers. Pt describes sensation of falling with onset of symptoms.    Currently in Pain?  No/denies         [name] PT Assessment - 10/25/17 1142      Assessment   Medical Diagnosis  Vertigo    Referring Provider  Aaron Gunning, DO    Onset Date/Surgical Date  -- many years, with exacerbation over past few months    Next MD Visit  TBD      Precautions   Precautions  Fall      Balance Screen   Has the patient fallen in the past 6 months  Yes    How many times?  uncertain     Has the patient had a decrease in activity level because of a fear of falling?   Yes esp with elevated heights    Is the patient reluctant to leave their home because of a fear of falling?   No      Home Environment   Living Environment  Private  residence    Living Arrangements  Alone    Type of Home  House    Home Access  Stairs to enter    Entrance Stairs-Number of Steps  1    Home Layout  Two level;Laundry or work area in basement      Prior Function   Level of Independence  Independent    Vocation  -- on leave since June 2017 d/t mutiple issues    Vocation Requirements  fire captain    Leisure  nothing due to deep depression         Vestibular Assessment - 10/25/17 1142      Vestibular Assessment   General Observation  Emotionally liable      Symptom Behavior   Type of Dizziness  Imbalance "falling"    Frequency of Dizziness  daily, multiple times per day    Duration of Dizziness  long duration    Aggravating Factors  Spontaneous onset;Forward bending;Supine to sit;Activity in general climbing ladders    Relieving Factors  Medication;Lying supine;Closing eyes;Rest  Occulomotor Exam   Occulomotor Alignment  Abnormal    Head shaking Horizontal  -- unable to attempt - states it causes nausea & migraine    Head Shaking Vertical  -- unable to attempt - states it causes nausea & migraine    Smooth Pursuits  -- no tracking to L    Saccades  Poor trajectory;Slow resists head movement      Vestibulo-Occular Reflex   VOR 1 Head Only (x 1 viewing)  slow with limited tracking      Auditory   Comments  reports tinnitus in B ears, L>R      Positional Testing   Dix-Hallpike  Dix-Hallpike Right;Dix-Hallpike Left      Dix-Hallpike Right   Dix-Hallpike Right Duration  15-20 sec    Dix-Hallpike Right Symptoms  Other (comment) "throbbing" in head + "spinning"      Dix-Hallpike Left   Dix-Hallpike Left Duration  15-20 sec    Dix-Hallpike Left Symptoms  Other (comment) "throbbing" in head + "spinning"      Cognition   Cognition Orientation Level  Oriented x 4    Cognition Comment  reports memory deficits      Positional Sensitivities   Sit to Supine  Mild dizziness    Supine to Left Side  Moderate dizziness     Supine to Right Side  Mild dizziness    Right Knee to Sitting  Moderate dizziness    Left Knee to Sitting  Moderate dizziness    Head Turning x 5  No dizziness but reports has caused nausea/vomiting before    Rolling Right  Mild dizziness    Rolling Left  Moderate dizziness         Objective measurements completed on examination: See above findings.                           Plan - 10/25/17 1230    Clinical Impression Statement  Aaron [name] is a 47 y/o male who presents to OP PT for evaluation of vertigo. Pt reports long h/o sporadic episodes of dizziness w/o predictable trigger, with pt describing primary symptom as feeling of imbalance where he feels like he is falling through the floor. Pt states in the past he has been able to manage these episodes with PRN use of meclizine, however recently the medication has not been as effective. Assessment very limited today due to pt's emotional liability (pt at one point crying profusely) with pt very easily distracted and difficult to redirect - pt suffering from PTSD, anxiety and depression as well as profound grief at the loss of his wife to suicide on their anniversary in Nov 2018. Given complexity of pt's symptoms and presentation, feel pt would be better served with assessment by a PT who specializes in vestibular rehab, therefore will forward referral to Neuro Rehab and follow up with new PT.    History and Personal Factors relevant to plan of care:  PTSD, anxierty/depression, chronic tinnitus with hearing loss L ear, ongoing gastrointestinal issues with work-up pending    Clinical Presentation  Unstable    Clinical Decision Making  High    PT Treatment/Interventions  Vestibular;Neuromuscular re-education;Visual/perceptual remediation/compensation;Canalith Repostioning;Balance training;Therapeutic activities;Functional mobility training;Patient/family education;ADLs/Self Care Home Management    PT Next Visit Plan  Recommend  further assessment by vestibular specialist - refer to neuro rehab    Consulted and Agree with Plan of Care  Patient       Patient will benefit  from skilled therapeutic intervention in order to improve the following deficits and impairments:  Decreased balance, Impaired vision/preception, Decreased safety awareness  Visit Diagnosis: Dizziness and giddiness     Problem List Patient Active Problem List   Diagnosis Date Noted  . Hearing loss of left ear 10/02/2017  . Sleep apnea 01/09/2012  . Hypogonadism male 01/02/2012  . History of blurred vision 12/30/2011  . Dizziness 12/21/2011  . Obesity (BMI 30-39.9) 11/23/2011  . Somnolence 11/23/2011  . Nonspecific abnormal electrocardiogram (ECG) (EKG) 11/23/2011  . Umbilical hernia 11/05/2010  . ANXIETY DEPRESSION 03/10/2010  . TMJ PAIN 01/12/2009  . PALPITATIONS 12/01/2008  . TINNITUS NOS 06/13/2007  . HYPERTENSION, BENIGN ESSENTIAL 06/13/2007    Marry GuanJoAnne M Kemisha Bonnette, PT, MPT 10/25/2017, 1:09 PM  [facility] [address]  [address] [place], [place], [address] Phone: [phone]   Fax:  [phone]  Name: Aaron [name] MRN: [id number] Date of Birth: 05/31/1971

## 2017-10-26 ENCOUNTER — Other Ambulatory Visit: Payer: Self-pay

## 2017-10-26 ENCOUNTER — Ambulatory Visit (AMBULATORY_SURGERY_CENTER): Payer: 59 | Admitting: Gastroenterology

## 2017-10-26 ENCOUNTER — Encounter: Payer: Self-pay | Admitting: Gastroenterology

## 2017-10-26 VITALS — BP 118/73 | HR 89 | Temp 97.8°F | Resp 12 | Ht 72.0 in | Wt 295.0 lb

## 2017-10-26 DIAGNOSIS — K319 Disease of stomach and duodenum, unspecified: Secondary | ICD-10-CM

## 2017-10-26 DIAGNOSIS — R1084 Generalized abdominal pain: Secondary | ICD-10-CM

## 2017-10-26 DIAGNOSIS — R1115 Cyclical vomiting syndrome unrelated to migraine: Secondary | ICD-10-CM

## 2017-10-26 DIAGNOSIS — K529 Noninfective gastroenteritis and colitis, unspecified: Secondary | ICD-10-CM

## 2017-10-26 DIAGNOSIS — G43A0 Cyclical vomiting, not intractable: Secondary | ICD-10-CM

## 2017-10-26 MED ORDER — SODIUM CHLORIDE 0.9 % IV SOLN: 500.0000 mL | Freq: Once | INTRAVENOUS | Status: DC

## 2017-10-26 NOTE — Progress Notes (Signed)
Report given to PACU, vss 

## 2017-10-26 NOTE — Progress Notes (Signed)
Called to room to assist during endoscopic procedure.  Patient ID and intended procedure confirmed with present staff. Received instructions for my participation in the procedure from the performing physician.  

## 2017-10-26 NOTE — Progress Notes (Signed)
Patient has severe PTSD, and he is very upset.  Very tense, and breathing very quickly.  Assured patient that he is ok, and that we would take good care of him.  His girlfriend Karolee StampsJanelle is with him.  He has not passed gas, and is grimacing and restless.  Girlfriend states that his PTSD doesn't help him to relax.  Got patient up on all fours, and he passed gas.    Went to bathroom and passed a lot of gas.  Patient was smiling when he left.  He calmed down a lot.

## 2017-10-26 NOTE — Op Note (Signed)
[facility] Patient Name: Aaron [name] Procedure Date: 10/26/2017 7:59 AM MRN: [id number] Endoscopist: Sherilyn Cooter L. Myrtie Neither , MD Age: 47 Referring MD:  Date of Birth: [birth year] Gender: Male Account #: [id number] Procedure:                Upper GI endoscopy Indications:              Generalized abdominal pain, Nausea with vomiting Medicines:                Monitored Anesthesia Care Procedure:                Pre-Anesthesia Assessment:                           - Prior to the procedure, a History and Physical                            was performed, and patient medications and                            allergies were reviewed. The patient's tolerance of                            previous anesthesia was also reviewed. The risks                            and benefits of the procedure and the sedation                            options and risks were discussed with the patient.                            All questions were answered, and informed consent                            was obtained. Prior Anticoagulants: The patient has                            taken no previous anticoagulant or antiplatelet                            agents. ASA Grade Assessment: II - A patient with                            mild systemic disease. After reviewing the risks                            and benefits, the patient was deemed in                            satisfactory condition to undergo the procedure.                           After obtaining informed consent, the endoscope was  passed under direct vision. Throughout the                            procedure, the patient's blood pressure, pulse, and                            oxygen saturations were monitored continuously. The                            Model GIF-[id number](SN#[id number]) scope was introduced                            through the mouth, and advanced to the second part                            of  duodenum. The upper GI endoscopy was                            accomplished without difficulty. The patient                            tolerated the procedure well. Scope In: Scope Out: Findings:                 The larynx was normal.                           The esophagus was normal.                           Striped erythematous mucosa was found in the                            gastric antrum. Biopsies were taken with a cold                            forceps for histology. (Sidney protocol).                           The cardia and gastric fundus were normal on                            retroflexion.                           The examined duodenum was normal. Complications:            No immediate complications. Estimated Blood Loss:     Estimated blood loss was minimal. Impression:               - Normal larynx.                           - Normal esophagus.                           - Erythematous mucosa in the antrum. Biopsied.                           -  Normal examined duodenum. Recommendation:           - Patient has a contact number available for                            emergencies. The signs and symptoms of potential                            delayed complications were discussed with the                            patient. Return to normal activities tomorrow.                            Written discharge instructions were provided to the                            patient.                           - Resume previous diet.                           - Continue present medications.                           - Await pathology results.                           - See the other procedure note for documentation of                            additional recommendations. Karlei Waldo L. Myrtie Neither, MD 10/26/2017 8:32:52 AM This report has been signed electronically.

## 2017-10-26 NOTE — Patient Instructions (Signed)
YOU HAD AN ENDOSCOPIC PROCEDURE TODAY AT THE [facility]:   Refer to the procedure report that was given to you for any specific questions about what was found during the examination.  If the procedure report does not answer your questions, please call your gastroenterologist to clarify.  If you requested that your care partner not be given the details of your procedure findings, then the procedure report has been included in a sealed envelope for you to review at your convenience later.  YOU SHOULD EXPECT: Some feelings of bloating in the abdomen. Passage of more gas than usual.  Walking can help get rid of the air that was put into your GI tract during the procedure and reduce the bloating. If you had a lower endoscopy (such as a colonoscopy or flexible sigmoidoscopy) you may notice spotting of blood in your stool or on the toilet paper. If you underwent a bowel prep for your procedure, you may not have a normal bowel movement for a few days.  Please Note:  You might notice some irritation and congestion in your nose or some drainage.  This is from the oxygen used during your procedure.  There is no need for concern and it should clear up in a day or so.  SYMPTOMS TO REPORT IMMEDIATELY:   Following lower endoscopy (colonoscopy or flexible sigmoidoscopy):  Excessive amounts of blood in the stool  Significant tenderness or worsening of abdominal pains  Swelling of the abdomen that is new, acute  Fever of 100F or higher   Following upper endoscopy (EGD)  Vomiting of blood or coffee ground material  New chest pain or pain under the shoulder blades  Painful or persistently difficult swallowing  New shortness of breath  Fever of 100F or higher  Black, tarry-looking stools  For urgent or emergent issues, a gastroenterologist can be reached at any hour by calling [phone].   DIET:  We do recommend a small meal at first, but then you may proceed to your regular diet.  Drink  plenty of fluids but you should avoid alcoholic beverages for 24 hours.  ACTIVITY:  You should plan to take it easy for the rest of today and you should NOT DRIVE or use heavy machinery until tomorrow (because of the sedation medicines used during the test).    FOLLOW UP: Our staff will call the number listed on your records the next business day following your procedure to check on you and address any questions or concerns that you may have regarding the information given to you following your procedure. If we do not reach you, we will leave a message.  However, if you are feeling well and you are not experiencing any problems, there is no need to return our call.  We will assume that you have returned to your regular daily activities without incident.  If any biopsies were taken you will be contacted by phone or by letter within the next 1-3 weeks.  Please call us at [phone] if you have not heard about the biopsies in 3 weeks.    SIGNATURES/CONFIDENTIALITY: You and/or your care partner have signed paperwork which will be entered into your electronic medical record.  These signatures attest to the fact that that the information above on your After Visit Summary has been reviewed and is understood.  Full responsibility of the confidentiality of this discharge information lies with you and/or your care-partner. 

## 2017-10-26 NOTE — Op Note (Signed)
[facility] Patient Name: Aaron [name] Procedure Date: 10/26/2017 7:59 AM MRN: [id number] Endoscopist: Sherilyn Cooter L. Myrtie Neither , MD Age: 47 Referring MD:  Date of Birth: 07/27/1971 Gender: Male Account #: [id number] Procedure:                Colonoscopy Indications:              Generalized abdominal pain Medicines:                Monitored Anesthesia Care Procedure:                Pre-Anesthesia Assessment:                           - Prior to the procedure, a History and Physical                            was performed, and patient medications and                            allergies were reviewed. The patient's tolerance of                            previous anesthesia was also reviewed. The risks                            and benefits of the procedure and the sedation                            options and risks were discussed with the patient.                            All questions were answered, and informed consent                            was obtained. Prior Anticoagulants: The patient has                            taken no previous anticoagulant or antiplatelet                            agents. ASA Grade Assessment: II - A patient with                            mild systemic disease. After reviewing the risks                            and benefits, the patient was deemed in                            satisfactory condition to undergo the procedure.                           After obtaining informed consent, the colonoscope  was passed under direct vision. Throughout the                            procedure, the patient's blood pressure, pulse, and                            oxygen saturations were monitored continuously. The                            Colonoscope was introduced through the anus and                            advanced to the the cecum, identified by the                            ileocecal valve. The colonoscopy was  performed                            without difficulty. The patient tolerated the                            procedure well. The quality of the bowel                            preparation was poor with a large amount of                            retained prep liquid that could not be cleared                            because of retained fibrous food debris. The                            ileocecal valve and the rectum cecal strap were                            photographed. The bowel preparation used was SUPREP. Scope In: 8:17:07 AM Scope Out: 8:28:47 AM Scope Withdrawal Time: 0 hours 8 minutes 17 seconds  Total Procedure Duration: 0 hours 11 minutes 40 seconds  Findings:                 The perianal and digital rectal examinations were                            normal.                           Retroflexion in the rectum was not performed due to                            large amount of retained liquid/debris.                           The entire examined colon appeared normal. Complications:  No immediate complications. Estimated Blood Loss:     Estimated blood loss: none. Impression:               - Preparation of the colon was poor.                           - The entire examined colon is normal.                           - No specimens collected.                           This patient appears to have functional abdominal                            pain. Recommendation:           - Patient has a contact number available for                            emergencies. The signs and symptoms of potential                            delayed complications were discussed with the                            patient. Return to normal activities tomorrow.                            Written discharge instructions were provided to the                            patient.                           - Resume previous diet.                           - Continue present medications.                            - Repeat colonoscopy in 5 years for screening                            purposes.                           - Return to primary care physician. Akiba Melfi L. Myrtie Neitheranis, MD 10/26/2017 8:40:51 AM This report has been signed electronically.

## 2017-10-27 ENCOUNTER — Telehealth: Payer: Self-pay

## 2017-10-27 NOTE — Telephone Encounter (Signed)
  Follow up Call-  Call back number 10/26/2017  Post procedure Call Back phone  # [phone]  Permission to leave phone message Yes  Some recent data might be hidden     Patient questions:  Do you have a fever, pain , or abdominal swelling? No. Pain Score  0 *  Have you tolerated food without any problems? Yes.    Have you been able to return to your normal activities? Yes.    Do you have any questions about your discharge instructions: Diet   No. Medications  No. Follow up visit  Yes.    Do you have questions or concerns about your Care? Yes.  Pt had concerns and questions about some chronic symptoms.  He described it as feeling his intestines getting hard and noisy.  When this happens he pushes on the area.  He mentioned issues with anxiety and this aggravating symptoms.  He said he's been hospitalized 10 times.  He plans on calling Dr Myrtie Neitheranis in the future if these symptoms/concerns continue.  Actions: * If pain score is 4 or above: No action needed, pain <4.

## 2017-10-31 ENCOUNTER — Encounter: Payer: Self-pay | Admitting: Gastroenterology

## 2017-11-08 ENCOUNTER — Other Ambulatory Visit: Payer: Self-pay | Admitting: Family Medicine

## 2017-11-08 DIAGNOSIS — F341 Dysthymic disorder: Secondary | ICD-10-CM

## 2017-11-08 MED FILL — BD NEEDLES 22GX1": 22G X 1" | 27 days supply | Qty: 2 | Fill #1

## 2017-11-08 MED FILL — MECLIZINE 25 MG TABLET: 25 | 10 days supply | Qty: 30 | Fill #1

## 2017-11-08 MED FILL — AMLODIPINE BESYLATE 5 MG TA: 5 | 30 days supply | Qty: 30 | Fill #4

## 2017-11-08 MED FILL — BD NEEDLES 22GX1: 22G X 1" | 27 days supply | Qty: 2 | Fill #1

## 2017-11-08 MED FILL — LOSARTAN-HCTZ 100-25 MG TAB: 100-25 | 30 days supply | Qty: 30 | Fill #4

## 2017-11-08 MED FILL — TESTOSTERONE CYP 200 MG/ML: 200 | 28 days supply | Qty: 2 | Fill #4

## 2017-11-08 MED FILL — ATORVASTATIN 80 MG TABLET: 80 | 30 days supply | Qty: 30 | Fill #4

## 2017-11-08 MED FILL — PANTOPRAZOLE SOD DR 40 MG T: 40 | 30 days supply | Qty: 30 | Fill #4

## 2017-11-08 MED FILL — METOPROLOL SUCCINATE ER 50: 50 | 30 days supply | Qty: 30 | Fill #4

## 2017-11-08 MED FILL — CIALIS 20 MG TABLET: 20 | 31 days supply | Qty: 3 | Fill #2

## 2017-11-08 MED FILL — FLUoxetine HCL 20 MG CAPS: 20 | 30 days supply | Qty: 30 | Fill #3

## 2017-11-08 MED FILL — FLUoxetine HCL 10 MG CAPS: 10 | 30 days supply | Qty: 30 | Fill #3

## 2017-11-08 MED FILL — ARIPiprazole 5 MG TABS: 5 | 30 days supply | Qty: 30 | Fill #1

## 2017-11-08 NOTE — Telephone Encounter (Signed)
Last refill on 09/29/2017  #8 no refills Last office visit on 10/02/2017 02/15/2017 contract

## 2017-11-09 ENCOUNTER — Encounter: Payer: 59 | Admitting: Physical Therapy

## 2017-11-09 ENCOUNTER — Ambulatory Visit: Payer: 59 | Admitting: Physical Therapy

## 2017-11-09 MED FILL — ALPRAZolam 0.5 MG TABS: 0.5 | 15 days supply | Qty: 30 | Fill #0

## 2017-11-09 NOTE — Telephone Encounter (Signed)
Refill given for now, I would like him scheduled for appt. Please also see where he stands with getting in with the psychiatry team. TY.

## 2017-11-09 NOTE — Telephone Encounter (Signed)
Called cell/home number left message to call back. 

## 2017-11-10 NOTE — Telephone Encounter (Signed)
Spoke to the patient informed of PCP instructions. He has not called to reschedule--states he will followup. Will schedule appointment with PCP asap.

## 2017-11-13 ENCOUNTER — Ambulatory Visit: Payer: 59 | Admitting: Physical Therapy

## 2017-11-17 ENCOUNTER — Ambulatory Visit: Payer: 59 | Attending: Family Medicine | Admitting: Rehabilitative and Restorative Service Providers"

## 2017-11-17 ENCOUNTER — Encounter: Payer: Self-pay | Admitting: Rehabilitative and Restorative Service Providers"

## 2017-11-17 VITALS — BP 131/94 | HR 101

## 2017-11-17 DIAGNOSIS — R2681 Unsteadiness on feet: Secondary | ICD-10-CM | POA: Diagnosis present

## 2017-11-17 DIAGNOSIS — R42 Dizziness and giddiness: Secondary | ICD-10-CM

## 2017-11-17 DIAGNOSIS — R2689 Other abnormalities of gait and mobility: Secondary | ICD-10-CM | POA: Diagnosis present
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
## 2017-11-17 NOTE — Patient Instructions (Signed)
Head Motion: Side to Side    Sitting, tilt head down 15-30, slowly move head to right with eyes open. Hold position until symptoms subside. Then, move head slowly to opposite side. Hold position until symptoms subside. Repeat __5__ times per session. Do _3___ sessions per day.  Copyright  VHI. All rights reserved.    Try to increase speed as your are able to tolerate.

## 2017-11-23 ENCOUNTER — Ambulatory Visit: Payer: 59 | Admitting: Rehabilitative and Restorative Service Providers"

## 2017-11-27 ENCOUNTER — Ambulatory Visit: Payer: Self-pay | Admitting: Family Medicine

## 2017-11-29 ENCOUNTER — Ambulatory Visit: Payer: 59 | Admitting: Family Medicine

## 2017-11-29 ENCOUNTER — Encounter: Payer: Self-pay | Admitting: Family Medicine

## 2017-11-29 ENCOUNTER — Telehealth: Payer: Self-pay | Admitting: Family Medicine

## 2017-11-29 VITALS — BP 120/80 | HR 109 | Temp 97.6°F | Ht 73.0 in | Wt 266.4 lb

## 2017-11-29 DIAGNOSIS — R42 Dizziness and giddiness: Secondary | ICD-10-CM

## 2017-11-29 DIAGNOSIS — Z634 Disappearance and death of family member: Secondary | ICD-10-CM | POA: Diagnosis not present

## 2017-11-29 DIAGNOSIS — Z91199 Patient's noncompliance with other medical treatment and regimen due to unspecified reason: Secondary | ICD-10-CM

## 2017-11-29 DIAGNOSIS — Z9119 Patient's noncompliance with other medical treatment and regimen: Secondary | ICD-10-CM | POA: Diagnosis not present

## 2017-11-29 DIAGNOSIS — F4310 Post-traumatic stress disorder, unspecified: Secondary | ICD-10-CM | POA: Insufficient documentation

## 2017-11-29 DIAGNOSIS — Z0279 Encounter for issue of other medical certificate: Secondary | ICD-10-CM

## 2017-11-29 NOTE — Telephone Encounter (Signed)
Copied from CRM [phone]. Topic: Quick Communication - See Telephone Encounter >> Nov 29, 2017 11:18 AM Valentina LucksMatos, Jackelin wrote: CRM for notification. See Telephone encounter for:  11/29/17.   Pt dropped off document to be filled out by provider (FORM 7A Medical Report Disabiltiy Eligibility Review -8 pages with original copy and an extra new copy to be filled out) Pt would like document to be faxed to [phone] and to call him for him to pick up copy. Document given to Robin.

## 2017-11-29 NOTE — Patient Instructions (Addendum)
PLEASE call the psychiatry team today.  [facility] [address] [place], [place] [address] [phone]  [facility] [address]  [place], [place] [address] [phone]  [facility] [address] [place], [place] [address] [phone]  Call one of these offices sooner than later as it can take 2-3 months to get a new patient appointment.   Take the Abilify every day until I see you next.   Try to cut down on your alcohol use.

## 2017-11-29 NOTE — Progress Notes (Signed)
Chief Complaint  Patient presents with  . Follow-up    Subjective: Patient is a 46 y.o. male here for med follow up.  The patient suffers from PTSD/anxiety/depression.  His wife recently took her life in the fall 2018.  This is been particularly stressful for him.  If not for his son, the patient states he would have killed himself already.  He is otherwise not have any thoughts of harming himself or others.  He is currently on 30 mg of Prozac daily.  He was recommended on several occasions to seek out help from the psychiatry team.  He has not done this yet.  He has seen the counselor at our office with little relief.  He was also started on Abilify daily which he takes intermittently.  His alcohol use is down to around 6 drinks per night from 18.  He does not use Xanax when he drinks alcohol.  His vertigo is also worsening.  It had resolved for some time, however then he started to get it again.  He is seeing the vestibular rehab team.  He is on Antivert which is somewhat helpful.  The dizziness lasts for several seconds after he moves his head.  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  ROS: Psych: No homicidal ideation Neuro: +dizziness  Family History  Problem Relation Age of Onset  . Diabetes Mother   . Hyperlipidemia Mother   . Hypertension Mother   . Heart disease Mother   . Cancer Father   . Hyperlipidemia Father   . Hypertension Father   . Other Father        kidney removal  . Heart disease Father   . Kidney disease Father   . Cancer Sister   . Hypertension Sister   . Colon cancer Neg Hx   . Esophageal cancer Neg Hx   . Rectal cancer Neg Hx   . Stomach cancer Neg Hx    Past Medical History:  Diagnosis Date  . Anxiety   . Depression   . GERD (gastroesophageal reflux disease)   . Heart murmur   . History of chicken pox   . Hyperlipidemia   . Hypertension   . PTSD (post-traumatic stress disorder)   . Sleep apnea   . Tinnitus of  both ears    No Known Allergies  Current Outpatient Medications:  .  ALPRAZolam (XANAX) 0.5 MG tablet, TAKE 1 TABLET BY MOUTH TWICE DAILY AS NEEDED FOR ANXIETY, Disp: 30 tablet, Rfl: 0 .  amLODipine (NORVASC) 5 MG tablet, Take 5 mg by mouth daily., Disp: , Rfl:  .  ARIPiprazole (ABILIFY) 5 MG tablet, Take 1 tablet (5 mg total) by mouth daily., Disp: 30 tablet, Rfl: 2 .  atorvastatin (LIPITOR) 80 MG tablet, Take 1 tablet (80 mg total) by mouth daily., Disp: 90 tablet, Rfl: 1 .  dicyclomine (BENTYL) 10 MG capsule, Take 1 capsule (10 mg total) by mouth 4 (four) times daily -  before meals and at bedtime., Disp: 60 capsule, Rfl: 1 .  FLUoxetine (PROZAC) 10 MG tablet, Take 1 tablet (10 mg total) by mouth daily., Disp: 30 tablet, Rfl: 3 .  FLUoxetine (PROZAC) 20 MG tablet, Take 1 tablet (20 mg total) by mouth daily., Disp: 90 tablet, Rfl: 1 .  Ginkgo Biloba Complex 440 MG CAPS, Take 1 capsule by mouth daily., Disp: , Rfl:  .  losartan-hydrochlorothiazide (HYZAAR) 100-25 MG tablet, Take 1 tablet by mouth daily., Disp: 90 tablet,  Rfl: 1 .  meclizine (ANTIVERT) 25 MG tablet, Take 1 tablet (25 mg total) by mouth 3 (three) times daily as needed for dizziness., Disp: 30 tablet, Rfl: 1 .  metoprolol succinate (TOPROL-XL) 50 MG 24 hr tablet, Take 1 tablet (50 mg total) by mouth daily., Disp: 90 tablet, Rfl: 1 .  Multiple Vitamin (MULTIVITAMIN) tablet, Take 1 tablet by mouth daily., Disp: , Rfl:  .  NEEDLE, DISP, 22 G 22G X 1" MISC, Use to inject testosterone intramuscularly every 2 weeks., Disp: 25 each, Rfl: 0 .  Omega-3 1000 MG CAPS, Take by mouth. Take 4 g by mouth daily., Disp: , Rfl:  .  ondansetron (ZOFRAN) 4 MG tablet, Take 1 tablet (4 mg total) by mouth every 8 (eight) hours as needed for nausea or vomiting., Disp: 12 tablet, Rfl: 0 .  pantoprazole (PROTONIX) 40 MG tablet, Take 1 tablet (40 mg total) by mouth daily., Disp: 90 tablet, Rfl: 1 .  potassium chloride SA (K-DUR,KLOR-CON) 20 MEQ tablet, Take  1 tablet (20 mEq total) by mouth 2 (two) times daily., Disp: 10 tablet, Rfl: 0 .  testosterone cypionate (DEPOTESTOSTERONE CYPIONATE) 200 MG/ML injection, Inject 1 mL (200 mg total) into the muscle every 14 (fourteen) days., Disp: 10 mL, Rfl: 0  Objective: BP 120/80 (BP Location: Left Arm, Patient Position: Sitting, Cuff Size: Large)   Pulse (!) 109   Temp 97.6 F (36.4 C) (Oral)   Ht $RemoveBeforeD [id number]$6\' 1"  BMI 35.14 kg/m  General: Awake, appears stated age HEENT: MMM, EOMi Lungs: No accessory muscle use Psych: Age appropriate judgment and insight, normal affect and mood  Assessment and Plan: Vertigo - Plan: Ambulatory referral to ENT  Bereavement due to life event  PTSD (post-traumatic stress disorder)  Noncompliance  Orders as above. He is instructed to contact psychiatry yet again. Contact info provided. I want him to take Abilify daily. Follow-up in 1 week unless he can get into psychiatry sooner. I did fill out his disability forms again.  Per guidelines, because of his vertigo, he does qualify for permanent disability. I applauded him for cutting down his alcohol use from 18 drinks to 6 drinks per night.  I would like him to cut down even more.  He was again reminded to avoid using Xanax and alcohol at the same time. The patient voiced understanding and agreement to the plan.  Greater than 25 minutes were spent face to face with the patient with greater than 50% of this time spent counseling on vertigo, anxiety/depression, prognosis and the importance of psychiatry in his care; I also spent time coordinating care regarding his disability paperwork.    Jilda Rocheicholas [place] But[place]end[place], DO 11/29/17  12:28 PM

## 2017-11-29 NOTE — Progress Notes (Signed)
Pre visit review using our clinic review tool, if applicable. No additional management support is needed unless otherwise documented below in the visit note. 

## 2017-12-01 ENCOUNTER — Encounter: Payer: Self-pay | Admitting: Rehabilitative and Restorative Service Providers"

## 2017-12-07 ENCOUNTER — Encounter: Payer: Self-pay | Admitting: Rehabilitative and Restorative Service Providers"

## 2017-12-13 ENCOUNTER — Ambulatory Visit: Payer: 59 | Admitting: Rehabilitative and Restorative Service Providers"

## 2017-12-14 MED FILL — LOSARTAN-HCTZ 100-25 MG TAB: 100-25 | 30 days supply | Qty: 30 | Fill #5

## 2017-12-14 MED FILL — METOPROLOL SUCCINATE ER 50: 50 | 30 days supply | Qty: 30 | Fill #5

## 2017-12-14 MED FILL — ARIPiprazole 5 MG TABS: 5 | 30 days supply | Qty: 30 | Fill #2

## 2017-12-14 MED FILL — AMLODIPINE BESYLATE 5 MG TA: 5 | 30 days supply | Qty: 30 | Fill #5

## 2017-12-14 MED FILL — CIALIS 20 MG TABLET: 20 | 1 days supply | Qty: 1 | Fill #3

## 2017-12-14 MED FILL — ATORVASTATIN 80 MG TABLET: 80 | 30 days supply | Qty: 30 | Fill #5

## 2017-12-14 MED FILL — PANTOPRAZOLE SOD DR 40 MG T: 40 | 30 days supply | Qty: 30 | Fill #5

## 2017-12-14 MED FILL — FLUoxetine HCL 20 MG CAPS: 20 | 30 days supply | Qty: 30 | Fill #4

## 2017-12-15 ENCOUNTER — Telehealth: Payer: Self-pay | Admitting: Family Medicine

## 2017-12-15 ENCOUNTER — Other Ambulatory Visit: Payer: Self-pay | Admitting: Family Medicine

## 2017-12-15 MED ORDER — TESTOSTERONE CYPIONATE 200 MG/ML IM SOLN: 200.0000 mg | INTRAMUSCULAR | 0 refills | Status: DC

## 2017-12-15 NOTE — Telephone Encounter (Signed)
Also requesting cialis 20 mg

## 2017-12-15 NOTE — Telephone Encounter (Signed)
Schedule pt with me to discuss. TY.

## 2017-12-15 NOTE — Telephone Encounter (Signed)
Called left message to call back 

## 2017-12-15 NOTE — Telephone Encounter (Signed)
Copied from CRM [phone]. Topic: Quick Communication - See Telephone Encounter >> Dec 15, 2017  2:19 PM Valentina LucksMatos, Jackelin wrote: CRM for notification. See Telephone encounter for: 12/15/17.   Pt came in office stating is having issues still with sleeping and states that Xanax helps but not completely, (pt's friend mentioned that his PA recommends him that it would be good to take if possible tramodol with xanax) Pt would like to see if there is something like this for him to take so that he can be able to sleep. Please advise.

## 2017-12-18 ENCOUNTER — Ambulatory Visit: Payer: 59 | Admitting: Family Medicine

## 2017-12-18 DIAGNOSIS — Z0289 Encounter for other administrative examinations: Secondary | ICD-10-CM

## 2017-12-21 ENCOUNTER — Encounter: Payer: Self-pay | Admitting: Rehabilitative and Restorative Service Providers"

## 2017-12-27 ENCOUNTER — Ambulatory Visit: Payer: 59 | Attending: Family Medicine | Admitting: Rehabilitative and Restorative Service Providers"

## 2018-01-18 ENCOUNTER — Other Ambulatory Visit: Payer: Self-pay

## 2018-01-18 ENCOUNTER — Encounter (HOSPITAL_BASED_OUTPATIENT_CLINIC_OR_DEPARTMENT_OTHER): Payer: Self-pay | Admitting: *Deleted

## 2018-01-18 ENCOUNTER — Emergency Department (HOSPITAL_BASED_OUTPATIENT_CLINIC_OR_DEPARTMENT_OTHER)
Admission: EM | Admit: 2018-01-18 | Discharge: 2018-01-18 | Disposition: A | Discharge status: Home / Self Care | Payer: 59 | Attending: Emergency Medicine | Admitting: Emergency Medicine

## 2018-01-18 ENCOUNTER — Telehealth: Payer: Self-pay | Admitting: *Deleted

## 2018-01-18 ENCOUNTER — Other Ambulatory Visit: Payer: Self-pay | Admitting: Family Medicine

## 2018-01-18 DIAGNOSIS — K219 Gastro-esophageal reflux disease without esophagitis: Secondary | ICD-10-CM

## 2018-01-18 DIAGNOSIS — Z634 Disappearance and death of family member: Secondary | ICD-10-CM

## 2018-01-18 DIAGNOSIS — Z5321 Procedure and treatment not carried out due to patient leaving prior to being seen by health care provider: Secondary | ICD-10-CM | POA: Diagnosis not present

## 2018-01-18 DIAGNOSIS — R109 Unspecified abdominal pain: Secondary | ICD-10-CM | POA: Diagnosis not present

## 2018-01-18 MED FILL — AMLODIPINE BESYLATE 5 MG TA: 5 | 30 days supply | Qty: 30 | Fill #0

## 2018-01-18 MED FILL — ATORVASTATIN 80 MG TABLET: 80 | 30 days supply | Qty: 30 | Fill #0

## 2018-01-18 MED FILL — FLUoxetine HCL 20 MG CAPS: 20 | 30 days supply | Qty: 30 | Fill #5

## 2018-01-18 NOTE — ED Triage Notes (Signed)
Hx of severe anxiety. Here today with abdominal pain that started after eating lunch with hot sauce.

## 2018-01-18 NOTE — Telephone Encounter (Signed)
Patient came in to bring new [duplicate] Medical Report for Disability Eligibility Review, as he stated that there are changes & additions to be made. He reports he has already left the job, he just need to get the paperwork to file to receive retirement benefits. Patient was full of anxiety and remorse, as this his unfortunately been his daily strife for a long time, worsening last November when he lost his wife. We [me, Navi and some help from Shanea] spent approx one hour discussing life and the curve balls it throws you and how he has let go of his anger through finding his Faith again with God; I feel he is turning a major corner in his life for the better. He has [employer] and will call today to schedule appt with Psychiatrist as well, and we will see him tomorrow for an office visit per Dr. Claris Pong 05/02

## 2018-01-19 ENCOUNTER — Ambulatory Visit: Payer: 59 | Admitting: Family Medicine

## 2018-01-19 MED FILL — CIALIS 20 MG TABLET: 20 | 30 days supply | Qty: 3 | Fill #0

## 2018-01-19 MED FILL — LOSARTAN-HCTZ 100-25 MG TAB: 100-25 | 30 days supply | Qty: 30 | Fill #0

## 2018-01-19 MED FILL — ARIPiprazole 5 MG TABS: 5 | 30 days supply | Qty: 30 | Fill #0

## 2018-01-19 MED FILL — PANTOPRAZOLE SOD DR 40 MG T: 40 | 30 days supply | Qty: 90 | Fill #0

## 2018-01-22 MED FILL — ONDANSETRON ODT 4 MG TABLET: 4 | 4 days supply | Qty: 12 | Fill #0

## 2018-01-23 ENCOUNTER — Encounter: Payer: Self-pay | Admitting: Family Medicine

## 2018-01-24 ENCOUNTER — Ambulatory Visit: Payer: 59 | Admitting: Family Medicine

## 2018-01-24 ENCOUNTER — Encounter: Payer: Self-pay | Admitting: Family Medicine

## 2018-01-24 VITALS — BP 130/72 | HR 109 | Temp 97.6°F | Ht 72.0 in | Wt 263.1 lb

## 2018-01-24 DIAGNOSIS — F1010 Alcohol abuse, uncomplicated: Secondary | ICD-10-CM

## 2018-01-24 DIAGNOSIS — R740 Nonspecific elevation of levels of transaminase and lactic acid dehydrogenase [LDH]: Secondary | ICD-10-CM

## 2018-01-24 DIAGNOSIS — Z634 Disappearance and death of family member: Secondary | ICD-10-CM

## 2018-01-24 DIAGNOSIS — R42 Dizziness and giddiness: Secondary | ICD-10-CM | POA: Diagnosis not present

## 2018-01-24 DIAGNOSIS — R7401 Elevation of levels of liver transaminase levels: Secondary | ICD-10-CM

## 2018-01-24 MED ORDER — TESTOSTERONE CYPIONATE 200 MG/ML IM SOLN
200.0000 mg | INTRAMUSCULAR | 0 refills | Status: DC
Start: 2018-01-24 — End: 2018-01-24

## 2018-01-24 MED ORDER — TESTOSTERONE CYPIONATE 200 MG/ML IM SOLN
200.0000 mg | INTRAMUSCULAR | 0 refills | Status: DC
Start: 2018-01-24 — End: 2020-12-16

## 2018-01-24 MED ORDER — MECLIZINE HCL 25 MG PO TABS: 25.0000 mg | ORAL_TABLET | Freq: Three times a day (TID) | ORAL | 1 refills | Status: DC | PRN

## 2018-01-24 NOTE — Progress Notes (Signed)
Chief Complaint  Patient presents with  . Follow-up    paper work    Subjective: Patient is a 47 y.o. male here for f/u paperwork.  Pt has been dealing with PTSD, anxiety, vertigo, and bereavement secondary to his wife committing suicide.  He has been recommended to follow-up with psychiatry for many months now.  He has not yet done this.  He continues to drink daily.  He is currently drinking 12 beers per night.  This is improved from 18-20 drinks per night.  He had labs done in the emergency department that did show transaminitis.  I will recheck this today.  He is having some epigastric abdominal pain and has followed with a gastroenterology team without significant findings.  The patient will use Xanax sparingly.  A 30-day supplies last him 2-1/2 months.  That being said, as he continues to drink, he should probably be taken off of this. He is still having epigastric abdominal pain as he continues to drink.   ROS: Psych: As noted in HPI GI: +abd pain  Past Medical History:  Diagnosis Date  . Anxiety   . Depression   . GERD (gastroesophageal reflux disease)   . Heart murmur   . History of chicken pox   . Hyperlipidemia   . Hypertension   . PTSD (post-traumatic stress disorder)   . Sleep apnea   . Tinnitus of both ears    No Known Allergies   Objective: BP 130/72 (BP Location: Left Arm, Patient Position: Sitting, Cuff Size: Large)   Pulse (!) 109   Temp 97.6 F (36.4 C) (Oral)   Ht 6' (1.829 m)   Wt 263 lb 2 oz (119.4 kg)   SpO2 98%   BMI 35.69 kg/m  General: Awake, appears stated age Lungs: No accessory muscle use Abd: BS+, soft, mild epigastric ttp, ND, no masses or organomegaly  Psych: Age appropriate judgment and insight, normal affect and mood  Assessment and Plan: Bereavement due to life event  Vertigo - Plan: meclizine (ANTIVERT) 25 MG tablet  Alcohol abuse - Plan: Hepatic function panel  Transaminitis - Plan: Hepatic function panel  I had a very frank  discussion with the patient today.  I believe he is killing himself slowly with alcohol use and his failure to get in with the psychiatry team.  I told him it is imperative that he get in with the psychiatry team as what he needs is something that I cannot provide him.  I also think that if he stops drinking now, he may be able to prevent irreversible damage to his liver. His wife's cousin is living with him right now.  He has turned religion for social support.  I think this is a reasonable decision.  I would like him to contact psychiatry today, if not have his cousin do it. He needs to stop drinking.  I want him to start tapering down today.  We will check a liver function panel today.  If transaminitis is still present, we will check an ultrasound for signs of cirrhosis. Follow-up in 6 weeks to check on alcohol cessation status. The patient voiced understanding and agreement to the plan.  [name] [place], DO 01/24/18  4:03 PM

## 2018-01-24 NOTE — Progress Notes (Signed)
Pre visit review using our clinic review tool, if applicable. No additional management support is needed unless otherwise documented below in the visit note. 

## 2018-01-24 NOTE — Patient Instructions (Signed)
Call the psychiatry team today!! Have Michelle's cousin call if you don't feel like it.  [facility] [address] [place], [place] [address] [phone]  [facility] [address] [place], [place] [address] [phone]  [facility] [address] [place], [place] [address] [phone]  Call one of these offices sooner than later as it can take 2-3 months to get a new patient appointment.   Start tapering down on your drinking. I think this could be affecting your liver.

## 2018-01-25 ENCOUNTER — Other Ambulatory Visit: Payer: Self-pay | Admitting: Family Medicine

## 2018-01-25 DIAGNOSIS — R740 Nonspecific elevation of levels of transaminase and lactic acid dehydrogenase [LDH]: Principal | ICD-10-CM

## 2018-01-25 DIAGNOSIS — R7401 Elevation of levels of liver transaminase levels: Secondary | ICD-10-CM

## 2018-01-25 LAB — HEPATIC FUNCTION PANEL
ALK PHOS: 47 U/L (ref 39–117)
ALT: 87 U/L — ABNORMAL HIGH (ref 0–53)
AST: 38 U/L — ABNORMAL HIGH (ref 0–37)
Albumin: 4.6 g/dL (ref 3.5–5.2)
BILIRUBIN DIRECT: 0.1 mg/dL (ref 0.0–0.3)
BILIRUBIN TOTAL: 0.5 mg/dL (ref 0.2–1.2)
Total Protein: 8 g/dL (ref 6.0–8.3)

## 2018-02-23 ENCOUNTER — Encounter: Payer: Self-pay | Admitting: Family Medicine

## 2018-02-27 ENCOUNTER — Ambulatory Visit: Payer: 59 | Admitting: Family Medicine

## 2018-02-27 ENCOUNTER — Ambulatory Visit: Payer: Self-pay

## 2018-02-27 ENCOUNTER — Telehealth: Payer: Self-pay

## 2018-02-27 VITALS — BP 176/98 | HR 98 | Ht 72.0 in

## 2018-02-27 DIAGNOSIS — I10 Essential (primary) hypertension: Secondary | ICD-10-CM

## 2018-02-27 DIAGNOSIS — F4310 Post-traumatic stress disorder, unspecified: Secondary | ICD-10-CM
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
## 2018-02-27 NOTE — Progress Notes (Signed)
Patient will take BP medication as directed, has follow-up appointment with Dr. [name] on Thurs, 03/08/18, he was given a reminder card. Patient has been under great deal of stress in his life and he had stopped 'cold Malawiturkey' the px medications to help with his stress & anxiety, he will use only as needed. We will send his OV notes, along with demographics to [facility]; they cannot get him in until July with Psychiatrist, but he has the 24/7 counselor line that he will use as often as needed until his appointment/SLS 06/11

## 2018-02-27 NOTE — Telephone Encounter (Signed)
Not answering his phone and VM is full; Fleeta EmmerShanea will keep trying to reach him this afternoon/SLS 06/11

## 2018-02-27 NOTE — Telephone Encounter (Signed)
Thanks for the follow up and helping this patient

## 2018-02-27 NOTE — Patient Instructions (Addendum)
Patient will take BP medication as directed, has follow-up appointment with Dr. [name] on Thurs, 03/08/18, he was given a reminder card. Patient has been under great deal of stress in his life and he had stopped 'cold [place]urkey' the px medications to help with his stress & anxiety, he will use only as needed. We will send his OV notes, along with demographics to [facility]; they cannot get him in until July with Psychiatrist, but he has the 24/7 counselor line that he will use as often as needed until his appointment/SLS 06/11  Subjective:  Aaron [name] is a 47 y.o. male with hypertension. Current Outpatient Medications  Medication Sig Dispense Refill  . amLODipine (NORVASC) 5 MG tablet Take 5 mg by mouth daily.    . ARIPiprazole (ABILIFY) 5 MG tablet TAKE 1 TABLET (5 MG TOTAL) BY MOUTH DAILY. 30 tablet 2  . atorvastatin (LIPITOR) 80 MG tablet Take 1 tablet (80 mg total) by mouth daily. 90 tablet 1  . CIALIS 20 MG tablet TAKE 1 TABLET BY MOUTH DAILY AS NEEDED FOR ERECTILE DYSFUNCTION 10 tablet 0  . dicyclomine (BENTYL) 10 MG capsule Take 1 capsule (10 mg total) by mouth 4 (four) times daily -  before meals and at bedtime. 60 capsule 1  . FLUoxetine (PROZAC) 10 MG tablet Take 1 tablet (10 mg total) by mouth daily. 30 tablet 3  . FLUoxetine (PROZAC) 20 MG tablet Take 1 tablet (20 mg total) by mouth daily. 90 tablet 1  . Ginkgo Biloba Complex 440 MG CAPS Take 1 capsule by mouth daily.    Marland Kitchen. losartan-hydrochlorothiazide (HYZAAR) 100-25 MG tablet TAKE 1 TABLET BY MOUTH DAILY. 90 tablet 1  . meclizine (ANTIVERT) 25 MG tablet Take 1 tablet (25 mg total) by mouth 3 (three) times daily as needed for dizziness. 30 tablet 1  . metoprolol succinate (TOPROL-XL) 50 MG 24 hr tablet Take 1 tablet (50 mg total) by mouth daily. 90 tablet 1  . Multiple Vitamin (MULTIVITAMIN) tablet Take 1 tablet by mouth daily.    Marland Kitchen. NEEDLE, DISP, 22 G 22G X 1" MISC Use to inject testosterone intramuscularly  every 2 weeks. 25 each 0  . Omega-3 1000 MG CAPS Take by mouth. Take 4 g by mouth daily.    . pantoprazole (PROTONIX) 40 MG tablet TAKE 1 TABLET (40 MG TOTAL) BY MOUTH DAILY. 90 tablet 1  . potassium chloride SA (K-DUR,KLOR-CON) 20 MEQ tablet Take 1 tablet (20 mEq total) by mouth 2 (two) times daily. 10 tablet 0  . testosterone cypionate (DEPOTESTOSTERONE CYPIONATE) 200 MG/ML injection Inject 1 mL (200 mg total) into the muscle every 14 (fourteen) days. 10 mL 0   No current facility-administered medications for this visit.     Hypertension ROS:  Not taking medications as instructed consistently.  New concerns: Exacerbation of stress d/t grief response and feeling "abandoned".   Objective:  BP (!) 176/98 (BP Location: Left Arm, Patient Position: Sitting, Cuff Size: Large)   Pulse 98   Appearance alert, well appearing, and in mild to moderate distress [regarding today].  Assessment:   Hypertension asymptomatic.   Plan:  Follow Up office visit with Dr. [name] on Thursday, 03/08/18.
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
## 2018-02-27 NOTE — Telephone Encounter (Signed)
Patient came to office escorted by police and agreed to schedule a follow up with PMD and counselor. Was calm and reasoned as he interacted with staff

## 2018-02-27 NOTE — Telephone Encounter (Signed)
Spoke with patient. He sounds like he is doing well for the time being. Understands what to do if he becomes suicidal.
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
## 2018-02-28 ENCOUNTER — Other Ambulatory Visit: Payer: Self-pay | Admitting: Family Medicine

## 2018-02-28 DIAGNOSIS — Z634 Disappearance and death of family member: Secondary | ICD-10-CM

## 2018-02-28 DIAGNOSIS — F341 Dysthymic disorder: Secondary | ICD-10-CM

## 2018-02-28 DIAGNOSIS — F4310 Post-traumatic stress disorder, unspecified: Secondary | ICD-10-CM

## 2018-02-28 DIAGNOSIS — F1010 Alcohol abuse, uncomplicated: Secondary | ICD-10-CM

## 2018-02-28 DIAGNOSIS — H9192 Unspecified hearing loss, left ear: Secondary | ICD-10-CM

## 2018-02-28 NOTE — Telephone Encounter (Signed)
Fleeta EmmerShanea was able to reach patient yesterday and schedule OV for Friday with Dr. Claris PongWendling/SLS

## 2018-02-28 NOTE — Progress Notes (Signed)
Referral placed. TY.  

## 2018-03-01 ENCOUNTER — Ambulatory Visit: Payer: Self-pay | Admitting: Family Medicine

## 2018-03-02 ENCOUNTER — Ambulatory Visit: Payer: 59 | Admitting: Family Medicine

## 2018-03-02 DIAGNOSIS — Z0289 Encounter for other administrative examinations: Secondary | ICD-10-CM

## 2018-03-08 ENCOUNTER — Ambulatory Visit: Payer: 59 | Admitting: Family Medicine

## 2018-03-08 ENCOUNTER — Encounter: Payer: Self-pay | Admitting: Family Medicine

## 2018-03-08 ENCOUNTER — Encounter (INDEPENDENT_AMBULATORY_CARE_PROVIDER_SITE_OTHER): Payer: Self-pay

## 2018-03-08 VITALS — BP 144/82 | HR 102 | Temp 98.1°F | Ht 72.0 in | Wt 263.0 lb

## 2018-03-08 DIAGNOSIS — F1510 Other stimulant abuse, uncomplicated: Secondary | ICD-10-CM | POA: Diagnosis not present

## 2018-03-08 DIAGNOSIS — F341 Dysthymic disorder: Secondary | ICD-10-CM | POA: Diagnosis not present

## 2018-03-08 DIAGNOSIS — I10 Essential (primary) hypertension: Secondary | ICD-10-CM | POA: Diagnosis not present

## 2018-03-08 DIAGNOSIS — R740 Nonspecific elevation of levels of transaminase and lactic acid dehydrogenase [LDH]: Secondary | ICD-10-CM | POA: Diagnosis not present

## 2018-03-08 DIAGNOSIS — R7401 Elevation of levels of liver transaminase levels: Secondary | ICD-10-CM

## 2018-03-08 MED ORDER — TADALAFIL 20 MG PO TABS: ORAL_TABLET | ORAL | 0 refills | Status: DC

## 2018-03-08 MED ORDER — LOSARTAN POTASSIUM-HCTZ 100-25 MG PO TABS: 1.0000 | ORAL_TABLET | Freq: Every day | ORAL | 3 refills | Status: DC

## 2018-03-08 MED ORDER — AMLODIPINE BESYLATE 5 MG PO TABS
5.0000 mg | ORAL_TABLET | Freq: Every day | ORAL | 3 refills | Status: DC
Start: 2018-03-08 — End: 2018-11-08

## 2018-03-08 MED ORDER — FLUOXETINE HCL 20 MG PO TABS: 20.0000 mg | ORAL_TABLET | Freq: Every day | ORAL | 3 refills | Status: DC

## 2018-03-08 MED ORDER — METOPROLOL SUCCINATE ER 50 MG PO TB24: 50.0000 mg | ORAL_TABLET | Freq: Every day | ORAL | 3 refills | Status: DC

## 2018-03-08 MED ORDER — ATORVASTATIN CALCIUM 80 MG PO TABS: 80.0000 mg | ORAL_TABLET | Freq: Every day | ORAL | 1 refills | Status: DC

## 2018-03-08 MED ORDER — FLUOXETINE HCL 10 MG PO TABS: 10.0000 mg | ORAL_TABLET | Freq: Every day | ORAL | 3 refills | Status: DC

## 2018-03-08 MED FILL — FLUoxetine HCL 20 MG TABS: 20 | 30 days supply | Qty: 30 | Fill #0

## 2018-03-08 MED FILL — AMLODIPINE BESYLATE 5 MG TA: 5 | 30 days supply | Qty: 30 | Fill #0

## 2018-03-08 MED FILL — LOSARTAN-HCTZ 100-25 MG TAB: 100-25 | 30 days supply | Qty: 30 | Fill #0

## 2018-03-08 MED FILL — TADALAFIL 20 MG TABS: 20 | 30 days supply | Qty: 3 | Fill #0

## 2018-03-08 MED FILL — ATORVASTATIN 80 MG TABLET: 80 | 30 days supply | Qty: 30 | Fill #0

## 2018-03-08 MED FILL — METOPROLOL SUCCINATE ER 50: 50 | 30 days supply | Qty: 30 | Fill #0

## 2018-03-08 MED FILL — FLUoxetine HCL 10 MG TABS: 10 | 30 days supply | Qty: 30 | Fill #0

## 2018-03-08 NOTE — Progress Notes (Signed)
Pre visit review using our clinic review tool, if applicable. No additional management support is needed unless otherwise documented below in the visit note. 

## 2018-03-08 NOTE — Progress Notes (Signed)
Chief Complaint  Patient presents with  . Follow-up    Subjective: Patient is a 47 y.o. male here for f/u liver enzymes.  Patient was found to have elevated liver enzymes.  He was consuming copious amount of alcohol to deal with the loss of his wife.  He continues to drink between 2-6 beers daily.  He does not feel this is an issue as he is down from around 18-20 drinks per day.  He was supposed to get an ultrasound of his liver after another transaminitis was detected on lab work.  He did not get this done.  He is also following up for bereavement, anxiety/depression, and suicidal ideation.  He does have an appointment with psychiatry on 7/23.  He was strongly encouraged to keep this appointment.  He is currently on Abilify 5 mg daily, and Prozac 30 mg daily.  He continues to self medicate with Adderall intermittently.  He is trying to stop. Feels like he is crawling out of his skin and curious if anything to help with withdrawal s/s's. He will have intermittent suicidal ideation, but admits he needs to live for his son   Pt tx'd for HTN, usually well controlled. He did not take today. Diet is poor. Not much exercise. No ae's from meds.  ROS: Heart: Denies chest pain  Lungs: Denies SOB   Past Medical History:  Diagnosis Date  . Anxiety   . Depression   . GERD (gastroesophageal reflux disease)   . Heart murmur   . History of chicken pox   . Hyperlipidemia   . Hypertension   . PTSD (post-traumatic stress disorder)   . Sleep apnea   . Tinnitus of both ears    Family History  Problem Relation Age of Onset  . Diabetes Mother   . Hyperlipidemia Mother   . Hypertension Mother   . Heart disease Mother   . Cancer Father   . Hyperlipidemia Father   . Hypertension Father   . Other Father        kidney removal  . Heart disease Father   . Kidney disease Father   . Cancer Sister   . Hypertension Sister   . Colon cancer Neg Hx   . Esophageal cancer Neg Hx   . Rectal cancer Neg Hx     . Stomach cancer Neg Hx    Allergies as of 03/08/2018   No Known Allergies     Medication List        Accurate as of 03/08/18 12:59 PM. Always use your most recent med list.          amLODipine 5 MG tablet Commonly known as:  NORVASC Take 1 tablet (5 mg total) by mouth daily.   ARIPiprazole 5 MG tablet Commonly known as:  ABILIFY TAKE 1 TABLET (5 MG TOTAL) BY MOUTH DAILY.   atorvastatin 80 MG tablet Commonly known as:  LIPITOR Take 1 tablet (80 mg total) by mouth daily.   FLUoxetine 20 MG tablet Commonly known as:  PROZAC Take 1 tablet (20 mg total) by mouth daily. Total of 30 mg daily.   FLUoxetine 10 MG tablet Commonly known as:  PROZAC Take 1 tablet (10 mg total) by mouth daily. Total of 30 mg daily.   Ginkgo Biloba Complex 440 MG Caps Take 1 capsule by mouth daily.   losartan-hydrochlorothiazide 100-25 MG tablet Commonly known as:  HYZAAR Take 1 tablet by mouth daily.   meclizine 25 MG tablet Commonly known as:  ANTIVERT Take  1 tablet (25 mg total) by mouth 3 (three) times daily as needed for dizziness.   metoprolol succinate 50 MG 24 hr tablet Commonly known as:  TOPROL-XL Take 1 tablet (50 mg total) by mouth daily.   multivitamin tablet Take 1 tablet by mouth daily.   NEEDLE (DISP) 22 G 22G X 1" Misc Use to inject testosterone intramuscularly every 2 weeks.   Omega-3 1000 MG Caps Take by mouth. Take 4 g by mouth daily.   pantoprazole 40 MG tablet Commonly known as:  PROTONIX TAKE 1 TABLET (40 MG TOTAL) BY MOUTH DAILY.   tadalafil 20 MG tablet Commonly known as:  CIALIS TAKE 1 TABLET BY MOUTH DAILY AS NEEDED FOR ERECTILE DYSFUNCTION   testosterone cypionate 200 MG/ML injection Commonly known as:  DEPOTESTOSTERONE CYPIONATE Inject 1 mL (200 mg total) into the muscle every 14 (fourteen) days.       Objective: BP (!) 144/82 (BP Location: Left Arm, Patient Position: Sitting, Cuff Size: Large)   Pulse (!) 102   Temp 98.1 F (36.7 C) (Oral)    Ht 6' (1.829 m)   Wt 263 lb (119.3 kg)   SpO2 97%   BMI 35.67 kg/m  General: Awake, appears stated age HEENT: MMM, EOMi Heart: RRR, no LE edema, no bruits Lungs: CTAB, no rales, wheezes or rhonchi. No accessory muscle use Abd: Soft, mildly distended, various areas of ttp (chronic), no masses or organomegaly, liver not palpable  Psych: Age appropriate judgment and insight, normal affect and mood  Assessment and Plan: ANXIETY DEPRESSION - Plan: FLUoxetine (PROZAC) 20 MG tablet, Hepatic function panel  Transaminitis  HYPERTENSION, BENIGN ESSENTIAL  Stimulant abuse (HCC)  Orders as above. Cont meds, DO NOT miss appt. Stop drinking. Will recheck, if still elevated, he is agreeable to get US done.  Cont current meds, stressed importance of compliance. Will try Benadryl to help w withdrawal symptoms.  F/u in 3 mo for med check.  The patient voiced understanding and agreement to the plan.  Jilda Rocheicholas Paul WaldoWendling, DO 03/08/18  12:59 PM

## 2018-03-08 NOTE — Patient Instructions (Signed)
If no improvement in liver enzymes, will get the ultrasound again.   Try to cut down on drinking.  We will send you a message after speaking with pharmacy team about anything to help with withdrawals.  DO NOT miss your appointment with the psychiatry team.   Let us know if you need anything.

## 2018-03-09 ENCOUNTER — Encounter: Payer: Self-pay | Admitting: Rehabilitative and Restorative Service Providers"

## 2018-03-09 ENCOUNTER — Other Ambulatory Visit (INDEPENDENT_AMBULATORY_CARE_PROVIDER_SITE_OTHER): Payer: 59

## 2018-03-09 DIAGNOSIS — E785 Hyperlipidemia, unspecified: Secondary | ICD-10-CM

## 2018-03-09 DIAGNOSIS — R7401 Elevation of levels of liver transaminase levels: Secondary | ICD-10-CM

## 2018-03-09 DIAGNOSIS — R7303 Prediabetes: Secondary | ICD-10-CM | POA: Diagnosis not present

## 2018-03-09 DIAGNOSIS — R740 Nonspecific elevation of levels of transaminase and lactic acid dehydrogenase [LDH]: Secondary | ICD-10-CM

## 2018-03-09 LAB — LIPID PANEL
CHOL/HDL RATIO: 5
Cholesterol: 191 mg/dL (ref 0–200)
HDL: 42.3 mg/dL (ref >39.00)
LDL CALC: 124 mg/dL — AB (ref 0–99)
NonHDL: 148.3
Triglycerides: 124 mg/dL (ref 0.0–149.0)
VLDL: 24.8 mg/dL (ref 0.0–40.0)

## 2018-03-09 LAB — HEPATIC FUNCTION PANEL
ALBUMIN: 4.2 g/dL (ref 3.5–5.2)
ALT: 37 U/L (ref 0–53)
AST: 22 U/L (ref 0–37)
Alkaline Phosphatase: 47 U/L (ref 39–117)
Bilirubin, Direct: 0.1 mg/dL (ref 0.0–0.3)
TOTAL PROTEIN: 6.8 g/dL (ref 6.0–8.3)
Total Bilirubin: 0.6 mg/dL (ref 0.2–1.2)

## 2018-03-09 LAB — HEMOGLOBIN A1C: Hgb A1c MFr Bld: 5.8 % (ref 4.6–6.5)

## 2018-03-09 NOTE — Addendum Note (Signed)
Addended by: Verdie ShireBAYNES, ANGELA M on: 03/09/2018 01:42 PM   Modules accepted: Orders

## 2018-03-09 NOTE — Therapy (Signed)
[facility] [address] [place], [place], [address] Phone: [phone]   Fax:  [phone]  Patient Details  Name: Aaron [name] MRN: [id number] Date of Birth: 05/10/1971  Encounter Date: 11/17/2017 last encounter  PHYSICAL THERAPY DISCHARGE SUMMARY  Visits from Start of Care: eval only   Patient did not return to PT.  Please refer to initial evaluation for patient status. Thank you for the referral of this patient. Margretta Dittyhristina Emmett Arntz, MPT       Lindie Roberson 03/09/2018, 9:23 AM  [facility] [address] [place], [place], [address] Phone: [phone]   Fax:  [phone]

## 2018-03-12 ENCOUNTER — Encounter: Payer: Self-pay | Admitting: Family Medicine

## 2018-04-02 MED FILL — MECLIZINE 25 MG TABLET: 25 | 10 days supply | Qty: 30 | Fill #0

## 2018-04-05 ENCOUNTER — Encounter (HOSPITAL_COMMUNITY): Payer: Self-pay | Admitting: Psychiatry

## 2018-04-10 ENCOUNTER — Ambulatory Visit (HOSPITAL_COMMUNITY): Payer: Self-pay | Admitting: Psychiatry

## 2018-04-16 IMAGING — DX DG ABDOMEN ACUTE W/ 1V CHEST
5 series · 5 of 5 positions shown · non-contrast
Comparison: 09/25/2017 chest radiograph. 02/06/2017 CT abdomen and
pelvis.

CLINICAL DATA: 46 y/o  M; epigastric pain.

EXAM:
DG ABDOMEN ACUTE W/ 1V CHEST

[chest pa]
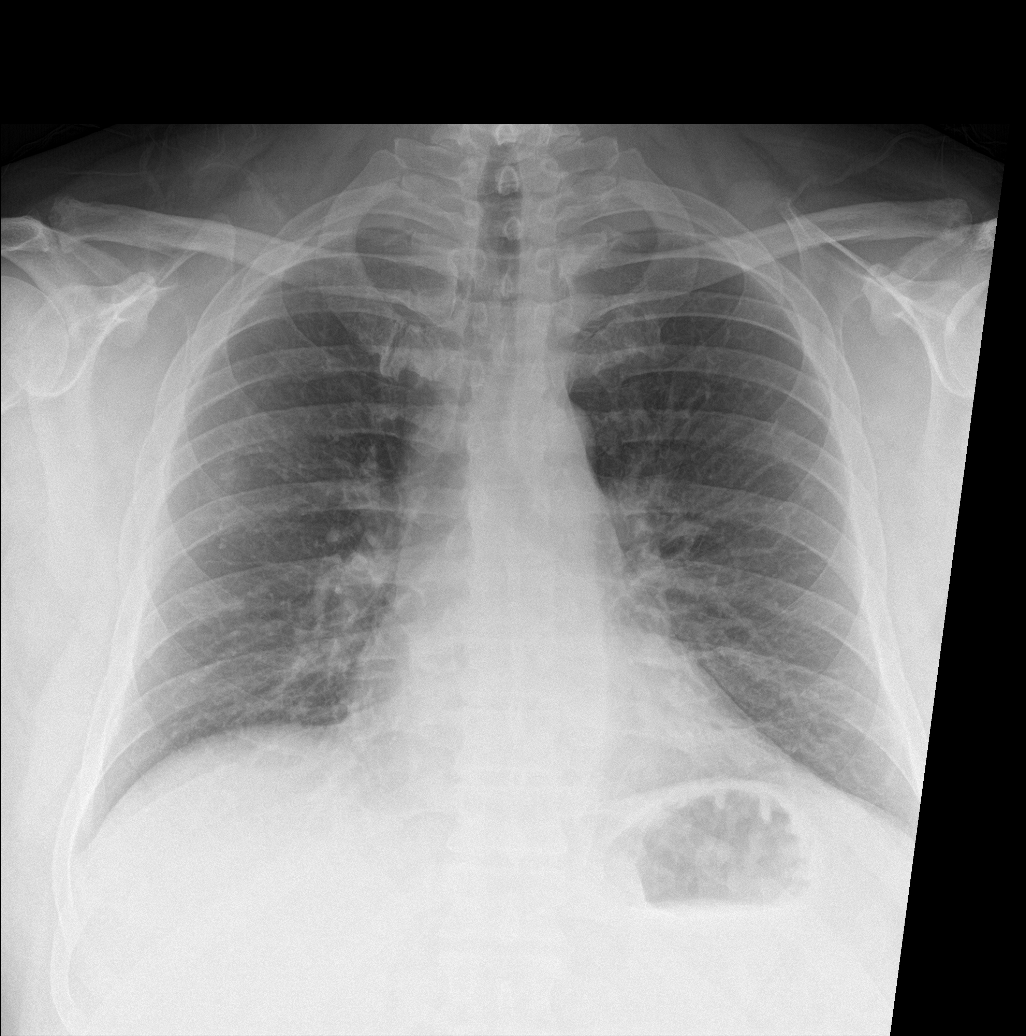

[abdomen erect (1 of 2)]
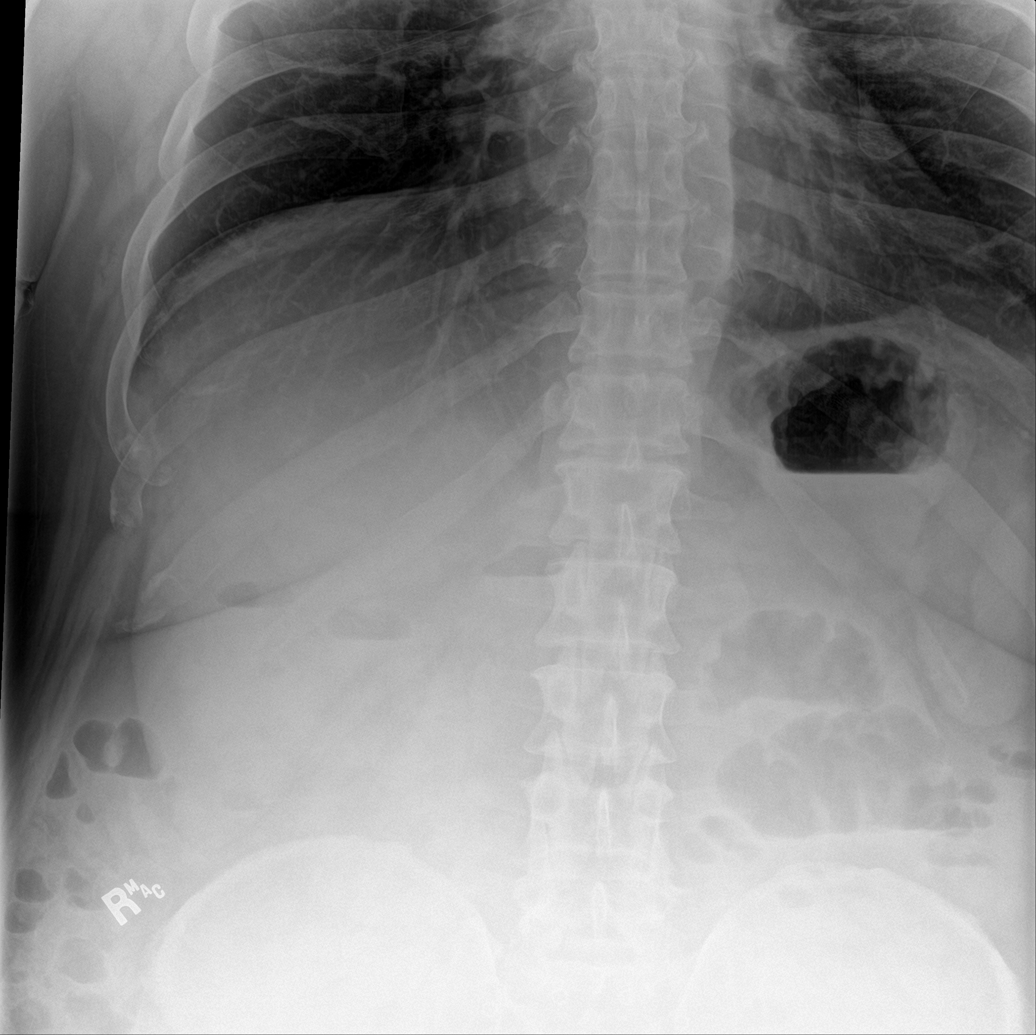

[abdomen supine (1 of 2)]
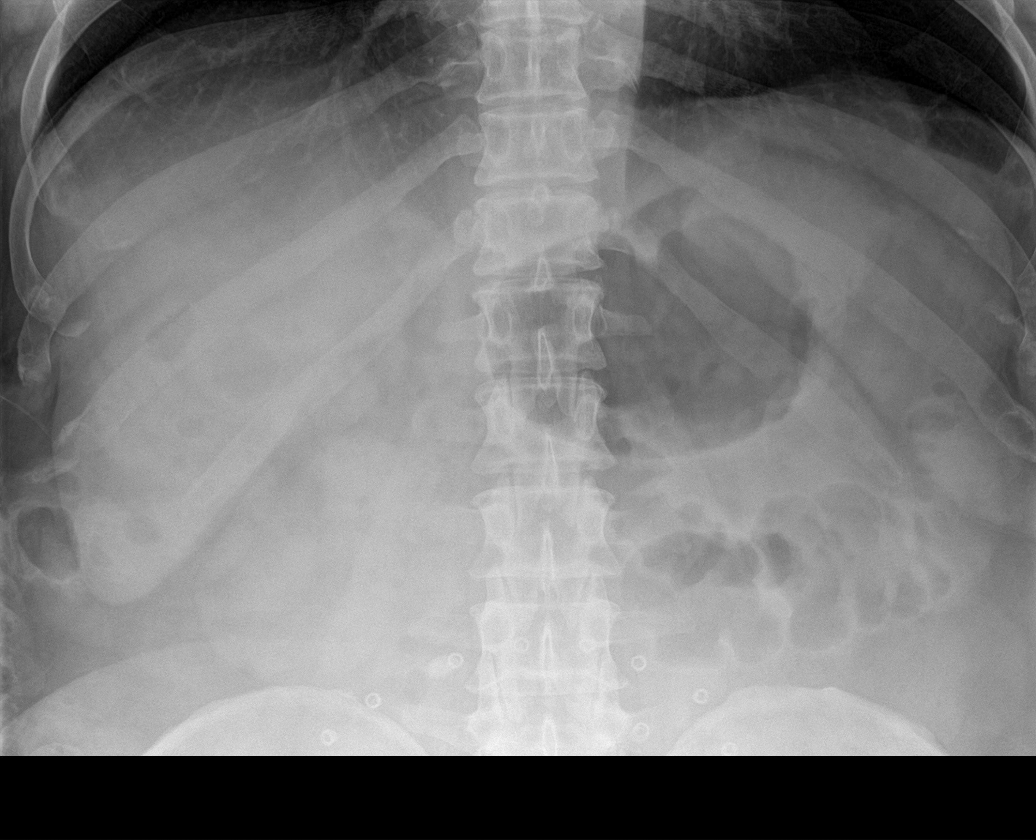

[abdomen supine (2 of 2)]
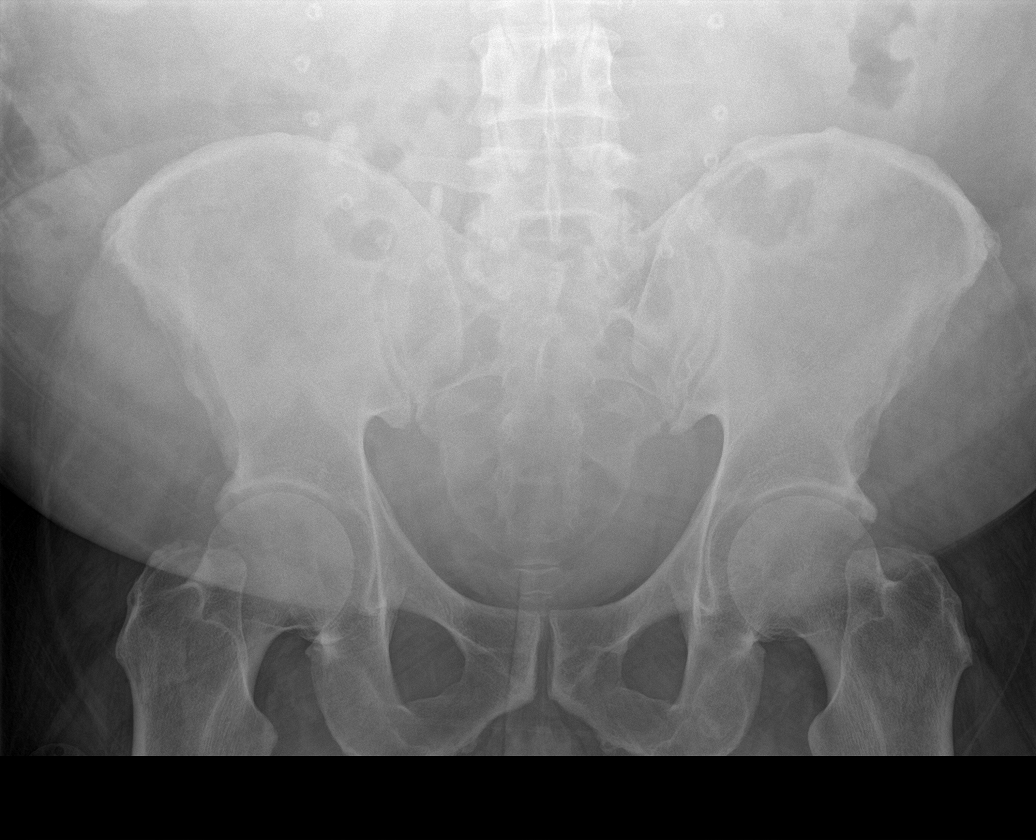

[abdomen erect (2 of 2)]
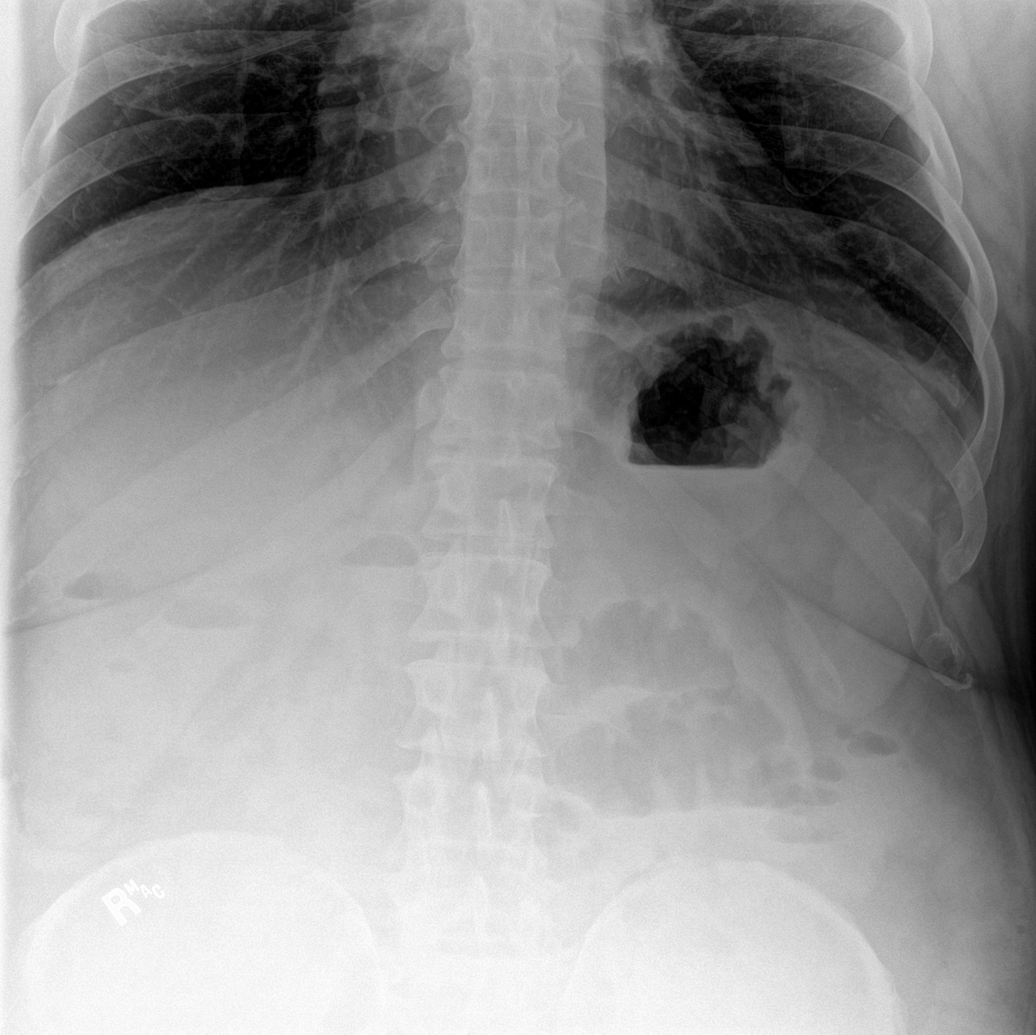

[5 of 5 positions shown; findings below may reference images not displayed]

FINDINGS: There is no evidence of dilated bowel loops or free intraperitoneal
air. Hernia repair mesh noted. No radiopaque calculi or other
significant radiographic abnormality is seen. Heart size and
mediastinal contours are within normal limits. Both lungs are clear.
IMPRESSION: Negative abdominal radiographs.  No acute cardiopulmonary disease.

By: Alhadji Ali Geoffrey M.D.

## 2018-06-04 DIAGNOSIS — L638 Other alopecia areata: Secondary | ICD-10-CM | POA: Diagnosis not present

## 2018-06-04 DIAGNOSIS — L639 Alopecia areata, unspecified: Secondary | ICD-10-CM | POA: Diagnosis not present

## 2018-06-15 ENCOUNTER — Telehealth: Payer: Self-pay

## 2018-06-15 NOTE — Telephone Encounter (Signed)
PA initiated via Covermymeds; KEY: [id number]. Awaiting determination.

## 2018-06-19 NOTE — Telephone Encounter (Signed)
PA approved. Effective from 06/15/2018 through 09/18/2038.

## 2018-06-22 DIAGNOSIS — F322 Major depressive disorder, single episode, severe without psychotic features: Secondary | ICD-10-CM | POA: Diagnosis not present

## 2018-06-29 DIAGNOSIS — F322 Major depressive disorder, single episode, severe without psychotic features: Secondary | ICD-10-CM | POA: Diagnosis not present

## 2018-07-04 MED FILL — TESTOSTERONE CYP 200 MG/ML: 200 | 28 days supply | Qty: 2 | Fill #0

## 2018-07-05 DIAGNOSIS — F322 Major depressive disorder, single episode, severe without psychotic features: Secondary | ICD-10-CM | POA: Diagnosis not present

## 2018-07-12 DIAGNOSIS — F322 Major depressive disorder, single episode, severe without psychotic features: Secondary | ICD-10-CM | POA: Diagnosis not present

## 2018-07-19 DIAGNOSIS — F322 Major depressive disorder, single episode, severe without psychotic features: Secondary | ICD-10-CM | POA: Diagnosis not present

## 2018-07-20 DIAGNOSIS — L638 Other alopecia areata: Secondary | ICD-10-CM | POA: Diagnosis not present

## 2018-07-27 DIAGNOSIS — F322 Major depressive disorder, single episode, severe without psychotic features: Secondary | ICD-10-CM | POA: Diagnosis not present

## 2018-08-02 DIAGNOSIS — F322 Major depressive disorder, single episode, severe without psychotic features: Secondary | ICD-10-CM | POA: Diagnosis not present

## 2018-11-08 ENCOUNTER — Ambulatory Visit: Payer: BLUE CROSS/BLUE SHIELD | Admitting: Medical

## 2018-11-08 ENCOUNTER — Encounter: Payer: Self-pay | Admitting: Medical

## 2018-11-08 VITALS — BP 160/88 | HR 90 | Temp 98.6°F | Resp 16 | Ht 72.0 in | Wt 295.9 lb

## 2018-11-08 DIAGNOSIS — J0100 Acute maxillary sinusitis, unspecified: Secondary | ICD-10-CM

## 2018-11-08 DIAGNOSIS — H6690 Otitis media, unspecified, unspecified ear: Secondary | ICD-10-CM

## 2018-11-08 DIAGNOSIS — J40 Bronchitis, not specified as acute or chronic: Secondary | ICD-10-CM

## 2018-11-08 DIAGNOSIS — R059 Cough, unspecified: Secondary | ICD-10-CM

## 2018-11-08 DIAGNOSIS — I10 Essential (primary) hypertension: Secondary | ICD-10-CM | POA: Diagnosis not present

## 2018-11-08 DIAGNOSIS — R05 Cough: Secondary | ICD-10-CM

## 2018-11-08 MED ORDER — METOPROLOL SUCCINATE ER 50 MG PO TB24: 50.0000 mg | ORAL_TABLET | Freq: Every day | ORAL | 3 refills | Status: DC

## 2018-11-08 MED ORDER — LOSARTAN POTASSIUM-HCTZ 100-25 MG PO TABS: 1.0000 | ORAL_TABLET | Freq: Every day | ORAL | 3 refills | Status: DC

## 2018-11-08 MED ORDER — FLUTICASONE PROPIONATE 50 MCG/ACT NA SUSP: 2.0000 | Freq: Every day | NASAL | 1 refills | Status: DC

## 2018-11-08 MED ORDER — DOXYCYCLINE HYCLATE 100 MG PO TABS: 100.0000 mg | ORAL_TABLET | Freq: Two times a day (BID) | ORAL | 0 refills | Status: DC

## 2018-11-08 MED ORDER — AMLODIPINE BESYLATE 5 MG PO TABS: 5.0000 mg | ORAL_TABLET | Freq: Every day | ORAL | 3 refills | Status: DC

## 2018-11-08 MED ORDER — BENZONATATE 100 MG PO CAPS: 100.0000 mg | ORAL_CAPSULE | Freq: Three times a day (TID) | ORAL | 0 refills | Status: DC | PRN

## 2018-11-08 NOTE — Progress Notes (Signed)
Subjective:    Patient ID: Aaron [name], male    DOB: [birth year], 48 y.o.   MRN: [id number]  HPI  Pt in for recent chest congestion and productive brown mucus. Also some nasal congestion, sinus pressure and rt ear pain. Pt son has strep throat. Pt does not report severe st. Only faint scratchy.  Pt bp high today. No dizziness now. No gross motor or sensory function deficits. Ran out of bp meds 2 months ago.    Review of Systems  Constitutional: Negative for chills, fatigue and fever.  HENT: Positive for congestion, ear pain and sinus pressure.   Respiratory: Positive for cough. Negative for wheezing.        Chest congestion.  Cardiovascular: Negative for chest pain and palpitations.  Gastrointestinal: Negative for abdominal pain, blood in stool, diarrhea and nausea.  Musculoskeletal: Negative for back pain and myalgias.  Neurological: Negative for dizziness, tremors, syncope, numbness and headaches.  Hematological: Negative for adenopathy.  Psychiatric/Behavioral: Negative for behavioral problems and confusion.    Past Medical History:  Diagnosis Date  . Anxiety   . Depression   . GERD (gastroesophageal reflux disease)   . Heart murmur   . History of chicken pox   . Hyperlipidemia   . Hypertension   . PTSD (post-traumatic stress disorder)   . Sleep apnea   . Tinnitus of both ears      Social History   Socioeconomic History  . Marital status: Married    Spouse name: Not on file  . Number of children: 1  . Years of education: Not on file  . Highest education level: Not on file  Occupational History  . Not on file  Social Needs  . Financial resource strain: Not on file  . Food insecurity:    Worry: Not on file    Inability: Not on file  . Transportation needs:    Medical: Not on file    Non-medical: Not on file  Tobacco Use  . Smoking status: Former Smoker    Last attempt to quit: 09/19/1996    Years since quitting: 22.1  . Smokeless tobacco: Never Used    Substance and Sexual Activity  . Alcohol use: Yes    Comment: rarely, last use yesterday  . Drug use: No  . Sexual activity: Not on file  Lifestyle  . Physical activity:    Days per week: Not on file    Minutes per session: Not on file  . Stress: Not on file  Relationships  . Social connections:    Talks on phone: Not on file    Gets together: Not on file    Attends religious service: Not on file    Active member of club or organization: Not on file    Attends meetings of clubs or organizations: Not on file    Relationship status: Not on file  . Intimate partner violence:    Fear of current or ex partner: Not on file    Emotionally abused: Not on file    Physically abused: Not on file    Forced sexual activity: Not on file  Other Topics Concern  . Not on file  Social History Narrative  . Not on file    Past Surgical History:  Procedure Laterality Date  . broken fingers    . HERNIA REPAIR      Family History  Problem Relation Age of Onset  . Diabetes Mother   . Hyperlipidemia Mother   . Hypertension  Mother   . Heart disease Mother   . Cancer Father   . Hyperlipidemia Father   . Hypertension Father   . Other Father        kidney removal  . Heart disease Father   . Kidney disease Father   . Cancer Sister   . Hypertension Sister   . Colon cancer Neg Hx   . Esophageal cancer Neg Hx   . Rectal cancer Neg Hx   . Stomach cancer Neg Hx     No Known Allergies  Current Outpatient Medications on File Prior to Visit  Medication Sig Dispense Refill  . amLODipine (NORVASC) 5 MG tablet Take 1 tablet (5 mg total) by mouth daily. 90 tablet 3  . ARIPiprazole (ABILIFY) 5 MG tablet TAKE 1 TABLET (5 MG TOTAL) BY MOUTH DAILY. 30 tablet 2  . atorvastatin (LIPITOR) 80 MG tablet Take 1 tablet (80 mg total) by mouth daily. 90 tablet 1  . FLUoxetine (PROZAC) 10 MG tablet Take 1 tablet (10 mg total) by mouth daily. Total of 30 mg daily. 90 tablet 3  . FLUoxetine (PROZAC) 20 MG  tablet Take 1 tablet (20 mg total) by mouth daily. Total of 30 mg daily. 90 tablet 3  . Ginkgo Biloba Complex 440 MG CAPS Take 1 capsule by mouth daily.    Marland Kitchen. losartan-hydrochlorothiazide (HYZAAR) 100-25 MG tablet Take 1 tablet by mouth daily. 90 tablet 3  . meclizine (ANTIVERT) 25 MG tablet Take 1 tablet (25 mg total) by mouth 3 (three) times daily as needed for dizziness. 30 tablet 1  . metoprolol succinate (TOPROL-XL) 50 MG 24 hr tablet Take 1 tablet (50 mg total) by mouth daily. 90 tablet 3  . Multiple Vitamin (MULTIVITAMIN) tablet Take 1 tablet by mouth daily.    Marland Kitchen. NEEDLE, DISP, 22 G 22G X 1" MISC Use to inject testosterone intramuscularly every 2 weeks. 25 each 0  . Omega-3 1000 MG CAPS Take by mouth. Take 4 g by mouth daily.    . pantoprazole (PROTONIX) 40 MG tablet TAKE 1 TABLET (40 MG TOTAL) BY MOUTH DAILY. 90 tablet 1  . tadalafil (CIALIS) 20 MG tablet TAKE 1 TABLET BY MOUTH DAILY AS NEEDED FOR ERECTILE DYSFUNCTION 10 tablet 0  . testosterone cypionate (DEPOTESTOSTERONE CYPIONATE) 200 MG/ML injection Inject 1 mL (200 mg total) into the muscle every 14 (fourteen) days. 10 mL 0   No current facility-administered medications on file prior to visit.     BP (!) 177/79   Pulse 90   Temp 98.6 F (37 C) (Oral)   Resp 16   Ht 6' (1.829 m)   Wt 295 lb 14.4 oz (134.2 kg)   SpO2 98%   BMI 40.13 kg/m       Objective:   Physical Exam  General  Mental Status - Alert. General Appearance - Well groomed. Not in acute distress.  Skin Rashes- No Rashes.  HEENT Head- Normal. Ear Auditory Canal - Left- Normal. Right - Normal.Tympanic Membrane- Left- moderate rt tm redness. Right- mild red tm Eye Sclera/Conjunctiva- Left- Normal. Right- Normal. Nose & Sinuses Nasal Mucosa- Left-  Boggy and Congested. Right-  Boggy and  Congested.Bilateral maxillary and frontal sinus pressure. Mouth & Throat Lips: Upper Lip- Normal: no dryness, cracking, pallor, cyanosis, or vesicular eruption. Lower  Lip-Normal: no dryness, cracking, pallor, cyanosis or vesicular eruption. Buccal Mucosa- Bilateral- No Aphthous ulcers. Oropharynx- No Discharge or Erythema. Tonsils: Characteristics- Bilateral- No Erythema or Congestion. Size/Enlargement- Bilateral- No enlargement. Discharge- bilateral-None.  Neck Neck- Supple. No Masses.   Chest and Lung Exam Auscultation: Breath Sounds:-Clear even and unlabored.  Cardiovascular Auscultation:Rythm- Regular, rate and rhythm. Murmurs & Other Heart Sounds:Ausculatation of the heart reveal- No Murmurs.  Lymphatic Head & Neck General Head & Neck Lymphatics: Bilateral: Description- No Localized lymphadenopathy.  Neuro- CN III- XII intact. Gross motor and sensory function intact.       Assessment & Plan:   You appear to have bronchitis and sinusitis with ear infetion. Rest hydrate and tylenol for fever. I am prescribing cough medicine benzonatate, and doxycycline antibiotic. For your nasal congestion flonase.  You should gradually get better. If not then notify us and would recommend a chest xray.  For htn refilled your 3 bp meds. Please start back on today.  Follow up in 7-10 days or as needed

## 2018-11-08 NOTE — Patient Instructions (Signed)
You appear to have bronchitis and sinusitis with ear infetion. Rest hydrate and tylenol for fever. I am prescribing cough medicine benzonatate, and doxycycline antibiotic. For your nasal congestion flonase.  You should gradually get better. If not then notify us and would recommend a chest xray.  For htn refilled your 3 bp meds. Please start back on today.  Follow up in 7-10 days or as needed

## 2018-11-16 ENCOUNTER — Ambulatory Visit: Payer: Self-pay | Admitting: Family Medicine

## 2019-06-27 ENCOUNTER — Other Ambulatory Visit: Payer: Self-pay

## 2019-06-27 ENCOUNTER — Encounter: Payer: Self-pay | Admitting: Medical

## 2019-06-27 ENCOUNTER — Ambulatory Visit (INDEPENDENT_AMBULATORY_CARE_PROVIDER_SITE_OTHER): Payer: BC Managed Care – PPO | Admitting: Medical

## 2019-06-27 VITALS — BP 162/89 | HR 102 | Temp 97.3°F | Resp 16 | Ht 72.0 in | Wt 277.0 lb

## 2019-06-27 DIAGNOSIS — Z20822 Contact with and (suspected) exposure to covid-19: Secondary | ICD-10-CM

## 2019-06-27 DIAGNOSIS — Z20828 Contact with and (suspected) exposure to other viral communicable diseases: Secondary | ICD-10-CM | POA: Diagnosis not present

## 2019-06-27 DIAGNOSIS — Z23 Encounter for immunization: Secondary | ICD-10-CM | POA: Diagnosis not present

## 2019-06-27 DIAGNOSIS — Z111 Encounter for screening for respiratory tuberculosis: Secondary | ICD-10-CM

## 2019-06-27 DIAGNOSIS — Z113 Encounter for screening for infections with a predominantly sexual mode of transmission: Secondary | ICD-10-CM | POA: Diagnosis not present

## 2019-06-27 MED ORDER — ATORVASTATIN CALCIUM 80 MG PO TABS: 80.0000 mg | ORAL_TABLET | Freq: Every day | ORAL | 1 refills | Status: DC

## 2019-06-27 MED ORDER — LOSARTAN POTASSIUM-HCTZ 100-25 MG PO TABS: 1.0000 | ORAL_TABLET | Freq: Every day | ORAL | 3 refills | Status: DC

## 2019-06-27 NOTE — Progress Notes (Signed)
Subjective:    Patient ID: Aaron [name], male    DOB: 10-10-1970, 48 y.o.   MRN: [id number]  HPI Pt in for screening test. HIV, tb,  Corono virus screening test and flu vaccine.  Will attend drug rehab with [facility].  Pt bp is elevated. He stopped taking his bp meds months ago. Pt was on 3 meds. He wants to just be on less. He agress to get back on hyzaar.  Pt also stopped lipid meds.   Review of Systems  Constitutional: Negative for chills, fatigue and fever.  Respiratory: Negative for cough, chest tightness, shortness of breath and wheezing.   Cardiovascular: Negative for chest pain and palpitations.  Gastrointestinal: Negative for abdominal pain, diarrhea, nausea and vomiting.  Genitourinary: Negative for enuresis, frequency and urgency.  Musculoskeletal: Negative for back pain, myalgias and neck stiffness.  Skin: Negative for rash.  Neurological: Negative for dizziness, speech difficulty, weakness, numbness and headaches.  Hematological: Negative for adenopathy. Does not bruise/bleed easily.  Psychiatric/Behavioral: Negative for behavioral problems, confusion and dysphoric mood. The patient is not nervous/anxious.    Past Medical History:  Diagnosis Date  . Anxiety   . Depression   . GERD (gastroesophageal reflux disease)   . Heart murmur   . History of chicken pox   . Hyperlipidemia   . Hypertension   . PTSD (post-traumatic stress disorder)   . Sleep apnea   . Tinnitus of both ears      Social History   Socioeconomic History  . Marital status: Married    Spouse name: Not on file  . Number of children: 1  . Years of education: Not on file  . Highest education level: Not on file  Occupational History  . Not on file  Social Needs  . Financial resource strain: Not on file  . Food insecurity    Worry: Not on file    Inability: Not on file  . Transportation needs    Medical: Not on file    Non-medical: Not on file  Tobacco Use  . Smoking  status: Former Smoker    Quit date: 09/19/1996    Years since quitting: 22.7  . Smokeless tobacco: Never Used  Substance and Sexual Activity  . Alcohol use: Yes    Comment: rarely, last use yesterday  . Drug use: No  . Sexual activity: Not on file  Lifestyle  . Physical activity    Days per week: Not on file    Minutes per session: Not on file  . Stress: Not on file  Relationships  . Social Herbalist on phone: Not on file    Gets together: Not on file    Attends religious service: Not on file    Active member of club or organization: Not on file    Attends meetings of clubs or organizations: Not on file    Relationship status: Not on file  . Intimate partner violence    Fear of current or ex partner: Not on file    Emotionally abused: Not on file    Physically abused: Not on file    Forced sexual activity: Not on file  Other Topics Concern  . Not on file  Social History Narrative  . Not on file    Past Surgical History:  Procedure Laterality Date  . broken fingers    . HERNIA REPAIR      Family History  Problem Relation Age of Onset  . Diabetes Mother   .  Hyperlipidemia Mother   . Hypertension Mother   . Heart disease Mother   . Cancer Father   . Hyperlipidemia Father   . Hypertension Father   . Other Father        kidney removal  . Heart disease Father   . Kidney disease Father   . Cancer Sister   . Hypertension Sister   . Colon cancer Neg Hx   . Esophageal cancer Neg Hx   . Rectal cancer Neg Hx   . Stomach cancer Neg Hx     No Known Allergies  Current Outpatient Medications on File Prior to Visit  Medication Sig Dispense Refill  . amLODipine (NORVASC) 5 MG tablet Take 1 tablet (5 mg total) by mouth daily. (Patient not taking: Reported on 06/27/2019) 90 tablet 3  . ARIPiprazole (ABILIFY) 5 MG tablet TAKE 1 TABLET (5 MG TOTAL) BY MOUTH DAILY. (Patient not taking: Reported on 06/27/2019) 30 tablet 2  . atorvastatin (LIPITOR) 80 MG tablet Take 1  tablet (80 mg total) by mouth daily. (Patient not taking: Reported on 06/27/2019) 90 tablet 1  . benzonatate (TESSALON) 100 MG capsule Take 1 capsule (100 mg total) by mouth 3 (three) times daily as needed for cough. (Patient not taking: Reported on 06/27/2019) 30 capsule 0  . doxycycline (VIBRA-TABS) 100 MG tablet Take 1 tablet (100 mg total) by mouth 2 (two) times daily. Can give caps or generic (Patient not taking: Reported on 06/27/2019) 20 tablet 0  . FLUoxetine (PROZAC) 10 MG tablet Take 1 tablet (10 mg total) by mouth daily. Total of 30 mg daily. (Patient not taking: Reported on 06/27/2019) 90 tablet 3  . FLUoxetine (PROZAC) 20 MG tablet Take 1 tablet (20 mg total) by mouth daily. Total of 30 mg daily. (Patient not taking: Reported on 06/27/2019) 90 tablet 3  . fluticasone (FLONASE) 50 MCG/ACT nasal spray Place 2 sprays into both nostrils daily. (Patient not taking: Reported on 06/27/2019) 16 g 1  . Ginkgo Biloba Complex 440 MG CAPS Take 1 capsule by mouth daily.    Marland Kitchen. losartan-hydrochlorothiazide (HYZAAR) 100-25 MG tablet Take 1 tablet by mouth daily. (Patient not taking: Reported on 06/27/2019) 90 tablet 3  . meclizine (ANTIVERT) 25 MG tablet Take 1 tablet (25 mg total) by mouth 3 (three) times daily as needed for dizziness. (Patient not taking: Reported on 06/27/2019) 30 tablet 1  . metoprolol succinate (TOPROL-XL) 50 MG 24 hr tablet Take 1 tablet (50 mg total) by mouth daily. (Patient not taking: Reported on 06/27/2019) 90 tablet 3  . Multiple Vitamin (MULTIVITAMIN) tablet Take 1 tablet by mouth daily.    Marland Kitchen. NEEDLE, DISP, 22 G 22G X 1" MISC Use to inject testosterone intramuscularly every 2 weeks. (Patient not taking: Reported on 06/27/2019) 25 each 0  . Omega-3 1000 MG CAPS Take by mouth. Take 4 g by mouth daily.    . pantoprazole (PROTONIX) 40 MG tablet TAKE 1 TABLET (40 MG TOTAL) BY MOUTH DAILY. (Patient not taking: Reported on 06/27/2019) 90 tablet 1  . tadalafil (CIALIS) 20 MG tablet TAKE 1 TABLET  BY MOUTH DAILY AS NEEDED FOR ERECTILE DYSFUNCTION (Patient not taking: Reported on 06/27/2019) 10 tablet 0  . testosterone cypionate (DEPOTESTOSTERONE CYPIONATE) 200 MG/ML injection Inject 1 mL (200 mg total) into the muscle every 14 (fourteen) days. (Patient not taking: Reported on 06/27/2019) 10 mL 0   No current facility-administered medications on file prior to visit.     BP (!) 162/89   Pulse (!) 102  Temp (!) 97.3 F (36.3 C) (Temporal)   Resp 16   Ht 6' (1.829 m)   Wt 277 lb (125.6 kg)   SpO2 99%   BMI 37.57 kg/m       Objective:   Physical Exam  General Mental Status- Alert. General Appearance- Not in acute distress.     Neck Carotid Arteries- Normal color. Moisture- Normal Moisture. No carotid bruits. No JVD.  Chest and Lung Exam Auscultation: Breath Sounds:-Normal.  Cardiovascular Auscultation:Rythm- Regular. Murmurs & Other Heart Sounds:Auscultation of the heart reveals- No Murmurs.   Neurologic Cranial Nerve exam:- CN III-XII intact(No nystagmus), symmetric smile. Strength:- 5/5 equal and symmetric strength both upper and lower extremities.      Assessment & Plan:  Placed orders for test today. Explained location for covid test and process.   For htn refilled hyzaar. May need to get back on 3 med regimen but presently only wants to use one med. So do recommend checking bp in about one week and notify us of bp level.  For high cholesterol refilled your atorvastatin.  Follow up as regularly scheduled with pcp or as needed  Ask to my chart [place] or call report on bp level in one week.  25 minutes spent with pt today. 50% of time spent counseling on need to start back on medications and answering questions regarding turn around time for lab work.  Esperanza Richters, PA-C

## 2019-06-27 NOTE — Addendum Note (Signed)
Addended by: Kelle Darting A on: 06/27/2019 09:02 AM   Modules accepted: Orders

## 2019-06-27 NOTE — Patient Instructions (Signed)
Placed orders for test today. Explained location for covid test and process.   For htn refilled hyzaar. May need to get back on 3 med regimen but presently only wants to use one med. So do recommend checking bp in about one week and notify us of bp level.  For high cholesterol refilled your atorvastatin.  Follow up as regularly scheduled with pcp or as needed  Ask to my chart [place] or call report on bp level in one week.

## 2019-06-27 NOTE — Addendum Note (Signed)
Addended by: Hinton Dyer on: 06/27/2019 09:25 AM   Modules accepted: Orders

## 2019-06-28 LAB — NOVEL CORONAVIRUS, NAA: SARS-CoV-2, NAA: NOT DETECTED

## 2019-06-28 LAB — HIV ANTIBODY (ROUTINE TESTING W REFLEX): HIV 1&2 Ab, 4th Generation: NONREACTIVE

## 2019-06-29 LAB — QUANTIFERON-TB GOLD PLUS
Mitogen-NIL: 9.61 IU/mL
NIL: 0.05 IU/mL
QuantiFERON-TB Gold Plus: NEGATIVE
TB1-NIL: 0.02 IU/mL
TB2-NIL: <0 IU/mL

## 2019-09-06 DIAGNOSIS — R05 Cough: Secondary | ICD-10-CM | POA: Diagnosis not present

## 2019-09-06 DIAGNOSIS — J069 Acute upper respiratory infection, unspecified: Secondary | ICD-10-CM | POA: Diagnosis not present

## 2019-09-06 DIAGNOSIS — U071 COVID-19: Secondary | ICD-10-CM | POA: Diagnosis not present

## 2019-09-06 DIAGNOSIS — R0981 Nasal congestion: Secondary | ICD-10-CM | POA: Diagnosis not present

## 2020-05-27 DIAGNOSIS — Z20828 Contact with and (suspected) exposure to other viral communicable diseases: Secondary | ICD-10-CM | POA: Diagnosis not present

## 2020-05-29 DIAGNOSIS — U071 COVID-19: Secondary | ICD-10-CM | POA: Diagnosis not present

## 2020-05-29 DIAGNOSIS — J101 Influenza due to other identified influenza virus with other respiratory manifestations: Secondary | ICD-10-CM | POA: Diagnosis not present

## 2020-05-29 DIAGNOSIS — B974 Respiratory syncytial virus as the cause of diseases classified elsewhere: Secondary | ICD-10-CM | POA: Diagnosis not present

## 2020-05-29 DIAGNOSIS — Z20828 Contact with and (suspected) exposure to other viral communicable diseases: Secondary | ICD-10-CM | POA: Diagnosis not present

## 2020-06-05 DIAGNOSIS — Z20828 Contact with and (suspected) exposure to other viral communicable diseases: Secondary | ICD-10-CM | POA: Diagnosis not present

## 2020-06-05 DIAGNOSIS — U071 COVID-19: Secondary | ICD-10-CM | POA: Diagnosis not present

## 2020-06-05 DIAGNOSIS — J101 Influenza due to other identified influenza virus with other respiratory manifestations: Secondary | ICD-10-CM | POA: Diagnosis not present

## 2020-06-05 DIAGNOSIS — B974 Respiratory syncytial virus as the cause of diseases classified elsewhere: Secondary | ICD-10-CM | POA: Diagnosis not present

## 2020-06-12 DIAGNOSIS — Z20828 Contact with and (suspected) exposure to other viral communicable diseases: Secondary | ICD-10-CM | POA: Diagnosis not present

## 2020-06-12 DIAGNOSIS — U071 COVID-19: Secondary | ICD-10-CM | POA: Diagnosis not present

## 2020-06-19 DIAGNOSIS — Z20828 Contact with and (suspected) exposure to other viral communicable diseases: Secondary | ICD-10-CM | POA: Diagnosis not present

## 2020-06-19 DIAGNOSIS — U071 COVID-19: Secondary | ICD-10-CM | POA: Diagnosis not present

## 2020-06-26 DIAGNOSIS — Z20828 Contact with and (suspected) exposure to other viral communicable diseases: Secondary | ICD-10-CM | POA: Diagnosis not present

## 2020-06-26 DIAGNOSIS — U071 COVID-19: Secondary | ICD-10-CM | POA: Diagnosis not present

## 2020-07-03 DIAGNOSIS — U071 COVID-19: Secondary | ICD-10-CM | POA: Diagnosis not present

## 2020-07-03 DIAGNOSIS — Z20828 Contact with and (suspected) exposure to other viral communicable diseases: Secondary | ICD-10-CM | POA: Diagnosis not present

## 2020-07-10 DIAGNOSIS — Z20828 Contact with and (suspected) exposure to other viral communicable diseases: Secondary | ICD-10-CM | POA: Diagnosis not present

## 2020-07-10 DIAGNOSIS — U071 COVID-19: Secondary | ICD-10-CM | POA: Diagnosis not present

## 2020-07-17 DIAGNOSIS — Z20828 Contact with and (suspected) exposure to other viral communicable diseases: Secondary | ICD-10-CM | POA: Diagnosis not present

## 2020-07-17 DIAGNOSIS — U071 COVID-19: Secondary | ICD-10-CM | POA: Diagnosis not present

## 2020-07-24 DIAGNOSIS — U071 COVID-19: Secondary | ICD-10-CM | POA: Diagnosis not present

## 2020-07-24 DIAGNOSIS — Z20828 Contact with and (suspected) exposure to other viral communicable diseases: Secondary | ICD-10-CM | POA: Diagnosis not present

## 2020-07-31 DIAGNOSIS — U071 COVID-19: Secondary | ICD-10-CM | POA: Diagnosis not present

## 2020-07-31 DIAGNOSIS — Z20828 Contact with and (suspected) exposure to other viral communicable diseases: Secondary | ICD-10-CM | POA: Diagnosis not present

## 2020-08-07 DIAGNOSIS — U071 COVID-19: Secondary | ICD-10-CM | POA: Diagnosis not present

## 2020-08-07 DIAGNOSIS — Z20828 Contact with and (suspected) exposure to other viral communicable diseases: Secondary | ICD-10-CM | POA: Diagnosis not present

## 2020-08-17 DIAGNOSIS — U071 COVID-19: Secondary | ICD-10-CM | POA: Diagnosis not present

## 2020-08-17 DIAGNOSIS — Z20828 Contact with and (suspected) exposure to other viral communicable diseases: Secondary | ICD-10-CM | POA: Diagnosis not present

## 2020-08-24 DIAGNOSIS — U071 COVID-19: Secondary | ICD-10-CM | POA: Diagnosis not present

## 2020-08-24 DIAGNOSIS — Z20828 Contact with and (suspected) exposure to other viral communicable diseases: Secondary | ICD-10-CM | POA: Diagnosis not present

## 2020-08-27 DIAGNOSIS — U071 COVID-19: Secondary | ICD-10-CM | POA: Diagnosis not present

## 2020-08-27 DIAGNOSIS — Z20828 Contact with and (suspected) exposure to other viral communicable diseases: Secondary | ICD-10-CM | POA: Diagnosis not present

## 2020-08-31 DIAGNOSIS — U071 COVID-19: Secondary | ICD-10-CM | POA: Diagnosis not present

## 2020-08-31 DIAGNOSIS — Z20828 Contact with and (suspected) exposure to other viral communicable diseases: Secondary | ICD-10-CM | POA: Diagnosis not present

## 2020-09-07 DIAGNOSIS — U071 COVID-19: Secondary | ICD-10-CM | POA: Diagnosis not present

## 2020-09-07 DIAGNOSIS — Z20828 Contact with and (suspected) exposure to other viral communicable diseases: Secondary | ICD-10-CM | POA: Diagnosis not present

## 2020-09-15 DIAGNOSIS — U071 COVID-19: Secondary | ICD-10-CM | POA: Diagnosis not present

## 2020-09-15 DIAGNOSIS — Z20828 Contact with and (suspected) exposure to other viral communicable diseases: Secondary | ICD-10-CM | POA: Diagnosis not present

## 2020-09-22 DIAGNOSIS — Z20828 Contact with and (suspected) exposure to other viral communicable diseases: Secondary | ICD-10-CM | POA: Diagnosis not present

## 2020-09-22 DIAGNOSIS — U071 COVID-19: Secondary | ICD-10-CM | POA: Diagnosis not present

## 2020-09-29 DIAGNOSIS — Z20828 Contact with and (suspected) exposure to other viral communicable diseases: Secondary | ICD-10-CM | POA: Diagnosis not present

## 2020-09-29 DIAGNOSIS — U071 COVID-19: Secondary | ICD-10-CM | POA: Diagnosis not present

## 2020-10-06 DIAGNOSIS — U071 COVID-19: Secondary | ICD-10-CM | POA: Diagnosis not present

## 2020-10-06 DIAGNOSIS — Z20828 Contact with and (suspected) exposure to other viral communicable diseases: Secondary | ICD-10-CM | POA: Diagnosis not present

## 2020-10-13 DIAGNOSIS — Z20828 Contact with and (suspected) exposure to other viral communicable diseases: Secondary | ICD-10-CM | POA: Diagnosis not present

## 2020-10-13 DIAGNOSIS — U071 COVID-19: Secondary | ICD-10-CM | POA: Diagnosis not present

## 2020-10-20 DIAGNOSIS — U071 COVID-19: Secondary | ICD-10-CM | POA: Diagnosis not present

## 2020-10-20 DIAGNOSIS — Z20828 Contact with and (suspected) exposure to other viral communicable diseases: Secondary | ICD-10-CM | POA: Diagnosis not present

## 2020-10-27 DIAGNOSIS — U071 COVID-19: Secondary | ICD-10-CM | POA: Diagnosis not present

## 2020-10-27 DIAGNOSIS — Z20828 Contact with and (suspected) exposure to other viral communicable diseases: Secondary | ICD-10-CM | POA: Diagnosis not present

## 2020-11-03 DIAGNOSIS — Z20828 Contact with and (suspected) exposure to other viral communicable diseases: Secondary | ICD-10-CM | POA: Diagnosis not present

## 2020-11-03 DIAGNOSIS — U071 COVID-19: Secondary | ICD-10-CM | POA: Diagnosis not present

## 2020-11-10 DIAGNOSIS — Z20828 Contact with and (suspected) exposure to other viral communicable diseases: Secondary | ICD-10-CM | POA: Diagnosis not present

## 2020-11-10 DIAGNOSIS — U071 COVID-19: Secondary | ICD-10-CM | POA: Diagnosis not present

## 2020-11-17 DIAGNOSIS — U071 COVID-19: Secondary | ICD-10-CM | POA: Diagnosis not present

## 2020-11-17 DIAGNOSIS — Z20828 Contact with and (suspected) exposure to other viral communicable diseases: Secondary | ICD-10-CM | POA: Diagnosis not present

## 2020-11-24 DIAGNOSIS — U071 COVID-19: Secondary | ICD-10-CM | POA: Diagnosis not present

## 2020-11-24 DIAGNOSIS — Z20828 Contact with and (suspected) exposure to other viral communicable diseases: Secondary | ICD-10-CM | POA: Diagnosis not present

## 2020-12-01 DIAGNOSIS — U071 COVID-19: Secondary | ICD-10-CM | POA: Diagnosis not present

## 2020-12-01 DIAGNOSIS — Z20828 Contact with and (suspected) exposure to other viral communicable diseases: Secondary | ICD-10-CM | POA: Diagnosis not present

## 2020-12-08 DIAGNOSIS — U071 COVID-19: Secondary | ICD-10-CM | POA: Diagnosis not present

## 2020-12-08 DIAGNOSIS — Z20828 Contact with and (suspected) exposure to other viral communicable diseases: Secondary | ICD-10-CM | POA: Diagnosis not present

## 2020-12-14 ENCOUNTER — Encounter: Payer: BC Managed Care – PPO | Admitting: Family Medicine

## 2020-12-16 ENCOUNTER — Other Ambulatory Visit: Payer: Self-pay

## 2020-12-16 ENCOUNTER — Ambulatory Visit (INDEPENDENT_AMBULATORY_CARE_PROVIDER_SITE_OTHER): Payer: BC Managed Care – PPO | Admitting: Family Medicine

## 2020-12-16 ENCOUNTER — Encounter: Payer: Self-pay | Admitting: Family Medicine

## 2020-12-16 ENCOUNTER — Other Ambulatory Visit: Payer: Self-pay | Admitting: Family Medicine

## 2020-12-16 VITALS — BP 182/80 | Temp 91.0°F | Ht 72.0 in | Wt 289.0 lb

## 2020-12-16 DIAGNOSIS — Z91199 Patient's noncompliance with other medical treatment and regimen due to unspecified reason: Secondary | ICD-10-CM

## 2020-12-16 DIAGNOSIS — F322 Major depressive disorder, single episode, severe without psychotic features: Secondary | ICD-10-CM

## 2020-12-16 DIAGNOSIS — F411 Generalized anxiety disorder: Secondary | ICD-10-CM | POA: Diagnosis not present

## 2020-12-16 DIAGNOSIS — F4310 Post-traumatic stress disorder, unspecified: Secondary | ICD-10-CM

## 2020-12-16 DIAGNOSIS — Z1159 Encounter for screening for other viral diseases: Secondary | ICD-10-CM

## 2020-12-16 DIAGNOSIS — Z125 Encounter for screening for malignant neoplasm of prostate: Secondary | ICD-10-CM | POA: Diagnosis not present

## 2020-12-16 DIAGNOSIS — I10 Essential (primary) hypertension: Secondary | ICD-10-CM

## 2020-12-16 DIAGNOSIS — Z9119 Patient's noncompliance with other medical treatment and regimen: Secondary | ICD-10-CM

## 2020-12-16 DIAGNOSIS — M4800 Spinal stenosis, site unspecified: Secondary | ICD-10-CM

## 2020-12-16 DIAGNOSIS — Z0001 Encounter for general adult medical examination with abnormal findings: Secondary | ICD-10-CM | POA: Diagnosis not present

## 2020-12-16 LAB — CBC
HCT: 43.6 % (ref 39.0–52.0)
Hemoglobin: 14.8 g/dL (ref 13.0–17.0)
MCHC: 33.9 g/dL (ref 30.0–36.0)
MCV: 92.3 fl (ref 78.0–100.0)
Platelets: 336 10*3/uL (ref 150.0–400.0)
RBC: 4.72 Mil/uL (ref 4.22–5.81)
RDW: 13.7 % (ref 11.5–15.5)
WBC: 6.3 10*3/uL (ref 4.0–10.5)

## 2020-12-16 LAB — COMPREHENSIVE METABOLIC PANEL
ALT: 25 U/L (ref 0–53)
AST: 18 U/L (ref 0–37)
Albumin: 4.3 g/dL (ref 3.5–5.2)
Alkaline Phosphatase: 58 U/L (ref 39–117)
BUN: 15 mg/dL (ref 6–23)
CO2: 26 mEq/L (ref 19–32)
Calcium: 9 mg/dL (ref 8.4–10.5)
Chloride: 106 mEq/L (ref 96–112)
Creatinine, Ser: 0.93 mg/dL (ref 0.40–1.50)
GFR: 96.94 mL/min (ref >60.00)
Glucose, Bld: 125 mg/dL — ABNORMAL HIGH (ref 70–99)
Potassium: 4.2 mEq/L (ref 3.5–5.1)
Sodium: 140 mEq/L (ref 135–145)
Total Bilirubin: 0.6 mg/dL (ref 0.2–1.2)
Total Protein: 7.1 g/dL (ref 6.0–8.3)

## 2020-12-16 LAB — LDL CHOLESTEROL, DIRECT: Direct LDL: 154 mg/dL

## 2020-12-16 LAB — LIPID PANEL
Cholesterol: 264 mg/dL — ABNORMAL HIGH (ref 0–200)
HDL: 33.2 mg/dL — ABNORMAL LOW (ref >39.00)
NonHDL: 231.28
Total CHOL/HDL Ratio: 8
Triglycerides: 296 mg/dL — ABNORMAL HIGH (ref 0.0–149.0)
VLDL: 59.2 mg/dL — ABNORMAL HIGH (ref 0.0–40.0)

## 2020-12-16 LAB — PSA: PSA: 1.09 ng/mL (ref 0.10–4.00)

## 2020-12-16 MED ORDER — ROSUVASTATIN CALCIUM 20 MG PO TABS
20.0000 mg | ORAL_TABLET | Freq: Every day | ORAL | 3 refills | Status: DC
Start: 2020-12-16 — End: 2021-12-21

## 2020-12-16 MED ORDER — AMLODIPINE BESYLATE 5 MG PO TABS: 5.0000 mg | ORAL_TABLET | Freq: Every day | ORAL | 3 refills | Status: DC

## 2020-12-16 MED ORDER — SERTRALINE HCL 50 MG PO TABS: 50.0000 mg | ORAL_TABLET | Freq: Every day | ORAL | 3 refills | Status: DC
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
## 2020-12-16 NOTE — Patient Instructions (Signed)
Give [place] 2-3 business days to get the results of your labs back.   Keep the diet clean and stay active.  [facility] [address] [place], [place] [address] [phone]  [facility] [address] [place], [place] [address] [phone]  [facility] [address] [place], [place] [address] [phone]  [facility] [address], [place], [place], #[phone] [address], [place], [place], #[phone]  [facility] [address] [place], [place] [address] [phone]  [facility] [address] [place], [place] [phone]  [facility] [address] [place], [place] [phone]  Call one of these offices sooner than later as it can take 2-3 months to get a new patient appointment.

## 2020-12-17 ENCOUNTER — Other Ambulatory Visit (INDEPENDENT_AMBULATORY_CARE_PROVIDER_SITE_OTHER): Payer: BC Managed Care – PPO

## 2020-12-17 DIAGNOSIS — R739 Hyperglycemia, unspecified: Secondary | ICD-10-CM

## 2020-12-17 LAB — HEMOGLOBIN A1C: Hgb A1c MFr Bld: 6 % (ref 4.6–6.5)

## 2020-12-17 LAB — HEPATITIS C ANTIBODY
Hepatitis C Ab: NONREACTIVE
SIGNAL TO CUT-OFF: 0.01 (ref <1.00)

## 2020-12-23 ENCOUNTER — Ambulatory Visit: Payer: BC Managed Care – PPO | Admitting: Family Medicine

## 2020-12-23 ENCOUNTER — Encounter: Payer: Self-pay | Admitting: Family Medicine

## 2020-12-23 ENCOUNTER — Other Ambulatory Visit: Payer: Self-pay

## 2020-12-23 VITALS — BP 152/86 | HR 94 | Temp 98.1°F | Ht 72.0 in | Wt 287.2 lb

## 2020-12-23 DIAGNOSIS — I10 Essential (primary) hypertension: Secondary | ICD-10-CM

## 2020-12-23 DIAGNOSIS — F322 Major depressive disorder, single episode, severe without psychotic features: Secondary | ICD-10-CM | POA: Diagnosis not present

## 2020-12-23 DIAGNOSIS — F411 Generalized anxiety disorder: Secondary | ICD-10-CM | POA: Diagnosis not present

## 2020-12-23 DIAGNOSIS — E782 Mixed hyperlipidemia: Secondary | ICD-10-CM | POA: Diagnosis not present

## 2020-12-23 MED ORDER — OLMESARTAN MEDOXOMIL 20 MG PO TABS: 20.0000 mg | ORAL_TABLET | Freq: Every day | ORAL | 3 refills | Status: DC

## 2020-12-23 NOTE — Progress Notes (Signed)
Chief Complaint  Patient presents with  . Follow-up    Subjective Aaron [name] is a 50 y.o. male who presents for hypertension follow up. He does not monitor home blood pressures. He is compliant with medication-Norvasc 5 mg/d. Patient has these side effects of medication: none He is sometimes adhering to a healthy diet overall. Current exercise: none No Cp or SOB.   Hyperlipidemia Patient presents for dyslipidemia follow up. Currently being treated with Crestor 20 mg/d and compliance with treatment thus far has been good. He denies myalgias.n  Diet/exercise as above.  The patient is not known to have coexisting coronary artery disease.  Depression/Anxiety Restarted on Zoloft 50 mg/d. Currently taking 1/2 tab.  Reports compliance, no AE's.  No HI/SI. Psychiatry referral pending.  No self medication. He is not following w a counselor or psychologist.    Past Medical History:  Diagnosis Date  . Anxiety   . Depression   . GERD (gastroesophageal reflux disease)   . Heart murmur   . History of chicken pox   . Hyperlipidemia   . Hypertension   . PTSD (post-traumatic stress disorder)   . Sleep apnea   . Tinnitus of both ears     Exam BP (!) 152/86 (BP Location: Left Arm, Patient Position: Sitting, Cuff Size: Large)   Pulse 94   Temp 98.1 F (36.7 C) (Oral)   Ht 6' (1.829 m)   Wt 287 lb 4 oz (130.3 kg)   SpO2 96%   BMI 38.96 kg/m  General:  well developed, well nourished, in no apparent distress Heart: RRR, no bruits, no LE edema Lungs: clear to auscultation, no accessory muscle use Psych: well oriented with normal range of affect and appropriate judgment/insight  Essential hypertension  Mixed hyperlipidemia  Depression, major, single episode, severe (HCC)  GAD (generalized anxiety disorder)  Morbid obesity (HCC)  1. Cont Norvasc 5 mg/d, add Benicar 20 mg/d. Monitor BP at home. Counseled on diet and exercise. 2. Cont Crestor 20 mg/d. 3/4. Cont Zoloft  as ordered. Encouraged him to reach out to psychiatry self-referral resources and let me know in a week if he doesn't hear anything regarding his psych referral from [place].  F/u in 1 mo. The patient voiced understanding and agreement to the plan.  [name] [place], DO 12/23/20  7:44 AM

## 2020-12-23 NOTE — Patient Instructions (Signed)
Keep the diet clean and stay active.  Check your blood pressures 2-3 times per week, alternating the time of day you check it. If it is high, considering waiting 1-2 minutes and rechecking. If it gets higher, your anxiety is likely creeping up and we should avoid rechecking.   Let us know if you need anything.  EXERCISES  RANGE OF MOTION (ROM) AND STRETCHING EXERCISES - Low Back Pain Most people with lower back pain will find that their symptoms get worse with excessive bending forward (flexion) or arching at the lower back (extension). The exercises that will help resolve your symptoms will focus on the opposite motion.  If you have pain, numbness or tingling which travels down into your buttocks, leg or foot, the goal of the therapy is for these symptoms to move closer to your back and eventually resolve. Sometimes, these leg symptoms will get better, but your lower back pain may worsen. This is often an indication of progress in your rehabilitation. Be very alert to any changes in your symptoms and the activities in which you participated in the 24 hours prior to the change. Sharing this information with your caregiver will allow him or her to most efficiently treat your condition. These exercises may help you when beginning to rehabilitate your injury. Your symptoms may resolve with or without further involvement from your physician, physical therapist or athletic trainer. While completing these exercises, remember:   Restoring tissue flexibility helps normal motion to return to the joints. This allows healthier, less painful movement and activity.  An effective stretch should be held for at least 30 seconds.  A stretch should never be painful. You should only feel a gentle lengthening or release in the stretched tissue. FLEXION RANGE OF MOTION AND STRETCHING EXERCISES:  STRETCH - Flexion, Single Knee to Chest   Lie on a firm bed or floor with both legs extended in front of you.  Keeping  one leg in contact with the floor, bring your opposite knee to your chest. Hold your leg in place by either grabbing behind your thigh or at your knee.  Pull until you feel a gentle stretch in your low back. Hold 30 seconds.  Slowly release your grasp and repeat the exercise with the opposite side. Repeat 2 times. Complete this exercise 3 times per week.   STRETCH - Flexion, Double Knee to Chest  Lie on a firm bed or floor with both legs extended in front of you.  Keeping one leg in contact with the floor, bring your opposite knee to your chest.  Tense your stomach muscles to support your back and then lift your other knee to your chest. Hold your legs in place by either grabbing behind your thighs or at your knees.  Pull both knees toward your chest until you feel a gentle stretch in your low back. Hold 30 seconds.  Tense your stomach muscles and slowly return one leg at a time to the floor. Repeat 2 times. Complete this exercise 3 times per week.   STRETCH - Low Trunk Rotation  Lie on a firm bed or floor. Keeping your legs in front of you, bend your knees so they are both pointed toward the ceiling and your feet are flat on the floor.  Extend your arms out to the side. This will stabilize your upper body by keeping your shoulders in contact with the floor.  Gently and slowly drop both knees together to one side until you feel a gentle stretch  in your low back. Hold for 30 seconds.  Tense your stomach muscles to support your lower back as you bring your knees back to the starting position. Repeat the exercise to the other side. Repeat 2 times. Complete this exercise at least 3 times per week.   EXTENSION RANGE OF MOTION AND FLEXIBILITY EXERCISES:  STRETCH - Extension, Prone on Elbows   Lie on your stomach on the floor, a bed will be too soft. Place your palms about shoulder width apart and at the height of your head.  Place your elbows under your shoulders. If this is too  painful, stack pillows under your chest.  Allow your body to relax so that your hips drop lower and make contact more completely with the floor.  Hold this position for 30 seconds.  Slowly return to lying flat on the floor. Repeat 2 times. Complete this exercise 3 times per week.   RANGE OF MOTION - Extension, Prone Press Ups  Lie on your stomach on the floor, a bed will be too soft. Place your palms about shoulder width apart and at the height of your head.  Keeping your back as relaxed as possible, slowly straighten your elbows while keeping your hips on the floor. You may adjust the placement of your hands to maximize your comfort. As you gain motion, your hands will come more underneath your shoulders.  Hold this position 30 seconds.  Slowly return to lying flat on the floor. Repeat 2 times. Complete this exercise 3 times per week.   RANGE OF MOTION- Quadruped, Neutral Spine   Assume a hands and knees position on a firm surface. Keep your hands under your shoulders and your knees under your hips. You may place padding under your knees for comfort.  Drop your head and point your tailbone toward the ground below you. This will round out your lower back like an angry cat. Hold this position for 30 seconds.  Slowly lift your head and release your tail bone so that your back sags into a large arch, like an old horse.  Hold this position for 30 seconds.  Repeat this until you feel limber in your low back.  Now, find your "sweet spot." This will be the most comfortable position somewhere between the two previous positions. This is your neutral spine. Once you have found this position, tense your stomach muscles to support your low back.  Hold this position for 30 seconds. Repeat 2 times. Complete this exercise 3 times per week.   STRENGTHENING EXERCISES - Low Back Sprain These exercises may help you when beginning to rehabilitate your injury. These exercises should be done near  your "sweet spot." This is the neutral, low-back arch, somewhere between fully rounded and fully arched, that is your least painful position. When performed in this safe range of motion, these exercises can be used for people who have either a flexion or extension based injury. These exercises may resolve your symptoms with or without further involvement from your physician, physical therapist or athletic trainer. While completing these exercises, remember:   Muscles can gain both the endurance and the strength needed for everyday activities through controlled exercises.  Complete these exercises as instructed by your physician, physical therapist or athletic trainer. Increase the resistance and repetitions only as guided.  You may experience muscle soreness or fatigue, but the pain or discomfort you are trying to eliminate should never worsen during these exercises. If this pain does worsen, stop and make certain you  are following the directions exactly. If the pain is still present after adjustments, discontinue the exercise until you can discuss the trouble with your caregiver.  STRENGTHENING - Deep Abdominals, Pelvic Tilt   Lie on a firm bed or floor. Keeping your legs in front of you, bend your knees so they are both pointed toward the ceiling and your feet are flat on the floor.  Tense your lower abdominal muscles to press your low back into the floor. This motion will rotate your pelvis so that your tail bone is scooping upwards rather than pointing at your feet or into the floor. With a gentle tension and even breathing, hold this position for 3 seconds. Repeat 2 times. Complete this exercise 3 times per week.   STRENGTHENING - Abdominals, Crunches   Lie on a firm bed or floor. Keeping your legs in front of you, bend your knees so they are both pointed toward the ceiling and your feet are flat on the floor. Cross your arms over your chest.  Slightly tip your chin down without bending your  neck.  Tense your abdominals and slowly lift your trunk high enough to just clear your shoulder blades. Lifting higher can put excessive stress on the lower back and does not further strengthen your abdominal muscles.  Control your return to the starting position. Repeat 2 times. Complete this exercise 3 times per week.   STRENGTHENING - Quadruped, Opposite UE/LE Lift   Assume a hands and knees position on a firm surface. Keep your hands under your shoulders and your knees under your hips. You may place padding under your knees for comfort.  Find your neutral spine and gently tense your abdominal muscles so that you can maintain this position. Your shoulders and hips should form a rectangle that is parallel with the floor and is not twisted.  Keeping your trunk steady, lift your right hand no higher than your shoulder and then your left leg no higher than your hip. Make sure you are not holding your breath. Hold this position for 30 seconds.  Continuing to keep your abdominal muscles tense and your back steady, slowly return to your starting position. Repeat with the opposite arm and leg. Repeat 2 times. Complete this exercise 3 times per week.   STRENGTHENING - Abdominals and Quadriceps, Straight Leg Raise   Lie on a firm bed or floor with both legs extended in front of you.  Keeping one leg in contact with the floor, bend the other knee so that your foot can rest flat on the floor.  Find your neutral spine, and tense your abdominal muscles to maintain your spinal position throughout the exercise.  Slowly lift your straight leg off the floor about 6 inches for a count of 3, making sure to not hold your breath.  Still keeping your neutral spine, slowly lower your leg all the way to the floor. Repeat this exercise with each leg 2 times. Complete this exercise 3 times per week.  POSTURE AND BODY MECHANICS CONSIDERATIONS - Low Back Sprain Keeping correct posture when sitting, standing or  completing your activities will reduce the stress put on different body tissues, allowing injured tissues a chance to heal and limiting painful experiences. The following are general guidelines for improved posture.  While reading these guidelines, remember:  The exercises prescribed by your provider will help you have the flexibility and strength to maintain correct postures.  The correct posture provides the best environment for your joints to work. All  of your joints have less wear and tear when properly supported by a spine with good posture. This means you will experience a healthier, less painful body.  Correct posture must be practiced with all of your activities, especially prolonged sitting and standing. Correct posture is as important when doing repetitive low-stress activities (typing) as it is when doing a single heavy-load activity (lifting).  RESTING POSITIONS Consider which positions are most painful for you when choosing a resting position. If you have pain with flexion-based activities (sitting, bending, stooping, squatting), choose a position that allows you to rest in a less flexed posture. You would want to avoid curling into a fetal position on your side. If your pain worsens with extension-based activities (prolonged standing, working overhead), avoid resting in an extended position such as sleeping on your stomach. Most people will find more comfort when they rest with their spine in a more neutral position, neither too rounded nor too arched. Lying on a non-sagging bed on your side with a pillow between your knees, or on your back with a pillow under your knees will often provide some relief. Keep in mind, being in any one position for a prolonged period of time, no matter how correct your posture, can still lead to stiffness.  PROPER SITTING POSTURE In order to minimize stress and discomfort on your spine, you must sit with correct posture. Sitting with good posture should be  effortless for a healthy body. Returning to good posture is a gradual process. Many people can work toward this most comfortably by using various supports until they have the flexibility and strength to maintain this posture on their own. When sitting with proper posture, your ears will fall over your shoulders and your shoulders will fall over your hips. You should use the back of the chair to support your upper back. Your lower back will be in a neutral position, just slightly arched. You may place a small pillow or folded towel at the base of your lower back for  support.  When working at a desk, create an environment that supports good, upright posture. Without extra support, muscles tire, which leads to excessive strain on joints and other tissues. Keep these recommendations in mind:  CHAIR:  A chair should be able to slide under your desk when your back makes contact with the back of the chair. This allows you to work closely.  The chair's height should allow your eyes to be level with the upper part of your monitor and your hands to be slightly lower than your elbows.  BODY POSITION  Your feet should make contact with the floor. If this is not possible, use a foot rest.  Keep your ears over your shoulders. This will reduce stress on your neck and low back.  INCORRECT SITTING POSTURES  If you are feeling tired and unable to assume a healthy sitting posture, do not slouch or slump. This puts excessive strain on your back tissues, causing more damage and pain. Healthier options include:  Using more support, like a lumbar pillow.  Switching tasks to something that requires you to be upright or walking.  Talking a brief walk.  Lying down to rest in a neutral-spine position.  PROLONGED STANDING WHILE SLIGHTLY LEANING FORWARD  When completing a task that requires you to lean forward while standing in one place for a long time, place either foot up on a stationary 2-4 inch high object to  help maintain the best posture. When both feet are  on the ground, the lower back tends to lose its slight inward curve. If this curve flattens (or becomes too large), then the back and your other joints will experience too much stress, tire more quickly, and can cause pain.  CORRECT STANDING POSTURES Proper standing posture should be assumed with all daily activities, even if they only take a few moments, like when brushing your teeth. As in sitting, your ears should fall over your shoulders and your shoulders should fall over your hips. You should keep a slight tension in your abdominal muscles to brace your spine. Your tailbone should point down to the ground, not behind your body, resulting in an over-extended swayback posture.   INCORRECT STANDING POSTURES  Common incorrect standing postures include a forward head, locked knees and/or an excessive swayback. WALKING Walk with an upright posture. Your ears, shoulders and hips should all line-up.  PROLONGED ACTIVITY IN A FLEXED POSITION When completing a task that requires you to bend forward at your waist or lean over a low surface, try to find a way to stabilize 3 out of 4 of your limbs. You can place a hand or elbow on your thigh or rest a knee on the surface you are reaching across. This will provide you more stability, so that your muscles do not tire as quickly. By keeping your knees relaxed, or slightly bent, you will also reduce stress across your lower back. CORRECT LIFTING TECHNIQUES  DO :  Assume a wide stance. This will provide you more stability and the opportunity to get as close as possible to the object which you are lifting.  Tense your abdominals to brace your spine. Bend at the knees and hips. Keeping your back locked in a neutral-spine position, lift using your leg muscles. Lift with your legs, keeping your back straight.  Test the weight of unknown objects before attempting to lift them.  Try to keep your elbows locked  down at your sides in order get the best strength from your shoulders when carrying an object.     Always ask for help when lifting heavy or awkward objects. INCORRECT LIFTING TECHNIQUES DO NOT:   Lock your knees when lifting, even if it is a small object.  Bend and twist. Pivot at your feet or move your feet when needing to change directions.  Assume that you can safely pick up even a paperclip without proper posture.

## 2021-01-07 ENCOUNTER — Other Ambulatory Visit: Payer: Self-pay | Admitting: Family Medicine

## 2021-01-25 ENCOUNTER — Telehealth: Payer: Self-pay | Admitting: Family Medicine

## 2021-01-25 ENCOUNTER — Other Ambulatory Visit: Payer: Self-pay

## 2021-01-25 ENCOUNTER — Ambulatory Visit: Payer: BC Managed Care – PPO | Admitting: Family Medicine

## 2021-01-25 ENCOUNTER — Encounter: Payer: Self-pay | Admitting: Family Medicine

## 2021-01-25 VITALS — BP 144/80 | HR 94 | Temp 97.9°F | Ht 72.0 in | Wt 282.1 lb

## 2021-01-25 DIAGNOSIS — R002 Palpitations: Secondary | ICD-10-CM

## 2021-01-25 DIAGNOSIS — I10 Essential (primary) hypertension: Secondary | ICD-10-CM | POA: Diagnosis not present

## 2021-01-25 LAB — COMPREHENSIVE METABOLIC PANEL
ALT: 39 U/L (ref 0–53)
AST: 25 U/L (ref 0–37)
Albumin: 4.6 g/dL (ref 3.5–5.2)
Alkaline Phosphatase: 61 U/L (ref 39–117)
BUN: 10 mg/dL (ref 6–23)
CO2: 25 mEq/L (ref 19–32)
Calcium: 9.4 mg/dL (ref 8.4–10.5)
Chloride: 104 mEq/L (ref 96–112)
Creatinine, Ser: 0.97 mg/dL (ref 0.40–1.50)
GFR: 92.09 mL/min (ref >60.00)
Glucose, Bld: 123 mg/dL — ABNORMAL HIGH (ref 70–99)
Potassium: 4.1 mEq/L (ref 3.5–5.1)
Sodium: 139 mEq/L (ref 135–145)
Total Bilirubin: 0.6 mg/dL (ref 0.2–1.2)
Total Protein: 7.3 g/dL (ref 6.0–8.3)

## 2021-01-25 LAB — CBC
HCT: 45.4 % (ref 39.0–52.0)
Hemoglobin: 15.4 g/dL (ref 13.0–17.0)
MCHC: 33.9 g/dL (ref 30.0–36.0)
MCV: 91.3 fl (ref 78.0–100.0)
Platelets: 352 10*3/uL (ref 150.0–400.0)
RBC: 4.97 Mil/uL (ref 4.22–5.81)
RDW: 13.3 % (ref 11.5–15.5)
WBC: 8.1 10*3/uL (ref 4.0–10.5)

## 2021-01-25 LAB — MAGNESIUM: Magnesium: 2 mg/dL (ref 1.5–2.5)

## 2021-01-25 LAB — TSH: TSH: 1.88 u[IU]/mL (ref 0.35–4.50)

## 2021-01-25 MED ORDER — SERTRALINE HCL 50 MG PO TABS: 50.0000 mg | ORAL_TABLET | Freq: Every day | ORAL | 2 refills | Status: DC

## 2021-01-25 MED ORDER — VALSARTAN 160 MG PO TABS: 160.0000 mg | ORAL_TABLET | Freq: Every day | ORAL | 3 refills | Status: DC

## 2021-01-25 NOTE — Patient Instructions (Signed)
Consider salt substitute rather than salt.   Keep the diet clean and stay active.  Aim to do some physical exertion for 150 minutes per week. This is typically divided into 5 days per week, 30 minutes per day. The activity should be enough to get your heart rate up. Anything is better than nothing if you have time constraints.  Give [place] 2-3 business days to get the results of your labs back.   Check your blood pressures 2-3 times per week, alternating the time of day you check it. If it is high, considering waiting 1-2 minutes and rechecking. If it gets higher, your anxiety is likely creeping up and we should avoid rechecking.   Let us know if you need anything.

## 2021-01-25 NOTE — Telephone Encounter (Signed)
Pt was seen today in office and pt forgot to ask provider that he is having some vision issues, wanted to know if he needs to be referred. Please advise.

## 2021-01-25 NOTE — Telephone Encounter (Signed)
Called left the patient a detailed message of PCP instructions.

## 2021-01-25 NOTE — Progress Notes (Signed)
Chief Complaint  Patient presents with  . Follow-up    Subjective Aaron [name] is a 50 y.o. male who presents for hypertension follow up. He does not monitor home blood pressures. He is compliant with medications-Norvasc 5 mg/d, olmesartan 20 mg/d. Patient has these side effects of medication: Benicar makes him dizzy when he stands He is sometimes adhering to a healthy diet overall. Current exercise: walking No Cp or SOB.    Past Medical History:  Diagnosis Date  . Anxiety   . Depression   . GERD (gastroesophageal reflux disease)   . Heart murmur   . History of chicken pox   . Hyperlipidemia   . Hypertension   . PTSD (post-traumatic stress disorder)   . Sleep apnea   . Tinnitus of both ears     Exam BP (!) 144/80 (BP Location: Left Arm, Patient Position: Sitting, Cuff Size: Large)   Pulse 94   Temp 97.9 F (36.6 C) (Oral)   Ht 6' (1.829 m)   Wt 282 lb 2 oz (128 kg)   SpO2 99%   BMI 38.26 kg/m  General:  well developed, well nourished, in no apparent distress Heart: RRR, no bruits, no LE edema Lungs: clear to auscultation, no accessory muscle use Psych: well oriented with normal range of affect and appropriate judgment/insight  Essential hypertension - Plan: valsartan (DIOVAN) 160 MG tablet  Palpitations - Plan: CBC, Comprehensive metabolic panel, TSH, Magnesium  1.  Chronic, side effect from medication.  Stop Benicar, start valsartan 160 mg daily.  Continue amlodipine 5 mg daily.  Needs to start checking blood pressure at home.  Counseled on diet and exercise. 2.  He did bring up chronic palpitations, will check above labs.  If he still having them at his next visit, we will discuss referral to cardiology for longer-term monitoring. F/u in 1 month. The patient voiced understanding and agreement to the plan.  [name] [place], DO 01/25/21  8:26 AM

## 2021-02-04 ENCOUNTER — Telehealth: Payer: Self-pay | Admitting: *Deleted

## 2021-02-04 NOTE — Telephone Encounter (Signed)
Called and left message on machine that paperwork is ready for pick up.    Form placed at front for pickup.

## 2021-02-24 ENCOUNTER — Encounter: Payer: Self-pay | Admitting: Family Medicine

## 2021-02-24 ENCOUNTER — Other Ambulatory Visit: Payer: Self-pay

## 2021-02-24 ENCOUNTER — Ambulatory Visit: Payer: BC Managed Care – PPO | Admitting: Family Medicine

## 2021-02-24 VITALS — BP 130/72 | HR 91 | Temp 98.0°F | Ht 72.0 in | Wt 284.2 lb

## 2021-02-24 DIAGNOSIS — F4310 Post-traumatic stress disorder, unspecified: Secondary | ICD-10-CM

## 2021-02-24 DIAGNOSIS — F322 Major depressive disorder, single episode, severe without psychotic features: Secondary | ICD-10-CM

## 2021-02-24 DIAGNOSIS — R351 Nocturia: Secondary | ICD-10-CM

## 2021-02-24 DIAGNOSIS — I10 Essential (primary) hypertension: Secondary | ICD-10-CM

## 2021-02-24 MED ORDER — PRAZOSIN HCL 1 MG PO CAPS: 1.0000 mg | ORAL_CAPSULE | Freq: Every day | ORAL | 1 refills | Status: DC

## 2021-02-24 NOTE — Progress Notes (Signed)
Chief Complaint  Patient presents with  . Follow-up    Subjective Aaron [name] is a 50 y.o. male who presents for hypertension follow up. He does not monitor home blood pressures. He is compliant with medications- Norvasc 5 mg/d, valsartan 160 mg/d. Patient has these side effects of medication: none He is adhering to a healthy diet overall. Current exercise: walking No CP or SOB.   Nocturia 3 mo of nocturia that is not improving. Going 2-3 times per night. No constipation. No bleeding, pain, dc. Has not tried anything before.  PTSD Having trouble sleeping. Taking Zoloft 50 mg/d. Reports compliance no AE's. Does not notice a huge difference. Does not want to continue living but has no plans or thoughts of harming himself or others. No self medication. Religion has been a great influence and support for him. He is not following w a Veterinary surgeon. He has an appt w psychiatry on 6/28.    Past Medical History:  Diagnosis Date  . Anxiety   . Depression   . GERD (gastroesophageal reflux disease)   . Heart murmur   . History of chicken pox   . Hyperlipidemia   . Hypertension   . PTSD (post-traumatic stress disorder)   . Sleep apnea   . Tinnitus of both ears     Exam BP 130/72 (BP Location: Left Arm, Patient Position: Sitting, Cuff Size: Normal)   Pulse 91   Temp 98 F (36.7 C) (Oral)   Ht 6' (1.829 m)   Wt 284 lb 4 oz (128.9 kg)   SpO2 97%   BMI 38.55 kg/m  General:  well developed, well nourished, in no apparent distress Heart: RRR, no bruits, no LE edema Lungs: clear to auscultation, no accessory muscle use Abd: BS+, S, NT, mild distension Psych: well oriented with normal range of affect and appropriate judgment/insight  Essential hypertension  Nocturia - Plan: prazosin (MINIPRESS) 1 MG capsule  Depression, major, single episode, severe (HCC)  PTSD (post-traumatic stress disorder) - Plan: prazosin (MINIPRESS) 1 MG capsule  1. Chronic, controlled. Cont Norvasc 5  mg/d, Diovan 160 mg/d. Counseled on diet and exercise. 2. Chronic, uncontrolled. Start Prazosin 1 mg qhs. Warned about orthostasis.  3/4. Prazosin will hopefully hepl w sleep. Has appt w psych later in mo. Cont Zoloft 50 mg/d for now.  F/u in 1 mo to reck #2.  The patient voiced understanding and agreement to the plan.  [name] [place], DO 02/24/21  7:19 AM

## 2021-02-24 NOTE — Patient Instructions (Signed)
Keep the diet clean and stay active.  You don't have to check your blood pressure at home.  Aim to do some physical exertion for 150 minutes per week. This is typically divided into 5 days per week, 30 minutes per day. The activity should be enough to get your heart rate up. Anything is better than nothing if you have time constraints.  Let us know if you need anything.

## 2021-03-16 ENCOUNTER — Ambulatory Visit (INDEPENDENT_AMBULATORY_CARE_PROVIDER_SITE_OTHER): Payer: BC Managed Care – PPO | Admitting: Psychiatry

## 2021-03-16 ENCOUNTER — Other Ambulatory Visit: Payer: Self-pay

## 2021-03-16 ENCOUNTER — Encounter (HOSPITAL_COMMUNITY): Payer: Self-pay | Admitting: Psychiatry

## 2021-03-16 VITALS — BP 171/92 | HR 101 | Ht 72.0 in | Wt 283.0 lb

## 2021-03-16 DIAGNOSIS — F4310 Post-traumatic stress disorder, unspecified: Secondary | ICD-10-CM | POA: Diagnosis not present

## 2021-03-16 DIAGNOSIS — F322 Major depressive disorder, single episode, severe without psychotic features: Secondary | ICD-10-CM | POA: Diagnosis not present

## 2021-03-16 DIAGNOSIS — F411 Generalized anxiety disorder: Secondary | ICD-10-CM

## 2021-03-16 MED ORDER — SERTRALINE HCL 100 MG PO TABS: 100.0000 mg | ORAL_TABLET | Freq: Every day | ORAL | 0 refills | Status: DC

## 2021-03-16 MED ORDER — ARIPIPRAZOLE 5 MG PO TABS
5.0000 mg | ORAL_TABLET | Freq: Every day | ORAL | 1 refills | Status: DC
Start: 2021-03-16 — End: 2021-04-07
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
## 2021-03-23 ENCOUNTER — Other Ambulatory Visit: Payer: Self-pay | Admitting: Family Medicine

## 2021-03-23 DIAGNOSIS — R351 Nocturia: Secondary | ICD-10-CM

## 2021-03-23 DIAGNOSIS — F4310 Post-traumatic stress disorder, unspecified: Secondary | ICD-10-CM

## 2021-03-26 ENCOUNTER — Other Ambulatory Visit: Payer: Self-pay

## 2021-03-26 ENCOUNTER — Encounter: Payer: Self-pay | Admitting: Family Medicine

## 2021-03-26 ENCOUNTER — Ambulatory Visit (INDEPENDENT_AMBULATORY_CARE_PROVIDER_SITE_OTHER): Payer: BC Managed Care – PPO | Admitting: Family Medicine

## 2021-03-26 VITALS — BP 120/78 | HR 83 | Temp 98.3°F | Ht 72.0 in | Wt 284.2 lb

## 2021-03-26 DIAGNOSIS — F322 Major depressive disorder, single episode, severe without psychotic features: Secondary | ICD-10-CM

## 2021-03-26 DIAGNOSIS — F411 Generalized anxiety disorder: Secondary | ICD-10-CM

## 2021-03-26 DIAGNOSIS — R351 Nocturia: Secondary | ICD-10-CM | POA: Diagnosis not present

## 2021-03-26 DIAGNOSIS — R440 Auditory hallucinations: Secondary | ICD-10-CM | POA: Diagnosis not present

## 2021-03-26 DIAGNOSIS — R442 Other hallucinations: Secondary | ICD-10-CM

## 2021-03-26 MED ORDER — PRAZOSIN HCL 2 MG PO CAPS: 2.0000 mg | ORAL_CAPSULE | Freq: Every day | ORAL | 6 refills | Status: DC

## 2021-03-26 MED ORDER — BUSPIRONE HCL 10 MG PO TABS: 10.0000 mg | ORAL_TABLET | Freq: Two times a day (BID) | ORAL | 2 refills | Status: DC

## 2021-03-26 NOTE — Patient Instructions (Signed)
If you have anything you want to run by me, please send me a message.  Keep the diet clean and stay active.  Let us know if you need anything.

## 2021-03-26 NOTE — Progress Notes (Signed)
Chief Complaint  Patient presents with   Follow-up    Subjective: Patient is a 50 y.o. male here for f/u insomnia.  Pt saw psych since last visit. He was referred to therapy, Abilify 5 mg/d was added and his Zoloft was increased from 50 mg/d to 100 mg/d. I saw him around 1 mo ago and started him on prazosin. He is not urinating as much at night after starting the medicine, but still wakes up around 230 AM. No AE's, reports compliance.   Pt reports struggling w his mental health. He has trouble getting through the day and has to force himself to complete tasks and his part-time work. He continues to hear voices speak to him and feel things touching him. Since starting meds from psych, he has not noticed any improvement as of yet despite compliance. No AE's thus far.   Pt also has paperwork regarding disability. It specifically requires a psychology eval/office note. Fortunately, his f/u w psychiatry is next mo and has a therapist appt as well.  Past Medical History:  Diagnosis Date   Anxiety    Depression    GERD (gastroesophageal reflux disease)    Heart murmur    History of chicken pox    Hyperlipidemia    Hypertension    PTSD (post-traumatic stress disorder)    Sleep apnea    Tinnitus of both ears     Objective: BP 120/78   Pulse 83   Temp 98.3 F (36.8 C) (Oral)   Ht 6' (1.829 m)   Wt 284 lb 4 oz (128.9 kg)   SpO2 97%   BMI 38.55 kg/m  General: Awake, appears stated age Lungs: No accessory muscle use Psych: tearful at times, difficulty finding words and remembering things.   Assessment and Plan: Nocturia - Plan: prazosin (MINIPRESS) 2 MG capsule  GAD (generalized anxiety disorder) - Plan: busPIRone (BUSPAR) 10 MG tablet  Depression, major, single episode, severe (HCC)  Auditory hallucinations  Tactile hallucination  Increase Prazosin to 2 mg/d from 1 mg/d.  I will add BuSpar to his regimen. I will reach out to his psych team to ensure they are OK with this. I  did inform him that this is more for anxiety rather than the auditory/tactile hallucinations and dealing with that will mainly be with the psychiatry team. I filled out more paperwork for him as well. I think he will need an evaluation with the psychologist/therapist before they will approve his disability.  While he has daily suicidal thoughts, he denies any intent to go through with this. He is not having any thoughts of harming others.  Fu in 6 mo for CPE or prn as he is in w psych and his BP is controlled.  The patient voiced understanding and agreement to the plan.  I spent 62 minutes with the patient, reviewing his chart, communicating with his psychiatry team and filling out paperwork on the same day of his visit.   [name] Lamont, DO 03/26/21  8:39 AM

## 2021-04-05 ENCOUNTER — Other Ambulatory Visit: Payer: Self-pay | Admitting: Family Medicine

## 2021-04-07 ENCOUNTER — Telehealth (INDEPENDENT_AMBULATORY_CARE_PROVIDER_SITE_OTHER): Payer: BC Managed Care – PPO | Admitting: Psychiatry

## 2021-04-07 ENCOUNTER — Encounter (HOSPITAL_COMMUNITY): Payer: Self-pay | Admitting: Psychiatry

## 2021-04-07 DIAGNOSIS — F4310 Post-traumatic stress disorder, unspecified: Secondary | ICD-10-CM | POA: Diagnosis not present

## 2021-04-07 DIAGNOSIS — F322 Major depressive disorder, single episode, severe without psychotic features: Secondary | ICD-10-CM

## 2021-04-07 DIAGNOSIS — F411 Generalized anxiety disorder: Secondary | ICD-10-CM

## 2021-04-07 MED ORDER — ARIPIPRAZOLE 10 MG PO TABS: 10.0000 mg | ORAL_TABLET | Freq: Every day | ORAL | 0 refills | Status: DC
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
## 2021-04-09 ENCOUNTER — Other Ambulatory Visit: Payer: Self-pay | Admitting: Family Medicine

## 2021-04-09 DIAGNOSIS — G4733 Obstructive sleep apnea (adult) (pediatric): Secondary | ICD-10-CM

## 2021-04-11 ENCOUNTER — Other Ambulatory Visit (HOSPITAL_COMMUNITY): Payer: Self-pay | Admitting: Psychiatry

## 2021-04-15 NOTE — Telephone Encounter (Signed)
Order 30 days at this time given he has upcoming appointment/dosage may be adjusted.

## 2021-04-17 ENCOUNTER — Other Ambulatory Visit: Payer: Self-pay | Admitting: Family Medicine

## 2021-04-17 DIAGNOSIS — F411 Generalized anxiety disorder: Secondary | ICD-10-CM

## 2021-04-21 ENCOUNTER — Other Ambulatory Visit (HOSPITAL_COMMUNITY): Payer: Self-pay | Admitting: *Deleted

## 2021-04-21 DIAGNOSIS — F4310 Post-traumatic stress disorder, unspecified: Secondary | ICD-10-CM

## 2021-04-21 DIAGNOSIS — F322 Major depressive disorder, single episode, severe without psychotic features: Secondary | ICD-10-CM

## 2021-04-21 MED ORDER — ARIPIPRAZOLE 10 MG PO TABS: 10.0000 mg | ORAL_TABLET | Freq: Every day | ORAL | 0 refills | Status: DC

## 2021-04-22 ENCOUNTER — Ambulatory Visit (INDEPENDENT_AMBULATORY_CARE_PROVIDER_SITE_OTHER): Payer: BC Managed Care – PPO | Admitting: Psychiatry

## 2021-04-22 ENCOUNTER — Encounter (HOSPITAL_COMMUNITY): Payer: Self-pay | Admitting: Psychiatry

## 2021-04-22 DIAGNOSIS — F322 Major depressive disorder, single episode, severe without psychotic features: Secondary | ICD-10-CM | POA: Diagnosis not present

## 2021-04-22 DIAGNOSIS — F4310 Post-traumatic stress disorder, unspecified: Secondary | ICD-10-CM | POA: Diagnosis not present

## 2021-04-22 MED ORDER — ARIPIPRAZOLE 15 MG PO TABS: 15.0000 mg | ORAL_TABLET | Freq: Every day | ORAL | 0 refills | Status: DC
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
## 2021-04-29 ENCOUNTER — Ambulatory Visit (INDEPENDENT_AMBULATORY_CARE_PROVIDER_SITE_OTHER): Payer: BC Managed Care – PPO | Admitting: Licensed Clinical Social Worker

## 2021-04-29 DIAGNOSIS — F4310 Post-traumatic stress disorder, unspecified: Secondary | ICD-10-CM

## 2021-04-29 DIAGNOSIS — F322 Major depressive disorder, single episode, severe without psychotic features: Secondary | ICD-10-CM
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
## 2021-05-14 ENCOUNTER — Other Ambulatory Visit (HOSPITAL_COMMUNITY): Payer: Self-pay | Admitting: Psychiatry

## 2021-05-14 DIAGNOSIS — F4310 Post-traumatic stress disorder, unspecified: Secondary | ICD-10-CM

## 2021-05-14 DIAGNOSIS — F322 Major depressive disorder, single episode, severe without psychotic features: Secondary | ICD-10-CM

## 2021-05-18 ENCOUNTER — Telehealth: Payer: Self-pay

## 2021-05-18 ENCOUNTER — Other Ambulatory Visit: Payer: Self-pay | Admitting: Psychiatry

## 2021-05-18 MED ORDER — SERTRALINE HCL 100 MG PO TABS: 100.0000 mg | ORAL_TABLET | Freq: Every day | ORAL | 0 refills | Status: DC

## 2021-05-18 NOTE — Telephone Encounter (Signed)
Ordered

## 2021-05-18 NOTE — Telephone Encounter (Signed)
Medication refill - Fax from pt's [facility] for a refill of his prescribed Sertraline, last provided 04/15/21 and patient is scheduled for 05/27/21 with Dr. Gilmore Laroche.

## 2021-05-27 ENCOUNTER — Ambulatory Visit (INDEPENDENT_AMBULATORY_CARE_PROVIDER_SITE_OTHER): Payer: BC Managed Care – PPO | Admitting: Psychiatry

## 2021-05-27 ENCOUNTER — Encounter (HOSPITAL_COMMUNITY): Payer: Self-pay | Admitting: Psychiatry

## 2021-05-27 DIAGNOSIS — F4310 Post-traumatic stress disorder, unspecified: Secondary | ICD-10-CM | POA: Diagnosis not present

## 2021-05-27 DIAGNOSIS — F322 Major depressive disorder, single episode, severe without psychotic features: Secondary | ICD-10-CM

## 2021-05-27 MED ORDER — ARIPIPRAZOLE 20 MG PO TABS: 20.0000 mg | ORAL_TABLET | Freq: Every day | ORAL | 0 refills | Status: DC
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
## 2021-06-10 ENCOUNTER — Other Ambulatory Visit (HOSPITAL_COMMUNITY): Payer: Self-pay | Admitting: Psychiatry

## 2021-06-10 ENCOUNTER — Ambulatory Visit (INDEPENDENT_AMBULATORY_CARE_PROVIDER_SITE_OTHER): Payer: BC Managed Care – PPO | Admitting: Licensed Clinical Social Worker

## 2021-06-10 DIAGNOSIS — F4310 Post-traumatic stress disorder, unspecified: Secondary | ICD-10-CM | POA: Diagnosis not present

## 2021-06-10 DIAGNOSIS — F322 Major depressive disorder, single episode, severe without psychotic features: Secondary | ICD-10-CM
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
## 2021-06-24 ENCOUNTER — Ambulatory Visit (HOSPITAL_COMMUNITY): Payer: BC Managed Care – PPO | Admitting: Licensed Clinical Social Worker

## 2021-07-08 ENCOUNTER — Ambulatory Visit (INDEPENDENT_AMBULATORY_CARE_PROVIDER_SITE_OTHER): Payer: BC Managed Care – PPO | Admitting: Licensed Clinical Social Worker

## 2021-07-08 DIAGNOSIS — F4310 Post-traumatic stress disorder, unspecified: Secondary | ICD-10-CM

## 2021-07-09 NOTE — Progress Notes (Signed)
   THERAPIST PROGRESS NOTE  Session Time: 10:00 am-10: 40 am  Type of Therapy: Individual Therapy  Purpose of Session: Aaron [name] will manage mood and anxiety as evidenced by regulating physical responses to stressors and trauma, and cope with daily stressors for 5 out of 7 days for 60 days  Interventions: Therapist utilized CBT and Solution Focused brief therapy to address mood and trauma. Therapist provided support and empathy to patient during session. Therapist had patient identify pre-session change. Therapist worked with patient to improve his sleep hygiene. Therapist explored patient's spiritual health, and encouraged him to maintain doing the small things that maintain his faith.   Effectiveness: Patient was oriented x5 (person, place, situation, time, and object). Patient was alert, engaged, pleasant, and cooperative. Patient was casually dressed, and appropriately groomed. Patient noted that things have been difficult but he did notice an improvement in his sleep. 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 Patient understood that he needs to improve his sleep by getting asleep a little earlier and setting a timer on his tv to go off after a short time so it isn't n all night and his brain reacts to it. Patient continue to struggle with voices. He has not been taking his medication and feels exhausted. He is struggling spiritually as well. He has been struggling with his faith with all that he has gone through and the story of Job in the Bible. He has concerns that God allowed all the bad things to happen to Job. Patient understood to continue to work on doing the small things like prayer, and attending church. Patient is going to work on his sleep, and track his sleep.  Patient was engaged in session. Patient responded well to interventions. Patient continues to meet criteria for PTSD. Patient will continue in  outpatient therapy due to being the least restrictive service to meet his needs. Patient made minimal progress on his goals.   Suicidal/Homicidal: Nowithout intent/plan   Plan: Return again in 1-2 weeks.  Diagnosis: Axis I: Post Traumatic Stress Disorder    Axis II: No diagnosis    Bynum Bellows, LCSW 07/09/2021

## 2021-07-13 ENCOUNTER — Encounter (HOSPITAL_COMMUNITY): Payer: Self-pay | Admitting: Psychiatry

## 2021-07-13 ENCOUNTER — Ambulatory Visit (INDEPENDENT_AMBULATORY_CARE_PROVIDER_SITE_OTHER): Payer: BC Managed Care – PPO | Admitting: Psychiatry

## 2021-07-13 DIAGNOSIS — F4310 Post-traumatic stress disorder, unspecified: Secondary | ICD-10-CM | POA: Diagnosis not present

## 2021-07-13 DIAGNOSIS — F322 Major depressive disorder, single episode, severe without psychotic features: Secondary | ICD-10-CM

## 2021-07-13 MED ORDER — ARIPIPRAZOLE 20 MG PO TABS: 20.0000 mg | ORAL_TABLET | Freq: Every day | ORAL | 0 refills | Status: DC
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
## 2021-07-29 ENCOUNTER — Ambulatory Visit (INDEPENDENT_AMBULATORY_CARE_PROVIDER_SITE_OTHER): Payer: BC Managed Care – PPO | Admitting: Licensed Clinical Social Worker

## 2021-07-29 DIAGNOSIS — F4310 Post-traumatic stress disorder, unspecified: Secondary | ICD-10-CM | POA: Diagnosis not present

## 2021-07-29 NOTE — Progress Notes (Signed)
   THERAPIST PROGRESS NOTE  Session Time: 8:00 am-8:45 am  Type of Therapy: Individual Therapy  Purpose of Session: Aaron [name] will manage mood and anxiety as evidenced by regulating physical responses to stressors and trauma, and cope with daily stressors for 5 out of 7 days for 60 days  Interventions: Therapist utilized CBT and Solution Focused brief therapy to address mood and trauma. Therapist provided support and empathy to patient during session. Therapist explored patient's triggers for mood. Therapist processed patient's grief and his understanding of forgiveness.    Effectiveness: Patient was oriented x5 (person, place, situation, time and object). Patient was casually dressed and appropriately groomed. Patient was alert, engaged, depressed, and cooperative. Patient has been struggling. His mood continues to be down. He continues to experience auditory hallucinations/spiritual warfare of voices telling him negative things about himself and cursing at him. Patient has reduced work right now so that he doesn't go over the amount he makes for disability. Patient has extra time on his hands due to this. Patient is struggling with grief over his wife. Their anniversary and the anniversary of her suicide are on the same day and just passed in early Nov. He went to the cemetary and switched out flowers on her headstone. Patient doesn't enjoy the holidays due to missing his wife. Patient understood forgiveness and feels like he struggles to forgive himself about how things went with his wife. Patient was given CBT cards to focus on one daily or on one for a whole week that involve observing his thoughts and challenging those thoughts.   Patient was engaged in session. Patient responded well to interventions. Patient continues to meet criteria for PTSD. Patient will continue in outpatient therapy due to being the least restrictive service to meet his needs. Patient made minimal progress on his goals.    Suicidal/Homicidal: Nowithout intent/plan   Plan: Return again in 1-2 weeks.  Diagnosis: Axis I: Post Traumatic Stress Disorder    Axis II: No diagnosis    Bynum Bellows, LCSW 07/29/2021

## 2021-08-11 ENCOUNTER — Telehealth: Payer: Self-pay | Admitting: *Deleted

## 2021-08-11 NOTE — Telephone Encounter (Signed)
ATC patient x1.  Left message to either bring his SD card and if he does not have one to call us back and give [place] the name of his DME company name.  Will await return call.

## 2021-08-13 ENCOUNTER — Institutional Professional Consult (permissible substitution): Payer: BC Managed Care – PPO | Admitting: Pulmonary Disease

## 2021-08-17 ENCOUNTER — Ambulatory Visit (HOSPITAL_COMMUNITY): Payer: BC Managed Care – PPO | Admitting: Psychiatry

## 2021-08-18 ENCOUNTER — Encounter (HOSPITAL_COMMUNITY): Payer: Self-pay | Admitting: Psychiatry

## 2021-08-18 ENCOUNTER — Telehealth (INDEPENDENT_AMBULATORY_CARE_PROVIDER_SITE_OTHER): Payer: BC Managed Care – PPO | Admitting: Psychiatry

## 2021-08-18 DIAGNOSIS — F4310 Post-traumatic stress disorder, unspecified: Secondary | ICD-10-CM | POA: Diagnosis not present

## 2021-08-18 DIAGNOSIS — F322 Major depressive disorder, single episode, severe without psychotic features: Secondary | ICD-10-CM

## 2021-08-18 MED ORDER — ARIPIPRAZOLE 20 MG PO TABS: 30.0000 mg | ORAL_TABLET | Freq: Every day | ORAL | 1 refills | Status: DC
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
## 2021-08-19 ENCOUNTER — Other Ambulatory Visit: Payer: Self-pay

## 2021-08-19 ENCOUNTER — Ambulatory Visit (INDEPENDENT_AMBULATORY_CARE_PROVIDER_SITE_OTHER): Payer: BC Managed Care – PPO | Admitting: Licensed Clinical Social Worker

## 2021-08-19 DIAGNOSIS — F4310 Post-traumatic stress disorder, unspecified: Secondary | ICD-10-CM | POA: Diagnosis not present

## 2021-08-19 NOTE — Progress Notes (Signed)
   THERAPIST PROGRESS NOTE  Session Time: 8:00 am-8:45 am  Type of Therapy: Individual Therapy  Purpose of Session: Aaron [name] will manage mood and anxiety as evidenced by regulating physical responses to stressors and trauma, and cope with daily stressors for 5 out of 7 days for 60 days  Interventions: Therapist utilized CBT and Solution Focused brief therapy to address mood and trauma. Therapist provided support and empathy to patient. Therapist used exposure therapy of discussing patient's wife's suicide and assisted patient in regulating his body during this discussion.     Effectiveness: Patient was oriented x5 (person, place, situation, time, and object). Patient was casually dressed, and appropriately groomed. Patient is alert, engaged, down, and cooperative. Patient feels about the same. He continues to experience spiritual warfare. Patient shared some anxiety symptoms that are related to but not necessarily indicative of religious OCD such as feeling bad if he doesn't pray prior to driving. Patient shared about the weeks prior to his wife's suicide. He experienced a strong physical reaction and anxious reaction. Patient was brought back to the present moment by focusing on the physical body. Patient will work on managing the physical body responses when experiencing traumatic grief.   Patient was engaged in session. Patient responded well to interventions. Patient continues to meet criteria for PTSD. Patient will continue in outpatient therapy due to being the least restrictive service to meet his needs. Patient made minimal progress on his goals.   Suicidal/Homicidal: Nowithout intent/plan   Plan: Return again in 2-4 weeks.  Diagnosis: Axis I: Post Traumatic Stress Disorder    Axis II: No diagnosis    Bynum Bellows, LCSW 08/19/2021

## 2021-08-25 ENCOUNTER — Encounter: Payer: Self-pay | Admitting: Family Medicine

## 2021-08-25 ENCOUNTER — Ambulatory Visit: Payer: BC Managed Care – PPO | Admitting: Family Medicine

## 2021-08-25 VITALS — BP 164/78 | HR 107 | Temp 98.3°F | Ht 72.0 in | Wt 288.0 lb

## 2021-08-25 DIAGNOSIS — E1165 Type 2 diabetes mellitus with hyperglycemia: Secondary | ICD-10-CM

## 2021-08-25 DIAGNOSIS — R030 Elevated blood-pressure reading, without diagnosis of hypertension: Secondary | ICD-10-CM | POA: Diagnosis not present

## 2021-08-25 DIAGNOSIS — Z23 Encounter for immunization: Secondary | ICD-10-CM

## 2021-08-25 LAB — GLUCOSE, POCT (MANUAL RESULT ENTRY): POC Glucose: 281 mg/dl — AB (ref 70–99)

## 2021-08-25 LAB — MICROALBUMIN / CREATININE URINE RATIO
Creatinine,U: 286.2 mg/dL
Microalb Creat Ratio: 15 mg/g (ref 0.0–30.0)
Microalb, Ur: 42.8 mg/dL — ABNORMAL HIGH (ref 0.0–1.9)

## 2021-08-25 LAB — COMPREHENSIVE METABOLIC PANEL
ALT: 23 U/L (ref 0–53)
AST: 27 U/L (ref 0–37)
Albumin: 4.3 g/dL (ref 3.5–5.2)
Alkaline Phosphatase: 79 U/L (ref 39–117)
BUN: 13 mg/dL (ref 6–23)
CO2: 24 mEq/L (ref 19–32)
Calcium: 9.1 mg/dL (ref 8.4–10.5)
Chloride: 97 mEq/L (ref 96–112)
Creatinine, Ser: 0.99 mg/dL (ref 0.40–1.50)
GFR: 89.5 mL/min (ref >60.00)
Glucose, Bld: 269 mg/dL — ABNORMAL HIGH (ref 70–99)
Potassium: 3.4 mEq/L — ABNORMAL LOW (ref 3.5–5.1)
Sodium: 132 mEq/L — ABNORMAL LOW (ref 135–145)
Total Bilirubin: 0.6 mg/dL (ref 0.2–1.2)
Total Protein: 7.4 g/dL (ref 6.0–8.3)

## 2021-08-25 LAB — HEMOGLOBIN A1C: Hgb A1c MFr Bld: 8.5 % — ABNORMAL HIGH (ref 4.6–6.5)

## 2021-08-25 MED ORDER — METFORMIN HCL 500 MG PO TABS: ORAL_TABLET | ORAL | 1 refills | Status: DC

## 2021-08-25 NOTE — Progress Notes (Signed)
Chief Complaint  Patient presents with   Hyperglycemia   Blurred Vision    Checked BS last night around 5 PM and was 587    Subjective: Patient is a 50 y.o. male here for high sugars.  Patient was having blurred vision yesterday and checked his sugar at his mother's house.  It was 587.  Shortly after it was 562.  He has a history of prediabetes but no known history of diabetes.  Diet and exercise have remained unchanged.  He is urinating and drinking more frequently.  He has not lost weight.  There is a family history of diabetes.  He is on a statin in addition to an ARB.  He has never had a pneumonia vaccine.  He does not currently follow with an ophthalmologist.  Of note, he did not take his blood pressure medication today.  Past Medical History:  Diagnosis Date   Anxiety    Depression    GERD (gastroesophageal reflux disease)    Heart murmur    History of chicken pox    Hyperlipidemia    Hypertension    PTSD (post-traumatic stress disorder)    Sleep apnea    Tinnitus of both ears     Objective: BP (!) 164/78   Pulse (!) 107   Temp 98.3 F (36.8 C) (Oral)   Ht 6' (1.829 m)   Wt 288 lb (130.6 kg)   SpO2 98%   BMI 39.06 kg/m  General: Awake, appears stated age Heart: RRR, no LE edema Lungs: CTAB, no rales, wheezes or rhonchi. No accessory muscle use Neuro: Sensation intact to pinprick over bilateral feet Skin: No external lesions noted over either foot Psych: Age appropriate judgment and insight, normal affect and mood  Assessment and Plan: Type 2 diabetes mellitus with hyperglycemia, without long-term current use of insulin (HCC) - Plan: Comprehensive metabolic panel, Hemoglobin A1c, Microalbumin / creatinine urine ratio, Anti-islet cell antibody, metFORMIN (GLUCOPHAGE) 500 MG tablet, Ambulatory referral to Ophthalmology, POCT Glucose (CBG)  Elevated blood pressure reading  Need for pneumococcal vaccination - Plan: Pneumococcal conjugate vaccine 20-valent (Prevnar  20)  New chronic and uncontrolled issue.  POCT glucose today is 287.  That plus symptoms is diagnostic for diabetes.  We will start metformin 500 mg daily and increase by 500 mg until taking 1000 mg twice daily.  Continue statin, refer to ophthalmology, check urine and A1c in addition to the above labs.  PCV 20 today.  I will see him in 4 weeks.  He will contact his insurance company and see what glucose meter they will cover.  He was subsequently let us know and we will send it in.  We will The patient voiced understanding and agreement to the plan.  [name] [place], DO 08/25/21  11:37 AM

## 2021-08-25 NOTE — Patient Instructions (Addendum)
Give [place] 2-3 business days to get the results of your labs back.   Call your insurance company and see what glucose meter they cover. Let me know and we will send it in.  Check your sugars around 2-3 times per week. Alternate checking in the morning before you eat, in the afternoon and before bed. Write them down and bring it to your next appointment.   Keep the diet clean and stay active.  Let us know if you need anything.

## 2021-08-26 ENCOUNTER — Other Ambulatory Visit: Payer: Self-pay | Admitting: Family Medicine

## 2021-08-26 LAB — ANTI-ISLET CELL ANTIBODY: Islet Cell Ab: NEGATIVE

## 2021-08-26 MED ORDER — POTASSIUM CHLORIDE ER 10 MEQ PO TBCR: 10.0000 meq | EXTENDED_RELEASE_TABLET | Freq: Every day | ORAL | 0 refills | Status: DC

## 2021-08-27 ENCOUNTER — Other Ambulatory Visit: Payer: Self-pay | Admitting: Family Medicine

## 2021-08-27 DIAGNOSIS — E876 Hypokalemia: Secondary | ICD-10-CM

## 2021-08-30 ENCOUNTER — Encounter: Payer: Self-pay | Admitting: Family Medicine

## 2021-08-30 DIAGNOSIS — E1165 Type 2 diabetes mellitus with hyperglycemia: Secondary | ICD-10-CM | POA: Diagnosis not present

## 2021-08-30 DIAGNOSIS — H35033 Hypertensive retinopathy, bilateral: Secondary | ICD-10-CM | POA: Diagnosis not present

## 2021-08-30 LAB — HM DIABETES EYE EXAM

## 2021-08-31 ENCOUNTER — Other Ambulatory Visit (INDEPENDENT_AMBULATORY_CARE_PROVIDER_SITE_OTHER): Payer: BC Managed Care – PPO

## 2021-08-31 DIAGNOSIS — E876 Hypokalemia: Secondary | ICD-10-CM

## 2021-08-31 LAB — BASIC METABOLIC PANEL
BUN: 15 mg/dL (ref 6–23)
CO2: 20 mEq/L (ref 19–32)
Calcium: 9.2 mg/dL (ref 8.4–10.5)
Chloride: 102 mEq/L (ref 96–112)
Creatinine, Ser: 1.02 mg/dL (ref 0.40–1.50)
GFR: 86.34 mL/min (ref >60.00)
Glucose, Bld: 311 mg/dL — ABNORMAL HIGH (ref 70–99)
Potassium: 4.2 mEq/L (ref 3.5–5.1)
Sodium: 134 mEq/L — ABNORMAL LOW (ref 135–145)

## 2021-09-14 ENCOUNTER — Telehealth: Payer: Self-pay | Admitting: Family Medicine

## 2021-09-14 DIAGNOSIS — G4733 Obstructive sleep apnea (adult) (pediatric): Secondary | ICD-10-CM | POA: Diagnosis not present

## 2021-09-14 DIAGNOSIS — Z87891 Personal history of nicotine dependence: Secondary | ICD-10-CM | POA: Diagnosis not present

## 2021-09-14 DIAGNOSIS — I10 Essential (primary) hypertension: Secondary | ICD-10-CM | POA: Diagnosis not present

## 2021-09-14 DIAGNOSIS — E1110 Type 2 diabetes mellitus with ketoacidosis without coma: Secondary | ICD-10-CM | POA: Diagnosis not present

## 2021-09-14 DIAGNOSIS — R000 Tachycardia, unspecified: Secondary | ICD-10-CM | POA: Diagnosis not present

## 2021-09-14 DIAGNOSIS — Z79899 Other long term (current) drug therapy: Secondary | ICD-10-CM | POA: Diagnosis not present

## 2021-09-14 DIAGNOSIS — R079 Chest pain, unspecified: Secondary | ICD-10-CM | POA: Diagnosis not present

## 2021-09-14 DIAGNOSIS — R531 Weakness: Secondary | ICD-10-CM | POA: Diagnosis not present

## 2021-09-14 DIAGNOSIS — R112 Nausea with vomiting, unspecified: Secondary | ICD-10-CM | POA: Diagnosis not present

## 2021-09-14 DIAGNOSIS — E782 Mixed hyperlipidemia: Secondary | ICD-10-CM | POA: Diagnosis not present

## 2021-09-14 DIAGNOSIS — F419 Anxiety disorder, unspecified: Secondary | ICD-10-CM | POA: Diagnosis not present

## 2021-09-14 DIAGNOSIS — Z20822 Contact with and (suspected) exposure to covid-19: Secondary | ICD-10-CM | POA: Diagnosis not present

## 2021-09-14 DIAGNOSIS — F4310 Post-traumatic stress disorder, unspecified: Secondary | ICD-10-CM | POA: Diagnosis not present

## 2021-09-14 DIAGNOSIS — K219 Gastro-esophageal reflux disease without esophagitis: Secondary | ICD-10-CM | POA: Diagnosis not present

## 2021-09-14 DIAGNOSIS — Z7984 Long term (current) use of oral hypoglycemic drugs: Secondary | ICD-10-CM | POA: Diagnosis not present

## 2021-09-14 DIAGNOSIS — E1310 Other specified diabetes mellitus with ketoacidosis without coma: Secondary | ICD-10-CM | POA: Diagnosis not present

## 2021-09-14 NOTE — Telephone Encounter (Signed)
Pt called in and stated his sugar has been reading in the 300's, he hasnt been able to keep anything down and he is having blurred vision. Transferred to triage for further advice.

## 2021-09-15 DIAGNOSIS — R531 Weakness: Secondary | ICD-10-CM | POA: Diagnosis not present

## 2021-09-15 DIAGNOSIS — R079 Chest pain, unspecified: Secondary | ICD-10-CM | POA: Diagnosis not present

## 2021-09-15 DIAGNOSIS — G4733 Obstructive sleep apnea (adult) (pediatric): Secondary | ICD-10-CM | POA: Diagnosis not present

## 2021-09-15 DIAGNOSIS — I10 Essential (primary) hypertension: Secondary | ICD-10-CM | POA: Diagnosis not present

## 2021-09-15 DIAGNOSIS — Z7984 Long term (current) use of oral hypoglycemic drugs: Secondary | ICD-10-CM | POA: Diagnosis not present

## 2021-09-15 DIAGNOSIS — E782 Mixed hyperlipidemia: Secondary | ICD-10-CM | POA: Diagnosis not present

## 2021-09-15 DIAGNOSIS — K219 Gastro-esophageal reflux disease without esophagitis: Secondary | ICD-10-CM | POA: Diagnosis not present

## 2021-09-15 DIAGNOSIS — Z87891 Personal history of nicotine dependence: Secondary | ICD-10-CM | POA: Diagnosis not present

## 2021-09-15 DIAGNOSIS — R112 Nausea with vomiting, unspecified: Secondary | ICD-10-CM | POA: Diagnosis not present

## 2021-09-15 DIAGNOSIS — Z20822 Contact with and (suspected) exposure to covid-19: Secondary | ICD-10-CM | POA: Diagnosis not present

## 2021-09-15 DIAGNOSIS — R000 Tachycardia, unspecified: Secondary | ICD-10-CM | POA: Diagnosis not present

## 2021-09-15 DIAGNOSIS — E1110 Type 2 diabetes mellitus with ketoacidosis without coma: Secondary | ICD-10-CM | POA: Diagnosis not present

## 2021-09-15 DIAGNOSIS — Z79899 Other long term (current) drug therapy: Secondary | ICD-10-CM | POA: Diagnosis not present

## 2021-09-15 DIAGNOSIS — F4310 Post-traumatic stress disorder, unspecified: Secondary | ICD-10-CM | POA: Diagnosis not present

## 2021-09-15 DIAGNOSIS — E1310 Other specified diabetes mellitus with ketoacidosis without coma: Secondary | ICD-10-CM | POA: Diagnosis not present

## 2021-09-15 DIAGNOSIS — F419 Anxiety disorder, unspecified: Secondary | ICD-10-CM | POA: Diagnosis not present

## 2021-09-15 NOTE — Telephone Encounter (Signed)
urse Assessment Nurse: Files, RN, Dustin Date/Time (Eastern Time): 09/14/2021 4:58:25 PM Confirm and document reason for call. If symptomatic, describe symptoms. ---Caller states yesterday bs was in mid 400, today its in the 380, he feels like he has been sick, vomited Christmas day, vomited today, vision little blurry, chest is also hurting to left arm. feels weak, no energy. Angina in the past Does the patient have any new or worsening symptoms? ---Yes Will a triage be completed? ---Yes Related visit to physician within the last 2 weeks? ---Yes Does the PT have any chronic conditions? (i.e. diabetes, asthma, this includes High risk factors for pregnancy, etc.) ---Yes List chronic conditions. ---DM, HTN Is this a behavioral health or substance abuse call? ---No Guidelines Guideline Title Affirmed Question Affirmed Notes Nurse Date/Time (Eastern Time) Diabetes - High Blood Sugar [1] Vomiting AND [2] signs of dehydration (e.g., very dry mouth, lightheaded, dark urine) Files, RN, Amalia Hailey 09/14/2021 5:01:11 PM PLEASE NOTE: All timestamps contained within this report are represented as [place] Standard Time. CONFIDENTIALTY NOTICE: This fax transmission is intended only for the addressee. It contains information that is legally privileged, confidential or otherwise protected from use or disclosure. If you are not the intended recipient, you are strictly prohibited from reviewing, disclosing, copying using or disseminating any of this information or taking any action in reliance on or regarding this information. If you have received this fax in error, please notify us immediately by telephone so that we can arrange for its return to [place]. Phone: [phone], Toll-Free: [phone], Fax: [phone] Page: 2 of 2 Call Id: [id number] Disp. Time Lamount Cohen Time) Disposition Final User 09/14/2021 5:03:07 PM Go to ED Now Yes Files, RN, Amalia Hailey Caller Disagree/Comply Comply Caller  Understands Yes PreDisposition InappropriateToAsk Care Advice Given Per Guideline GO TO ED NOW: * Go to the ED at ___________ Hospital. * Leave now. Drive carefully. * You need to be seen in the Emergency Department. CARE ADVICE given per Diabetes - High Blood Sugar (Adult) guideline

## 2021-09-16 ENCOUNTER — Other Ambulatory Visit: Payer: Self-pay | Admitting: Family Medicine

## 2021-09-16 DIAGNOSIS — E1310 Other specified diabetes mellitus with ketoacidosis without coma: Secondary | ICD-10-CM | POA: Diagnosis not present

## 2021-09-16 DIAGNOSIS — Z87891 Personal history of nicotine dependence: Secondary | ICD-10-CM | POA: Diagnosis not present

## 2021-09-16 DIAGNOSIS — K219 Gastro-esophageal reflux disease without esophagitis: Secondary | ICD-10-CM | POA: Diagnosis not present

## 2021-09-16 DIAGNOSIS — Z20822 Contact with and (suspected) exposure to covid-19: Secondary | ICD-10-CM | POA: Diagnosis not present

## 2021-09-16 DIAGNOSIS — F419 Anxiety disorder, unspecified: Secondary | ICD-10-CM | POA: Diagnosis not present

## 2021-09-16 DIAGNOSIS — R531 Weakness: Secondary | ICD-10-CM | POA: Diagnosis not present

## 2021-09-16 DIAGNOSIS — R079 Chest pain, unspecified: Secondary | ICD-10-CM | POA: Diagnosis not present

## 2021-09-16 DIAGNOSIS — R112 Nausea with vomiting, unspecified: Secondary | ICD-10-CM | POA: Diagnosis not present

## 2021-09-16 DIAGNOSIS — Z79899 Other long term (current) drug therapy: Secondary | ICD-10-CM | POA: Diagnosis not present

## 2021-09-16 DIAGNOSIS — E782 Mixed hyperlipidemia: Secondary | ICD-10-CM | POA: Diagnosis not present

## 2021-09-16 DIAGNOSIS — E1165 Type 2 diabetes mellitus with hyperglycemia: Secondary | ICD-10-CM | POA: Diagnosis not present

## 2021-09-16 DIAGNOSIS — G4733 Obstructive sleep apnea (adult) (pediatric): Secondary | ICD-10-CM | POA: Diagnosis not present

## 2021-09-16 DIAGNOSIS — F4310 Post-traumatic stress disorder, unspecified: Secondary | ICD-10-CM | POA: Diagnosis not present

## 2021-09-16 DIAGNOSIS — I10 Essential (primary) hypertension: Secondary | ICD-10-CM | POA: Diagnosis not present

## 2021-09-16 DIAGNOSIS — Z7984 Long term (current) use of oral hypoglycemic drugs: Secondary | ICD-10-CM | POA: Diagnosis not present

## 2021-09-20 ENCOUNTER — Other Ambulatory Visit: Payer: Self-pay | Admitting: Family Medicine

## 2021-09-20 DIAGNOSIS — I10 Essential (primary) hypertension: Secondary | ICD-10-CM

## 2021-09-22 ENCOUNTER — Other Ambulatory Visit: Payer: Self-pay | Admitting: Family Medicine

## 2021-09-22 ENCOUNTER — Encounter: Payer: Self-pay | Admitting: Family Medicine

## 2021-09-22 ENCOUNTER — Ambulatory Visit: Payer: BC Managed Care – PPO | Admitting: Family Medicine

## 2021-09-22 VITALS — BP 144/80 | HR 69 | Temp 98.4°F | Ht 72.0 in | Wt 281.0 lb

## 2021-09-22 DIAGNOSIS — R030 Elevated blood-pressure reading, without diagnosis of hypertension: Secondary | ICD-10-CM | POA: Diagnosis not present

## 2021-09-22 DIAGNOSIS — E1165 Type 2 diabetes mellitus with hyperglycemia: Secondary | ICD-10-CM

## 2021-09-22 DIAGNOSIS — Z794 Long term (current) use of insulin: Secondary | ICD-10-CM | POA: Diagnosis not present

## 2021-09-22 LAB — BASIC METABOLIC PANEL
BUN: 9 mg/dL (ref 6–23)
CO2: 25 mEq/L (ref 19–32)
Calcium: 8.8 mg/dL (ref 8.4–10.5)
Chloride: 101 mEq/L (ref 96–112)
Creatinine, Ser: 0.82 mg/dL (ref 0.40–1.50)
GFR: 103.15 mL/min (ref >60.00)
Glucose, Bld: 304 mg/dL — ABNORMAL HIGH (ref 70–99)
Potassium: 4.1 mEq/L (ref 3.5–5.1)
Sodium: 137 mEq/L (ref 135–145)

## 2021-09-22 LAB — MAGNESIUM: Magnesium: 1.9 mg/dL (ref 1.5–2.5)

## 2021-09-22 MED ORDER — SEMGLEE (YFGN) 100 UNIT/ML ~~LOC~~ SOPN: 20.0000 [IU] | PEN_INJECTOR | Freq: Every day | SUBCUTANEOUS | Status: DC

## 2021-09-22 MED ORDER — OZEMPIC (0.25 OR 0.5 MG/DOSE) 2 MG/1.5ML ~~LOC~~ SOPN: PEN_INJECTOR | SUBCUTANEOUS | 1 refills | Status: DC

## 2021-09-22 MED ORDER — METFORMIN HCL 500 MG PO TABS: ORAL_TABLET | ORAL | 1 refills | Status: DC

## 2021-09-22 NOTE — Patient Instructions (Addendum)
Send me a message with some sugar readings in the next 2 weeks.  Stay on the metformin.  Move your Semglee dosing to nightly.   Keep the diet clean and stay active.  Aim to do some physical exertion for 150 minutes per week. This is typically divided into 5 days per week, 30 minutes per day. The activity should be enough to get your heart rate up. Anything is better than nothing if you have time constraints.  Give [place] 2-3 business days to get the results of your labs back.   Let me know if there are cost issues with the new weekly injection (Ozempic). Don't fill it if that is the case.   Let us know if you need anything.

## 2021-09-22 NOTE — Progress Notes (Signed)
Chief Complaint  Patient presents with   Hospitalization Follow-up    HPI Aaron [name] is a 51 y.o. y.o. male who presents for a transition of care visit.  Pt was discharged from [facility] on 12/29.  Within 48 business hours of discharge nursing contacted pt via telephone to coordinate care and needs.   Had DKA, admitted on 12/27-12/29. Discharged on 15 u of Semglee daily. Still taking metformin 1000 mg bid. Diet is fair. Not really exercising. No Cp or SOB. No hypoglycemia. Sugars running in high 200's, low 300's since d/c. He thought he was going to go on mealtime insulin but that was not sent in. No D/C summary available at time of this visit.   Past Medical History:  Diagnosis Date   Anxiety    Depression    GERD (gastroesophageal reflux disease)    Heart murmur    History of chicken pox    Hyperlipidemia    Hypertension    PTSD (post-traumatic stress disorder)    Sleep apnea    Tinnitus of both ears    Past Surgical History:  Procedure Laterality Date   broken fingers     HERNIA REPAIR     Family History  Problem Relation Age of Onset   Diabetes Mother    Hyperlipidemia Mother    Hypertension Mother    Heart disease Mother    Cancer Father    Hyperlipidemia Father    Hypertension Father    Other Father        kidney removal   Heart disease Father    Kidney disease Father    Cancer Sister    Hypertension Sister    Colon cancer Neg Hx    Esophageal cancer Neg Hx    Rectal cancer Neg Hx    Stomach cancer Neg Hx    Allergies as of 09/22/2021   No Known Allergies      Medication List        Accurate as of September 22, 2021 11:36 AM. If you have any questions, ask your nurse or doctor.          amLODipine 5 MG tablet Commonly known as: NORVASC TAKE 1 TABLET (5 MG TOTAL) BY MOUTH DAILY.   ARIPiprazole 20 MG tablet Commonly known as: ABILIFY Take 1.5 tablets (30 mg total) by mouth daily. Delete prior refills of abilify   BD Pen Needle Nano 2nd Gen  32G X 4 MM Misc Generic drug: Insulin Pen Needle USE WITH INSULIN PEN AS DIRECTED   busPIRone 10 MG tablet Commonly known as: BUSPAR TAKE 1 TABLET BY MOUTH TWICE A DAY   metFORMIN 500 MG tablet Commonly known as: GLUCOPHAGE Week 1: 1 tab daily Week 2: 1 tab twice daily Week 3: 2 tabs in AM, 1 in evening Week 4: 2 tabs twice daily   OneTouch Delica Plus Lancet33G Misc Apply topically 3 (three) times daily.   OneTouch Verio test strip Generic drug: glucose blood Use to check blood sugar 3 time(s) daily.   Ozempic (0.25 or 0.5 MG/DOSE) 2 MG/1.5ML Sopn Generic drug: Semaglutide(0.25 or 0.5MG /DOS) Inject 0.25 mg into the skin once a week for 28 days, THEN 0.5 mg once a week for 28 days. Start taking on: September 22, 2021 Started by: Sharlene Dory, DO   potassium chloride 10 MEQ tablet Commonly known as: Klor-Con 10 Take 1 tablet (10 mEq total) by mouth daily for 5 days.   rosuvastatin 20 MG tablet Commonly known as: Crestor  Take 1 tablet (20 mg total) by mouth daily.   Semglee (yfgn) 100 UNIT/ML Pen Generic drug: insulin glargine-yfgn Inject 15 Units into the skin daily.   valsartan 160 MG tablet Commonly known as: DIOVAN TAKE 1 TABLET BY MOUTH EVERY DAY        ROS:  Constitutional: No fevers or chills, no weight loss HEENT: No headaches, hearing loss, or runny nose, no sore throat Heart: No chest pain Lungs: No SOB, no cough Abd: No bowel changes, no pain, no N/V GU: No urinary complaints Neuro: No numbness, tingling or weakness Msk: No joint or muscle pain  Objective BP (!) 144/80    Pulse 69    Temp 98.4 F (36.9 C) (Oral)    Ht 6' (1.829 m)    Wt 281 lb (127.5 kg)    SpO2 97%    BMI 38.11 kg/m  General Appearance:  awake, alert, oriented, in no acute distress and well developed, well nourished Skin:  there are no suspicious lesions or rashes of concern Head/face:  NCAT Eyes:  EOMI, PERRLA Ears:  canals and TMs NI Nose/Sinuses:   negative Mouth/Throat:  Mucosa moist, no lesions; pharynx without erythema, edema or exudate. Neck:  neck- supple, no mass, non-tender and no jvd Lungs: Clear to auscultation.  No rales, rhonchi, or wheezing. Normal effort, no accessory muscle use. Heart:  Heart sounds are normal.  Regular rate and rhythm without murmur, gallop or rub. No bruits. Abdomen:  BS+, soft, NT, ND, no masses or organomegaly Musculoskeletal:  No muscle group atrophy or asymmetry, gait normal Neurologic:  Alert and oriented x 3, gait normal., reflexes normal and symmetric, strength and  sensation grossly normal Psych exam: Nml mood and affect, age appropriate judgment and insight  Type 2 diabetes mellitus with hyperglycemia, with long-term current use of insulin (HCC) - Plan: SEMGLEE, YFGN, 100 UNIT/ML Pen, glucose blood (ONETOUCH VERIO) test strip, BD PEN NEEDLE NANO 2ND GEN 32G X 4 MM MISC, Lancets (ONETOUCH DELICA PLUS LANCET33G) MISC, Semaglutide,0.25 or 0.5MG /DOS, (OZEMPIC, 0.25 OR 0.5 MG/DOSE,) 2 MG/1.5ML SOPN, Basic metabolic panel, Magnesium  Elevated blood pressure reading  Labs pending at the time of discharge have been reviewed or are still pending at the time of this visit. Will f/u on electrolytes as above.   Follow-up labs and appointments have been ordered and/or coordinated appropriately.  Cont Semglee 15 u but move to qhs. Cont Metformin 1000 mg bid. Add Ozempic 0.25 mg weekly for 4 doses and then increase to 0.5 mg/week. Counseled on diet/exercise. Send readings in 2 weeks. Follow up in 1 mo. Sample of Ozempic given today.   TRANSITIONAL CARE MANAGEMENT CERTIFICATION:  I certify the following are true:   1. Communication with the patient/care giver was made on 12/30, d/c was 12/29.   2. Complexity of Medical decision making is moderate.  3. Face to face visit occurred within 14 days of discharge.   F/u in 1 mo. The patient voiced understanding and agreement to the plan.  [name] [place],  DO 09/22/21 11:36 AM

## 2021-09-23 ENCOUNTER — Telehealth: Payer: Self-pay | Admitting: Family Medicine

## 2021-09-23 ENCOUNTER — Encounter: Payer: Self-pay | Admitting: Family Medicine

## 2021-09-23 NOTE — Telephone Encounter (Signed)
Patient needs a return to work note/would like to return back to work Friday 09/24/2021

## 2021-09-23 NOTE — Telephone Encounter (Signed)
PCP did ok the letter. Called the patient to inform to pickup at the front desk

## 2021-09-30 ENCOUNTER — Ambulatory Visit (HOSPITAL_COMMUNITY): Payer: BC Managed Care – PPO | Admitting: Psychiatry

## 2021-10-08 ENCOUNTER — Other Ambulatory Visit (HOSPITAL_COMMUNITY): Payer: Self-pay | Admitting: Psychiatry

## 2021-10-08 DIAGNOSIS — F4310 Post-traumatic stress disorder, unspecified: Secondary | ICD-10-CM

## 2021-10-08 DIAGNOSIS — F322 Major depressive disorder, single episode, severe without psychotic features: Secondary | ICD-10-CM

## 2021-10-18 ENCOUNTER — Encounter (HOSPITAL_COMMUNITY): Payer: Self-pay | Admitting: Psychiatry

## 2021-10-18 ENCOUNTER — Telehealth (INDEPENDENT_AMBULATORY_CARE_PROVIDER_SITE_OTHER): Payer: BC Managed Care – PPO | Admitting: Psychiatry

## 2021-10-18 DIAGNOSIS — Z789 Other specified health status: Secondary | ICD-10-CM | POA: Diagnosis not present

## 2021-10-18 DIAGNOSIS — F411 Generalized anxiety disorder: Secondary | ICD-10-CM

## 2021-10-18 DIAGNOSIS — F4310 Post-traumatic stress disorder, unspecified: Secondary | ICD-10-CM

## 2021-10-18 DIAGNOSIS — F1090 Alcohol use, unspecified, uncomplicated: Secondary | ICD-10-CM

## 2021-10-18 DIAGNOSIS — F322 Major depressive disorder, single episode, severe without psychotic features: Secondary | ICD-10-CM | POA: Diagnosis not present
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
## 2021-10-20 DIAGNOSIS — H35033 Hypertensive retinopathy, bilateral: Secondary | ICD-10-CM | POA: Diagnosis not present

## 2021-10-20 DIAGNOSIS — H524 Presbyopia: Secondary | ICD-10-CM | POA: Diagnosis not present

## 2021-10-20 DIAGNOSIS — E1165 Type 2 diabetes mellitus with hyperglycemia: Secondary | ICD-10-CM | POA: Diagnosis not present

## 2021-10-20 DIAGNOSIS — H5213 Myopia, bilateral: Secondary | ICD-10-CM | POA: Diagnosis not present

## 2021-10-26 ENCOUNTER — Ambulatory Visit: Payer: BC Managed Care – PPO | Admitting: Family Medicine

## 2021-10-26 ENCOUNTER — Encounter: Payer: Self-pay | Admitting: Family Medicine

## 2021-10-26 VITALS — BP 124/70 | HR 76 | Temp 97.0°F | Ht 72.0 in | Wt 267.2 lb

## 2021-10-26 DIAGNOSIS — E1165 Type 2 diabetes mellitus with hyperglycemia: Secondary | ICD-10-CM | POA: Diagnosis not present

## 2021-10-26 DIAGNOSIS — Z794 Long term (current) use of insulin: Secondary | ICD-10-CM | POA: Diagnosis not present

## 2021-10-26 MED ORDER — METFORMIN HCL 1000 MG PO TABS: 1000.0000 mg | ORAL_TABLET | Freq: Two times a day (BID) | ORAL | 3 refills | Status: DC

## 2021-10-26 MED ORDER — SEMGLEE (YFGN) 100 UNIT/ML ~~LOC~~ SOPN: 30.0000 [IU] | PEN_INJECTOR | Freq: Every day | SUBCUTANEOUS | Status: DC

## 2021-10-26 NOTE — Progress Notes (Signed)
Subjective:   Chief Complaint  Patient presents with   Follow-up    Aaron [name] is a 51 y.o. male here for follow-up of diabetes.   Aaron [name]'s self monitored glucose range is low 300's.  Patient denies hypoglycemic reactions. He checks his glucose levels 2 time(s) per day. Patient does require insulin.  Semglee 20 u qhs.  Medications include: Metformin 1 g bid, Ozempic 0.5 mg weekly Diet is struggling at times.  A1c in 12/22 was 10.7.  Exercise: active at work No Cp or SOB.   Past Medical History:  Diagnosis Date   Anxiety    Depression    GERD (gastroesophageal reflux disease)    Heart murmur    History of chicken pox    Hyperlipidemia    Hypertension    PTSD (post-traumatic stress disorder)    Sleep apnea    Tinnitus of both ears      Related testing: Retinal exam: Done Pneumovax: done  Objective:  BP 124/70 (BP Location: Left Arm, Cuff Size: Large)    Pulse 76    Temp (!) 97 F (36.1 C) (Oral)    Ht 6' (1.829 m)    Wt 267 lb 4 oz (121.2 kg)    SpO2 96%    BMI 36.25 kg/m  General:  Well developed, well nourished, in no apparent distress Skin:  Warm, no pallor or diaphoresis Lungs:  CTAB, no access msc use Cardio:  RRR, no bruits, no LE edema Psych: Age appropriate judgment and insight  Assessment:   Type 2 diabetes mellitus with hyperglycemia, with long-term current use of insulin (HCC) - Plan: Ambulatory referral to Endocrinology, SEMGLEE, YFGN, 100 UNIT/ML Pen   Plan:   Chronic, not controlled. Refer endo. Increase Semglee to 30 u qhs from 20 u qhs. Monitor sugars, give me some non-fasting readings in addition to reg fasting in 2 weeks and also when his appt is. Counseled on diet and exercise. Cont Metformin 1000 mg bid, Ozempic 0.5 mg weekly if ins covers, he is using samples at this time.  Payment cards for [employer] given. Sample of Basaglar provided.  F/u pending readings and when endo appt is.  The patient voiced understanding and agreement  to the plan.  Aaron [name] [place], DO 10/26/21 7:31 AM

## 2021-10-26 NOTE — Patient Instructions (Signed)
Keep the diet clean and stay active.  Check your sugars around 4-5 times per week. Alternate checking in the morning before you eat, in the afternoon and before bed. Write them down and bring it to your next appointment.   If you do not hear anything about your referral in the next 1-2 weeks, call our office and ask for an update.  Send me a message in 2 weeks with your readings and also when your endocrinology/specialty appointment is.  For now, refill the Ozempic, but do not fill if too expensive.   Call your insurance company and find out what "long-acting insulin" they cover.   Let us know if you need anything.

## 2021-10-27 ENCOUNTER — Ambulatory Visit: Payer: BC Managed Care – PPO | Admitting: Family Medicine

## 2021-11-16 ENCOUNTER — Ambulatory Visit (HOSPITAL_COMMUNITY): Payer: BC Managed Care – PPO | Admitting: Psychiatry

## 2021-11-23 ENCOUNTER — Other Ambulatory Visit: Payer: Self-pay | Admitting: Family Medicine

## 2021-11-25 ENCOUNTER — Encounter (HOSPITAL_COMMUNITY): Payer: Self-pay | Admitting: Psychiatry

## 2021-11-25 ENCOUNTER — Ambulatory Visit (INDEPENDENT_AMBULATORY_CARE_PROVIDER_SITE_OTHER): Payer: BC Managed Care – PPO | Admitting: Psychiatry

## 2021-11-25 DIAGNOSIS — F411 Generalized anxiety disorder: Secondary | ICD-10-CM

## 2021-11-25 DIAGNOSIS — F322 Major depressive disorder, single episode, severe without psychotic features: Secondary | ICD-10-CM | POA: Diagnosis not present

## 2021-11-25 DIAGNOSIS — F4310 Post-traumatic stress disorder, unspecified: Secondary | ICD-10-CM

## 2021-11-25 MED ORDER — HYDROXYZINE PAMOATE 25 MG PO CAPS: 25.0000 mg | ORAL_CAPSULE | Freq: Every day | ORAL | 0 refills | Status: DC | PRN
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
## 2021-12-06 ENCOUNTER — Telehealth: Payer: Self-pay

## 2021-12-06 NOTE — Telephone Encounter (Signed)
PA approved  ? ?Effective from 12/06/2021 through 12/05/2022. ?

## 2021-12-06 NOTE — Telephone Encounter (Signed)
PA initiated via Covermymeds; KEY:  [id number]. Awaiting determination.  ?

## 2021-12-13 ENCOUNTER — Ambulatory Visit (INDEPENDENT_AMBULATORY_CARE_PROVIDER_SITE_OTHER): Payer: BC Managed Care – PPO | Admitting: Licensed Clinical Social Worker

## 2021-12-13 DIAGNOSIS — F4310 Post-traumatic stress disorder, unspecified: Secondary | ICD-10-CM | POA: Diagnosis not present

## 2021-12-13 NOTE — Progress Notes (Signed)
? ?  THERAPIST PROGRESS NOTE ? ?Session Time: 9:00 am-9:45 am ? ?Type of Therapy: Individual Therapy ? ?Purpose of Session: Kerem will manage mood and anxiety as evidenced by regulating physical responses to stressors and trauma, and cope with daily stressors for 5 out of 7 days for 60 days. ? ?ProgressTowards Goals: Progressing ? ?Interventions: Therapist utilized CBT and Solution focused brief therapy to address mood and anxiety. Therapist provided support and empathy to patient during session. Therapist provided psychoeducation on CBT. Therapist worked with patient to identify thoughts that impact his mood and anxiety.  ? ?Effectiveness: Patient was oriented x4 (person, place, situation, and time). Patient was casually dressed, and appropriately groomed. Patient was alert, engaged, anxious, and cooperative. Patient continues to feel like he is going to crawl out of his skin. Patient has difficulty falling asleep. He is fatigued throughout the day. Patient notices tension and gritting his teeth throughout the day. Patient understood CBT. He was able to identify several thoughts such as missing his wife or not knowing what to say during conversations. Patient will track his thoughts.  ? ?Patient was engaged in session. Patient responded well to interventions. Patient continues to meet criteria for PTSD. Patient will continue in outpatient therapy due to being the least restrictive service to meet his needs. Patient made minimal progress on his goals.  ? ?Suicidal/Homicidal: Nowithout intent/plan ? ?Plan: Return again in 2-4 weeks. Patient will pay attention to his thoughts and track his mood.  ? ?Diagnosis: PTSD (post-traumatic stress disorder) ? ?Collaboration of Care: Psychiatrist AEB reviewing documentation and discussing as needed the progress of patient with Dr. Gilmore Laroche ? ?Patient/Guardian was advised Release of Information must be obtained prior to any record release in order to collaborate their care with an  outside provider. Patient/Guardian was advised if they have not already done so to contact the registration department to sign all necessary forms in order for [place] to release information regarding their care.  ? ?Consent: Patient/Guardian gives verbal consent for treatment and assignment of benefits for services provided during this visit. Patient/Guardian expressed understanding and agreed to proceed.  ? ?Bynum Bellows, LCSW ?12/13/2021 ? ?

## 2021-12-17 ENCOUNTER — Other Ambulatory Visit (HOSPITAL_COMMUNITY): Payer: Self-pay | Admitting: Psychiatry

## 2021-12-21 ENCOUNTER — Other Ambulatory Visit: Payer: Self-pay | Admitting: Family Medicine

## 2021-12-22 ENCOUNTER — Ambulatory Visit (INDEPENDENT_AMBULATORY_CARE_PROVIDER_SITE_OTHER): Payer: BC Managed Care – PPO | Admitting: Family Medicine

## 2021-12-22 ENCOUNTER — Encounter: Payer: Self-pay | Admitting: Family Medicine

## 2021-12-22 VITALS — BP 130/81 | HR 88 | Temp 98.2°F | Ht 72.0 in | Wt 258.4 lb

## 2021-12-22 DIAGNOSIS — Z0000 Encounter for general adult medical examination without abnormal findings: Secondary | ICD-10-CM | POA: Diagnosis not present

## 2021-12-22 DIAGNOSIS — E1165 Type 2 diabetes mellitus with hyperglycemia: Secondary | ICD-10-CM | POA: Diagnosis not present

## 2021-12-22 DIAGNOSIS — Z794 Long term (current) use of insulin: Secondary | ICD-10-CM

## 2021-12-22 DIAGNOSIS — Z125 Encounter for screening for malignant neoplasm of prostate: Secondary | ICD-10-CM

## 2021-12-22 DIAGNOSIS — Z6835 Body mass index (BMI) 35.0-35.9, adult: Secondary | ICD-10-CM

## 2021-12-22 DIAGNOSIS — Z136 Encounter for screening for cardiovascular disorders: Secondary | ICD-10-CM | POA: Diagnosis not present

## 2021-12-22 LAB — COMPREHENSIVE METABOLIC PANEL
ALT: 25 U/L (ref 0–53)
AST: 18 U/L (ref 0–37)
Albumin: 4.4 g/dL (ref 3.5–5.2)
Alkaline Phosphatase: 60 U/L (ref 39–117)
BUN: 11 mg/dL (ref 6–23)
CO2: 24 mEq/L (ref 19–32)
Calcium: 8.9 mg/dL (ref 8.4–10.5)
Chloride: 102 mEq/L (ref 96–112)
Creatinine, Ser: 0.82 mg/dL (ref 0.40–1.50)
GFR: 102.96 mL/min (ref >60.00)
Glucose, Bld: 291 mg/dL — ABNORMAL HIGH (ref 70–99)
Potassium: 4.1 mEq/L (ref 3.5–5.1)
Sodium: 135 mEq/L (ref 135–145)
Total Bilirubin: 0.4 mg/dL (ref 0.2–1.2)
Total Protein: 6.7 g/dL (ref 6.0–8.3)

## 2021-12-22 LAB — LIPID PANEL
Cholesterol: 162 mg/dL (ref 0–200)
HDL: 35.5 mg/dL — ABNORMAL LOW (ref >39.00)
NonHDL: 126.13
Total CHOL/HDL Ratio: 5
Triglycerides: 312 mg/dL — ABNORMAL HIGH (ref 0.0–149.0)
VLDL: 62.4 mg/dL — ABNORMAL HIGH (ref 0.0–40.0)

## 2021-12-22 LAB — CBC
HCT: 43.1 % (ref 39.0–52.0)
Hemoglobin: 14.6 g/dL (ref 13.0–17.0)
MCHC: 33.9 g/dL (ref 30.0–36.0)
MCV: 93 fl (ref 78.0–100.0)
Platelets: 315 10*3/uL (ref 150.0–400.0)
RBC: 4.64 Mil/uL (ref 4.22–5.81)
RDW: 13.4 % (ref 11.5–15.5)
WBC: 8.9 10*3/uL (ref 4.0–10.5)

## 2021-12-22 LAB — LDL CHOLESTEROL, DIRECT: Direct LDL: 81 mg/dL

## 2021-12-22 LAB — PSA: PSA: 0.68 ng/mL (ref 0.10–4.00)

## 2021-12-22 LAB — HEMOGLOBIN A1C: Hgb A1c MFr Bld: 12.4 % — ABNORMAL HIGH (ref 4.6–6.5)

## 2021-12-22 MED ORDER — SEMGLEE (YFGN) 100 UNIT/ML ~~LOC~~ SOPN: 35.0000 [IU] | PEN_INJECTOR | Freq: Every day | SUBCUTANEOUS | Status: DC

## 2021-12-22 MED ORDER — BD PEN NEEDLE NANO 2ND GEN 32G X 4 MM MISC: 2 refills | Status: AC

## 2021-12-22 MED ORDER — SEMAGLUTIDE (1 MG/DOSE) 4 MG/3ML ~~LOC~~ SOPN: 1.0000 mg | PEN_INJECTOR | SUBCUTANEOUS | 2 refills | Status: DC

## 2021-12-22 NOTE — Progress Notes (Signed)
Chief Complaint  ?Patient presents with  ? Annual Exam  ? ? ?Well Male ?Aaron [name] is here for a complete physical.   ?His last physical was >1 year ago.  ?Current diet: in general, a "healthy" diet.   ?Current exercise: active a work  ?Weight trend: losing weight; uncontrolled ?Fatigue out of ordinary?  ?Seat belt? Yes.   ?Advanced directive? No ? ?Health maintenance ?Tetanus- Yes ?HIV- Yes ?Hep C- Yes ? ?DM- does not routinely check sugars. Checked recently and was in mid-high 200's. Diet/exercise as above. Takes Semglee 30 u qhs in addition to Metformin 1000 mg bid and Ozempic 0.5 mg weekly.  Reports compliance, no adverse effects.  Last A1c in December was 8.5.  He did get hospitalized for DKA in the spring.  He is set to see endocrinology but not until August.  He is on the waiting list. ? ?Past Medical History:  ?Diagnosis Date  ? Anxiety   ? Depression   ? GERD (gastroesophageal reflux disease)   ? Heart murmur   ? History of chicken pox   ? Hyperlipidemia   ? Hypertension   ? PTSD (post-traumatic stress disorder)   ? Sleep apnea   ? Tinnitus of both ears   ?  ? ?Past Surgical History:  ?Procedure Laterality Date  ? broken fingers    ? HERNIA REPAIR    ? ? ?Medications  ?Current Outpatient Medications on File Prior to Visit  ?Medication Sig Dispense Refill  ? amLODipine (NORVASC) 5 MG tablet TAKE 1 TABLET (5 MG TOTAL) BY MOUTH DAILY. 90 tablet 1  ? ARIPiprazole (ABILIFY) 30 MG tablet Take 1 tablet (30 mg total) by mouth daily. Delete prior refills of abilify 90 tablet 0  ? busPIRone (BUSPAR) 10 MG tablet TAKE 1 TABLET BY MOUTH TWICE A DAY 180 tablet 1  ? glucose blood (ONETOUCH VERIO) test strip Use to check blood sugar 3 time(s) daily.    ? hydrOXYzine (VISTARIL) 25 MG capsule TAKE 1 CAPSULE (25 MG TOTAL) BY MOUTH DAILY AS NEEDED FOR ITCHING. 90 capsule 0  ? Lancets (ONETOUCH DELICA PLUS LANCET33G) MISC Apply topically 3 (three) times daily.    ? metFORMIN (GLUCOPHAGE) 1000 MG tablet Take 1 tablet  (1,000 mg total) by mouth 2 (two) times daily with a meal. 180 tablet 3  ? rosuvastatin (CRESTOR) 20 MG tablet TAKE 1 TABLET BY MOUTH EVERY DAY 90 tablet 3  ? valsartan (DIOVAN) 160 MG tablet TAKE 1 TABLET BY MOUTH EVERY DAY 30 tablet 3  ? potassium chloride (KLOR-CON 10) 10 MEQ tablet Take 1 tablet (10 mEq total) by mouth daily for 5 days. 5 tablet 0  ? [DISCONTINUED] sertraline (ZOLOFT) 100 MG tablet Take 1 tablet (100 mg total) by mouth daily. Dose increased, delete the 50mg  dose 30 tablet 0  ? ? ?Allergies ?No Known Allergies ? ?Family History ?Family History  ?Problem Relation Age of Onset  ? Diabetes Mother   ? Hyperlipidemia Mother   ? Hypertension Mother   ? Heart disease Mother   ? Cancer Father   ? Hyperlipidemia Father   ? Hypertension Father   ? Other Father   ?     kidney removal  ? Heart disease Father   ? Kidney disease Father   ? Cancer Sister   ? Hypertension Sister   ? Colon cancer Neg Hx   ? Esophageal cancer Neg Hx   ? Rectal cancer Neg Hx   ? Stomach cancer Neg Hx   ? ? ?  Review of Systems: ?Constitutional: no fevers or chills ?Eye:  no recent significant change in vision ?Ear/Nose/Mouth/Throat:  Ears:  no hearing loss ?Nose/Mouth/Throat:  no complaints of nasal congestion, no sore throat ?Cardiovascular:  no chest pain ?Respiratory:  no shortness of breath ?Gastrointestinal:  no abdominal pain, no change in bowel habits ?GU:  Male: negative for dysuria, frequency, and incontinence ?Musculoskeletal/Extremities:  no pain of the joints ?Integumentary (Skin/Breast):  no abnormal skin lesions reported ?Neurologic:  no headaches ?Endocrine: No unexpected weight gain ?Hematologic/Lymphatic:  no night sweats ? ?Exam ?BP 130/81   Pulse 88   Temp 98.2 ?F (36.8 ?C) (Oral)   Ht 6' (1.829 m)   Wt 258 lb 6 oz (117.2 kg)   SpO2 97%   BMI 35.04 kg/m?  ?General:  well developed, well nourished, in no apparent distress ?Skin:  no significant moles, warts, or growths ?Head:  no masses, lesions, or  tenderness ?Eyes:  pupils equal and round, sclera anicteric without injection ?Ears:  canals without lesions, TMs shiny without retraction, no obvious effusion, no erythema ?Nose:  nares patent, septum midline, mucosa normal ?Throat/Pharynx:  lips and gingiva without lesion; tongue and uvula midline; non-inflamed pharynx; no exudates or postnasal drainage ?Neck: neck supple without adenopathy, thyromegaly, or masses ?Lungs:  clear to auscultation, breath sounds equal bilaterally, no respiratory distress ?Cardio:  regular rate and rhythm, no bruits, no LE edema ?Abdomen:  abdomen soft, nontender; bowel sounds normal; no masses or organomegaly ?Rectal: Deferred ?Musculoskeletal:  symmetrical muscle groups noted without atrophy or deformity ?Extremities:  no clubbing, cyanosis, or edema, no deformities, no skin discoloration ?Neuro:  gait normal; deep tendon reflexes normal and symmetric ?Psych: well oriented with normal range of affect and appropriate judgment/insight ? ?Assessment and Plan ? ?Well adult exam - Plan: CBC, Comprehensive metabolic panel, Lipid panel ? ?Type 2 diabetes mellitus with hyperglycemia, with long-term current use of insulin (HCC) - Plan: Hemoglobin A1c, BD PEN NEEDLE NANO 2ND GEN 32G X 4 MM MISC, SEMGLEE, YFGN, 100 UNIT/ML Pen ? ?Screening for prostate cancer - Plan: PSA ? ?Class 2 severe obesity due to excess calories with serious comorbidity and body mass index (BMI) of 35.0 to 35.9 in adult [name])  ? ?Well 51 y.o. male. ?Counseled on diet and exercise. ?Diabetes: Chronic, not controlled.  Increase Ozempic from 0.5 mg weekly to 1 mg weekly.  Continue metformin 1000 mg twice daily.  Increase long-acting insulin from 30 units to 35 units nightly.  Every third day, he will increase by 3 units until his sugars are consistently less than 150.  He will send me a message with his readings in 3 weeks and I will see him in 6 weeks.  Keep trying to move up endocrinology appointment.  Once hyperglycemia  improves, can consider adding SGLT2 inhibitor. ?Counseled on risks and benefits of prostate cancer screening with PSA. The patient agrees to undergo screening.  ?Advanced directive form provided today.  ?Other orders as above. ?The patient voiced understanding and agreement to the plan. ? ?Sharlene Dory, DO ?12/22/21 ?7:23 AM ? ?

## 2021-12-22 NOTE — Patient Instructions (Addendum)
Give [place] 2-3 business days to get the results of your labs back.  ? ?Keep the diet clean and stay active. ? ?Every 3rd day, increase your insulin by 3 units until your average morning readings are consistently 140 or less. Send me a message in 3-4 weeks with your readings.  ? ?Please get me a copy of your advanced directive form at your convenience.  ? ?Let us know if you need anything. ?

## 2021-12-24 ENCOUNTER — Other Ambulatory Visit: Payer: Self-pay | Admitting: Family Medicine

## 2021-12-24 DIAGNOSIS — I10 Essential (primary) hypertension: Secondary | ICD-10-CM

## 2022-01-03 ENCOUNTER — Ambulatory Visit (INDEPENDENT_AMBULATORY_CARE_PROVIDER_SITE_OTHER): Payer: BC Managed Care – PPO | Admitting: Family Medicine

## 2022-01-03 ENCOUNTER — Encounter: Payer: Self-pay | Admitting: Family Medicine

## 2022-01-03 VITALS — BP 160/84 | HR 97 | Temp 98.0°F | Resp 16 | Ht 72.0 in | Wt 267.0 lb

## 2022-01-03 DIAGNOSIS — R42 Dizziness and giddiness: Secondary | ICD-10-CM

## 2022-01-03 DIAGNOSIS — R030 Elevated blood-pressure reading, without diagnosis of hypertension: Secondary | ICD-10-CM | POA: Diagnosis not present

## 2022-01-03 DIAGNOSIS — R002 Palpitations: Secondary | ICD-10-CM | POA: Diagnosis not present

## 2022-01-03 NOTE — Progress Notes (Signed)
Chief Complaint  ?Patient presents with  ? Blood Sugar Problem  ?  Here for Diabetes   ? ? ?Aaron [name] is 51 y.o. pt here for dizziness. ? ?Duration: 3 days ?Pass out? No ?Spinning? No ?Recent illness/fever? No ?Headache? No ?Neurologic signs? No ?Change in PO intake? No ?Palpitations? Yes ?Bleeding? No ?Eating does not affect his symptoms. ?He has been checking his blood pressure and levels have not been low. ? ?Past Medical History:  ?Diagnosis Date  ? Anxiety   ? Depression   ? GERD (gastroesophageal reflux disease)   ? Heart murmur   ? History of chicken pox   ? Hyperlipidemia   ? Hypertension   ? PTSD (post-traumatic stress disorder)   ? Sleep apnea   ? Tinnitus of both ears   ? ? ?Family History  ?Problem Relation Age of Onset  ? Diabetes Mother   ? Hyperlipidemia Mother   ? Hypertension Mother   ? Heart disease Mother   ? Cancer Father   ? Hyperlipidemia Father   ? Hypertension Father   ? Other Father   ?     kidney removal  ? Heart disease Father   ? Kidney disease Father   ? Cancer Sister   ? Hypertension Sister   ? Colon cancer Neg Hx   ? Esophageal cancer Neg Hx   ? Rectal cancer Neg Hx   ? Stomach cancer Neg Hx   ? ? ?Allergies as of 01/03/2022   ?No Known Allergies ?  ? ?  ?Medication List  ?  ? ?  ? Accurate as of January 03, 2022  4:59 PM. If you have any questions, ask your nurse or doctor.  ?  ?  ? ?  ? ?amLODipine 5 MG tablet ?Commonly known as: NORVASC ?TAKE 1 TABLET (5 MG TOTAL) BY MOUTH DAILY. ?  ?ARIPiprazole 30 MG tablet ?Commonly known as: ABILIFY ?Take 1 tablet (30 mg total) by mouth daily. Delete prior refills of abilify ?  ?BD Pen Needle Nano 2nd Gen 32G X 4 MM Misc ?Generic drug: Insulin Pen Needle ?USE WITH INSULIN PEN AS DIRECTED ?  ?busPIRone 10 MG tablet ?Commonly known as: BUSPAR ?TAKE 1 TABLET BY MOUTH TWICE A DAY ?  ?hydrOXYzine 25 MG capsule ?Commonly known as: VISTARIL ?TAKE 1 CAPSULE (25 MG TOTAL) BY MOUTH DAILY AS NEEDED FOR ITCHING. ?  ?metFORMIN 1000 MG tablet ?Commonly  known as: GLUCOPHAGE ?Take 1 tablet (1,000 mg total) by mouth 2 (two) times daily with a meal. ?  ?OneTouch Delica Plus Lancet33G Misc ?Apply topically 3 (three) times daily. ?  ?OneTouch Verio test strip ?Generic drug: glucose blood ?Use to check blood sugar 3 time(s) daily. ?  ?potassium chloride 10 MEQ tablet ?Commonly known as: Klor-Con 10 ?Take 1 tablet (10 mEq total) by mouth daily for 5 days. ?  ?rosuvastatin 20 MG tablet ?Commonly known as: CRESTOR ?TAKE 1 TABLET BY MOUTH EVERY DAY ?  ?Semaglutide (1 MG/DOSE) 4 MG/3ML Sopn ?Inject 1 mg as directed once a week. ?  ?Semglee (yfgn) 100 UNIT/ML Pen ?Generic drug: insulin glargine-yfgn ?Inject 35 Units into the skin daily. ?  ?valsartan 160 MG tablet ?Commonly known as: DIOVAN ?TAKE 1 TABLET BY MOUTH EVERY DAY ?  ? ?  ? ? ?BP (!) 160/84 (BP Location: Right Arm, Patient Position: Sitting, Cuff Size: Normal) Comment: rechecked BP 168/89  Pulse 97   Temp 98 ?F (36.7 ?C) (Oral)   Resp 16   Ht 6' (1.829  m)   Wt 267 lb (121.1 kg)   SpO2 97%   BMI 36.21 kg/m?  ?General: Awake, alert, appears stated age ?Eyes: PERRLA, EOMi ?Ears: Patent, TM's neg b/l ?Heart: RRR, no murmurs, no carotid bruits ?Lungs: CTAB, no accessory muscle use ?MSK: 5/5 strength throughout, gait normal ?Neuro: No cerebellar signs, patellar reflex 2/4 b/l wo clonus, calcaneal reflex 1/4 b/l wo clonus, biceps reflex 1/4 b/l wo clonus; Dix-Hall-Pike negative bilaterally. ?Psych: Age appropriate judgment and insight, normal mood and affect ? ?Palpitations - Plan: CBC, Comprehensive metabolic panel, TSH, Magnesium, EKG 12-Lead ? ?Light headed - Plan: EKG 12-Lead ? ?Elevated blood pressure reading ? ?New problem, uncertain prognosis.  EKG unremarkable.  Check labs.  If negative, will refer to cardiology for possibly getting a longer-term monitor. ?EKG shows NSR, normal axis, no interval abnormalities, no ST segment or T wave changes, good R wave progression.  Stay hydrated.  Does not seem like it is  hypoglycemia or hypotension. ?Monitor blood pressure at home, if continually elevated over the next couple weeks, he will schedule another appointment and we will need to adjust his medications. ?F/u as originally scheduled otherwise. ?Pt voiced understanding and agreement to the plan. ? ?Sharlene Dory, DO ?01/03/22 ?4:59 PM ? ?

## 2022-01-03 NOTE — Patient Instructions (Signed)
Give [place] 2-3 business days to get the results of your labs back. If labs are normal, I will set you up with a cardiologist.  ? ?Keep the diet clean and stay active. ? ?Monitor your blood pressure at home. If still elevated over next couple weeks, I want to see you. ? ?Let us know if you need anything. ?

## 2022-01-04 ENCOUNTER — Other Ambulatory Visit: Payer: Self-pay | Admitting: Family Medicine

## 2022-01-04 ENCOUNTER — Ambulatory Visit: Payer: BC Managed Care – PPO | Admitting: Family Medicine

## 2022-01-04 ENCOUNTER — Ambulatory Visit (INDEPENDENT_AMBULATORY_CARE_PROVIDER_SITE_OTHER): Payer: BC Managed Care – PPO | Admitting: Licensed Clinical Social Worker

## 2022-01-04 DIAGNOSIS — R42 Dizziness and giddiness: Secondary | ICD-10-CM

## 2022-01-04 DIAGNOSIS — R002 Palpitations: Secondary | ICD-10-CM

## 2022-01-04 DIAGNOSIS — F4310 Post-traumatic stress disorder, unspecified: Secondary | ICD-10-CM | POA: Diagnosis not present

## 2022-01-04 LAB — COMPREHENSIVE METABOLIC PANEL
ALT: 18 U/L (ref 0–53)
AST: 15 U/L (ref 0–37)
Albumin: 4.6 g/dL (ref 3.5–5.2)
Alkaline Phosphatase: 53 U/L (ref 39–117)
BUN: 10 mg/dL (ref 6–23)
CO2: 26 mEq/L (ref 19–32)
Calcium: 9.6 mg/dL (ref 8.4–10.5)
Chloride: 104 mEq/L (ref 96–112)
Creatinine, Ser: 0.88 mg/dL (ref 0.40–1.50)
GFR: 100.77 mL/min (ref >60.00)
Glucose, Bld: 112 mg/dL — ABNORMAL HIGH (ref 70–99)
Potassium: 4.3 mEq/L (ref 3.5–5.1)
Sodium: 140 mEq/L (ref 135–145)
Total Bilirubin: 0.4 mg/dL (ref 0.2–1.2)
Total Protein: 7.3 g/dL (ref 6.0–8.3)

## 2022-01-04 LAB — MAGNESIUM: Magnesium: 1.8 mg/dL (ref 1.5–2.5)

## 2022-01-04 LAB — CBC
HCT: 46.3 % (ref 39.0–52.0)
Hemoglobin: 15.6 g/dL (ref 13.0–17.0)
MCHC: 33.6 g/dL (ref 30.0–36.0)
MCV: 93.4 fl (ref 78.0–100.0)
Platelets: 354 10*3/uL (ref 150.0–400.0)
RBC: 4.96 Mil/uL (ref 4.22–5.81)
RDW: 13.7 % (ref 11.5–15.5)
WBC: 10.5 10*3/uL (ref 4.0–10.5)

## 2022-01-04 LAB — TSH: TSH: 1.15 u[IU]/mL (ref 0.35–5.50)

## 2022-01-05 NOTE — Progress Notes (Signed)
? ?  THERAPIST PROGRESS NOTE ? ?Session Time: 9:00 am-9:45 am ? ?Type of Therapy: Individual Therapy ? ?Purpose of Session: Adrick will manage mood and anxiety as evidenced by regulating physical responses to stressors and trauma, and cope with daily stressors for 5 out of 7 days for 60 days. ? ?ProgressTowards Goals: Progressing ? ?Interventions: Therapist utilized CBT and Solution focused brief therapy to address mood and anxiety. Therapist provided support and empathy to patient during session. Therapist explored patient's triggers for mood. Therapist worked with patient to identify ways to manage his thoughts and physical symptoms.  ? ?Effectiveness: Patient was oriented x4 (person, place,situation, and time). Patient was distracted, alert, depressed, and cooperative. Patient was casually dressed, and appropriately groomed. Patient has been experiencing an increase in tension in his body, jaw, and grinding his teeth. Patient continues to focus on the future such as when he starts work he is focused on the end of work. Patient is not staying in the present. He feels like time is his worst enemy. Patient was not able to complete paperwork due to writing out his thoughts trigger anxiety. Patient understood depressive thoughts, and negative thinking patterns. Patient will focus on paying attention to his thoughts and replace negative thoughts with more healthy thoughts.  Patient understood that he needs to focus on attending to the physical responses to anxiety and depression especially the tension in his body. Patient will stretch and adjust his jaw to avoid grinding his teeth. Patient also expressed a desire for companionship but doesn't know where to find a partner of if he is ready.  ? ?Patient engaged in session. Patient responded well to interventions. Patient continues to meet criteria for PTSD. Patient will continue in outpatient therapy due to being the least restrictive service to meet his needs. Patient  made minimal progress on his goals.  ? ?Suicidal/Homicidal: Nowithout intent/plan ? ?Plan: Return again in 2-4 weeks. Patient will pay attention to his thoughts that are rooted in his depression and anxiety. Patient will manage his physical symptoms of stress such as stretching, etc.  ? ?Diagnosis: PTSD (post-traumatic stress disorder) ? ?Collaboration of Care: Psychiatrist AEB reviewing documentation and discussing as needed the progress of patient with Dr. Gilmore Laroche ? ?Patient/Guardian was advised Release of Information must be obtained prior to any record release in order to collaborate their care with an outside provider. Patient/Guardian was advised if they have not already done so to contact the registration department to sign all necessary forms in order for [place] to release information regarding their care.  ? ?Consent: Patient/Guardian gives verbal consent for treatment and assignment of benefits for services provided during this visit. Patient/Guardian expressed understanding and agreed to proceed.  ? ?Bynum Bellows, LCSW ?01/05/2022 ? ?

## 2022-01-08 ENCOUNTER — Other Ambulatory Visit (HOSPITAL_COMMUNITY): Payer: Self-pay | Admitting: Psychiatry

## 2022-01-08 DIAGNOSIS — F322 Major depressive disorder, single episode, severe without psychotic features: Secondary | ICD-10-CM

## 2022-01-08 DIAGNOSIS — F4310 Post-traumatic stress disorder, unspecified: Secondary | ICD-10-CM

## 2022-01-12 ENCOUNTER — Other Ambulatory Visit: Payer: Self-pay | Admitting: Family Medicine

## 2022-01-12 DIAGNOSIS — F411 Generalized anxiety disorder: Secondary | ICD-10-CM

## 2022-01-13 ENCOUNTER — Ambulatory Visit (HOSPITAL_COMMUNITY): Payer: BC Managed Care – PPO | Admitting: Psychiatry

## 2022-01-13 ENCOUNTER — Encounter (HOSPITAL_COMMUNITY): Payer: Self-pay | Admitting: Psychiatry

## 2022-01-13 VITALS — BP 144/78 | HR 96 | Temp 98.9°F | Ht 72.0 in | Wt 259.0 lb

## 2022-01-13 DIAGNOSIS — F322 Major depressive disorder, single episode, severe without psychotic features: Secondary | ICD-10-CM | POA: Diagnosis not present

## 2022-01-13 DIAGNOSIS — F411 Generalized anxiety disorder: Secondary | ICD-10-CM | POA: Diagnosis not present

## 2022-01-13 DIAGNOSIS — F4310 Post-traumatic stress disorder, unspecified: Secondary | ICD-10-CM | POA: Diagnosis not present

## 2022-01-13 DIAGNOSIS — Z789 Other specified health status: Secondary | ICD-10-CM | POA: Diagnosis not present

## 2022-01-13 MED ORDER — LAMOTRIGINE 25 MG PO TABS: 25.0000 mg | ORAL_TABLET | Freq: Every day | ORAL | 0 refills | Status: DC
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
## 2022-01-18 ENCOUNTER — Ambulatory Visit (INDEPENDENT_AMBULATORY_CARE_PROVIDER_SITE_OTHER): Payer: BC Managed Care – PPO | Admitting: Licensed Clinical Social Worker

## 2022-01-18 DIAGNOSIS — F4310 Post-traumatic stress disorder, unspecified: Secondary | ICD-10-CM | POA: Diagnosis not present

## 2022-01-18 NOTE — Progress Notes (Signed)
? ?  THERAPIST PROGRESS NOTE ? ?Session Time: 9:00 am-9:45 am ? ?Type of Therapy: Individual Therapy ? ?Purpose of Session: Aaron [name] will manage mood and anxiety as evidenced by regulating physical responses to stressors and trauma, and cope with daily stressors for 5 out of 7 days for 60 days. ? ?ProgressTowards Goals: Progressing ? ?Interventions: Therapist utilized CBT and Solution focused brief therapy to address mood and anxiety. Therapist provided support and empathy to patient during session. Therapist explored patient's thoughts improved mood, and thoughts that keep him stuck.  ? ?Effectiveness: Patient was oriented x4 (person, place, situation, and time). Patient was alert, engaged, pleasant, and cooperative. Patient was casually dressed, and appropriately groomed.  Patient shared his frustrated of his last psychiatry appointment. He was told that if he didn't stop drinking that there was not much else that could be done for him. Patient felt defeated and shut down. Patient was able to identify alternative thoughts to explain this experience including alcohol reduces the impact of the medication he is on. Patient identified thoughts that made him happy, and experience peace. Patient had a purpose to his day (spending time with his father looking at what a Oceanographer reported), and not having voices, etc. Patient was able to identify unhealthy thoughts from his traumas such as "It was my fault," "what happened says something bad about me," or "i'll never have future happiness." Patient is stuck with these thoughts ? ?Patient engaged in session. Patient responded well to interventions. Patient continues to meet criteria for PTSD. Patient will continue in outpatient therapy due to being the least restrictive service to meet his needs. Patient made minimal progress on his goals.  ? ?Suicidal/Homicidal: Nowithout intent/plan ? ?Plan: Return again in 2-4 weeks. Patient will pay attention to his thoughts that are  rooted in his trauma and thoughts that boost this mood.  ? ?Diagnosis: PTSD (post-traumatic stress disorder) ? ?Collaboration of Care: Psychiatrist AEB reviewing documentation and discussing as needed the progress of patient with Dr. De Nurse ? ?Patient/Guardian was advised Release of Information must be obtained prior to any record release in order to collaborate their care with an outside provider. Patient/Guardian was advised if they have not already done so to contact the registration department to sign all necessary forms in order for [place] to release information regarding their care.  ? ?Consent: Patient/Guardian gives verbal consent for treatment and assignment of benefits for services provided during this visit. Patient/Guardian expressed understanding and agreed to proceed.  ? ?Glori Bickers, LCSW ?01/18/2022 ? ?

## 2022-02-02 ENCOUNTER — Encounter: Payer: Self-pay | Admitting: Family Medicine

## 2022-02-02 ENCOUNTER — Other Ambulatory Visit: Payer: Self-pay | Admitting: Family Medicine

## 2022-02-02 ENCOUNTER — Ambulatory Visit: Payer: BC Managed Care – PPO | Admitting: Family Medicine

## 2022-02-02 VITALS — BP 118/64 | HR 92 | Temp 97.9°F | Ht 72.0 in | Wt 254.5 lb

## 2022-02-02 DIAGNOSIS — E1165 Type 2 diabetes mellitus with hyperglycemia: Secondary | ICD-10-CM | POA: Diagnosis not present

## 2022-02-02 DIAGNOSIS — Z794 Long term (current) use of insulin: Secondary | ICD-10-CM | POA: Diagnosis not present

## 2022-02-02 DIAGNOSIS — E782 Mixed hyperlipidemia: Secondary | ICD-10-CM

## 2022-02-02 LAB — LIPID PANEL
Cholesterol: 112 mg/dL (ref 0–200)
HDL: 35.5 mg/dL — ABNORMAL LOW (ref >39.00)
LDL Cholesterol: 53 mg/dL (ref 0–99)
NonHDL: 76.2
Total CHOL/HDL Ratio: 3
Triglycerides: 115 mg/dL (ref 0.0–149.0)
VLDL: 23 mg/dL (ref 0.0–40.0)

## 2022-02-02 LAB — BASIC METABOLIC PANEL
BUN: 11 mg/dL (ref 6–23)
CO2: 25 mEq/L (ref 19–32)
Calcium: 9.2 mg/dL (ref 8.4–10.5)
Chloride: 105 mEq/L (ref 96–112)
Creatinine, Ser: 0.85 mg/dL (ref 0.40–1.50)
GFR: 101.77 mL/min (ref >60.00)
Glucose, Bld: 98 mg/dL (ref 70–99)
Potassium: 4.2 mEq/L (ref 3.5–5.1)
Sodium: 140 mEq/L (ref 135–145)

## 2022-02-02 MED ORDER — SEMGLEE (YFGN) 100 UNIT/ML ~~LOC~~ SOPN: 38.0000 [IU] | PEN_INJECTOR | Freq: Every day | SUBCUTANEOUS | Status: DC

## 2022-02-02 MED ORDER — SEMAGLUTIDE (2 MG/DOSE) 8 MG/3ML ~~LOC~~ SOPN: 2.0000 mg | PEN_INJECTOR | SUBCUTANEOUS | 5 refills | Status: DC

## 2022-02-02 MED ORDER — INSULIN GLARGINE 100 UNITS/ML SOLOSTAR PEN: 38.0000 [IU] | PEN_INJECTOR | Freq: Every day | SUBCUTANEOUS | 11 refills | Status: DC

## 2022-02-02 MED ORDER — INSULIN GLARGINE 100 UNIT/ML SOLOSTAR PEN
38.0000 [IU] | PEN_INJECTOR | Freq: Every day | SUBCUTANEOUS | 11 refills | Status: DC
Start: 2022-02-02 — End: 2022-02-02

## 2022-02-02 MED ORDER — INSULIN GLARGINE 100 UNIT/ML SOLOSTAR PEN: 28.0000 [IU] | PEN_INJECTOR | Freq: Every day | SUBCUTANEOUS | 11 refills | Status: DC

## 2022-02-02 NOTE — Patient Instructions (Signed)
Give [place] 2-3 business days to get the results of your labs back.  ? ?Keep the diet clean and stay active. ? ?Check your sugars around 2-3 times per week. Alternate checking in the morning before you eat, in the afternoon and before bed. Write them down and bring it to your next appointment.  ? ?Let me know if there are cost issues with the medication.  ? ?Let us know if you need anything. ?

## 2022-02-02 NOTE — Progress Notes (Signed)
Chief Complaint  ?Patient presents with  ? Follow-up  ?  6 week  ? ? ?Subjective: ?Patient is a 51 y.o. male here for DM f/u. ? ?A1c was 12.4 6 weeks ago. Checks sugar rarely, today was low 100's. Had 2 episodes of hypoglycemia. No obvious cause for those. Taking long acting insulin 38 u at night. Diet is fair. Appetite reduced since increasing Ozempic to 1 mg/week. Compliant w Metformin XR 1000 mg bid.  ? ?Hyperlipidemia ?Patient presents for mixed hyperlipidemia follow up. ?Currently being treated with Crestor 20 mg/d and compliance with treatment thus far has been good. ?He denies myalgias. ?Diet/exercise as above. ?No Cp or SOB.  ?The patient is not known to have coexisting coronary artery disease. ? ?Past Medical History:  ?Diagnosis Date  ? Anxiety   ? Depression   ? GERD (gastroesophageal reflux disease)   ? Heart murmur   ? History of chicken pox   ? Hyperlipidemia   ? Hypertension   ? PTSD (post-traumatic stress disorder)   ? Sleep apnea   ? Tinnitus of both ears   ? ? ?Objective: ?BP 118/64   Pulse 92   Temp 97.9 ?F (36.6 ?C) (Oral)   Ht 6' (1.829 m)   Wt 254 lb 8 oz (115.4 kg)   SpO2 96%   BMI 34.52 kg/m?  ?General: Awake, appears stated age ?Heart: RRR, no LE edema ?Lungs: CTAB, no rales, wheezes or rhonchi. No accessory muscle use ?Psych: Age appropriate judgment and insight, normal affect and mood ? ?Assessment and Plan: ?Type 2 diabetes mellitus with hyperglycemia, with long-term current use of insulin (HCC) - Plan: Basic metabolic panel, Lipid panel, DISCONTINUED: SEMGLEE, YFGN, 100 UNIT/ML Pen, DISCONTINUED: insulin glargine (LANTUS) 100 unit/mL SOPN ? ?Mixed hyperlipidemia ? ?Chronic, uncontrolled. Will see what glucose is today. If elevated still, will increase Ozempic to 2 mg/week. May be able to reduce basal insulin. Counseled on diet/exercise. Would like him to ck sugars more freq. Endo appt coming up, may not need.  ?Chronic, unstable. Cont Crestor 20 mg/d. Will see what TG's are today.  Will add fibrate if not controlled. ?Fu in 7 weeks.  ?The patient voiced understanding and agreement to the plan. ? ?Sharlene Dory, DO ?02/02/22  ?7:33 AM ? ? ? ? ?

## 2022-02-14 ENCOUNTER — Other Ambulatory Visit (HOSPITAL_COMMUNITY): Payer: Self-pay | Admitting: Psychiatry

## 2022-03-03 ENCOUNTER — Other Ambulatory Visit (HOSPITAL_COMMUNITY): Payer: Self-pay

## 2022-03-03 MED ORDER — LAMOTRIGINE 25 MG PO TABS: ORAL_TABLET | ORAL | 0 refills | Status: DC

## 2022-03-10 ENCOUNTER — Ambulatory Visit (HOSPITAL_COMMUNITY): Payer: BC Managed Care – PPO | Admitting: Psychiatry

## 2022-03-23 ENCOUNTER — Ambulatory Visit: Payer: BC Managed Care – PPO | Admitting: Family Medicine

## 2022-03-23 ENCOUNTER — Encounter: Payer: Self-pay | Admitting: Family Medicine

## 2022-03-23 VITALS — BP 118/80 | HR 84 | Temp 97.6°F | Ht 72.0 in | Wt 242.4 lb

## 2022-03-23 DIAGNOSIS — E782 Mixed hyperlipidemia: Secondary | ICD-10-CM | POA: Diagnosis not present

## 2022-03-23 DIAGNOSIS — E1165 Type 2 diabetes mellitus with hyperglycemia: Secondary | ICD-10-CM | POA: Diagnosis not present

## 2022-03-23 DIAGNOSIS — Z794 Long term (current) use of insulin: Secondary | ICD-10-CM

## 2022-03-23 LAB — COMPREHENSIVE METABOLIC PANEL
ALT: 14 U/L (ref 0–53)
AST: 12 U/L (ref 0–37)
Albumin: 4.5 g/dL (ref 3.5–5.2)
Alkaline Phosphatase: 43 U/L (ref 39–117)
BUN: 8 mg/dL (ref 6–23)
CO2: 24 mEq/L (ref 19–32)
Calcium: 9.1 mg/dL (ref 8.4–10.5)
Chloride: 105 mEq/L (ref 96–112)
Creatinine, Ser: 0.86 mg/dL (ref 0.40–1.50)
GFR: 101.32 mL/min (ref >60.00)
Glucose, Bld: 77 mg/dL (ref 70–99)
Potassium: 4.3 mEq/L (ref 3.5–5.1)
Sodium: 140 mEq/L (ref 135–145)
Total Bilirubin: 0.5 mg/dL (ref 0.2–1.2)
Total Protein: 6.8 g/dL (ref 6.0–8.3)

## 2022-03-23 LAB — LIPID PANEL
Cholesterol: 97 mg/dL (ref 0–200)
HDL: 41.1 mg/dL (ref >39.00)
LDL Cholesterol: 45 mg/dL (ref 0–99)
NonHDL: 56.21
Total CHOL/HDL Ratio: 2
Triglycerides: 57 mg/dL (ref 0.0–149.0)
VLDL: 11.4 mg/dL (ref 0.0–40.0)

## 2022-03-23 LAB — HEMOGLOBIN A1C: Hgb A1c MFr Bld: 5.8 % (ref 4.6–6.5)

## 2022-03-23 MED ORDER — INSULIN GLARGINE 100 UNIT/ML SOLOSTAR PEN
30.0000 [IU] | PEN_INJECTOR | Freq: Every day | SUBCUTANEOUS | 11 refills | Status: DC
Start: 2022-03-23 — End: 2023-11-27

## 2022-03-23 NOTE — Progress Notes (Signed)
Subjective:   Chief Complaint  Patient presents with   Follow-up    Aaron [name] is a 51 y.o. male here for follow-up of diabetes.   Aaron [name]'s self monitored glucose range is 70-90's fasting.  Patient is sometimes hypoglycemic reactions. He checks his glucose levels 1 time(s) per day. Patient does require insulin. Lantus 38 u qhs Medications include: Ozempic 2 mg/week Diet is doing well.  Exercise: active at work No Cp or SOB.   Hyperlipidemia Patient presents for dyslipidemia follow up. Currently being treated with Crestor 20 mg/d and compliance with treatment thus far has been good. He denies myalgias. Diet/exercise as above.  The patient is known to have coexisting coronary artery disease.  Past Medical History:  Diagnosis Date   Anxiety    Depression    GERD (gastroesophageal reflux disease)    Heart murmur    History of chicken pox    Hyperlipidemia    Hypertension    PTSD (post-traumatic stress disorder)    Sleep apnea    Tinnitus of both ears      Related testing: Retinal exam: Done Pneumovax: done  Objective:  BP 118/80   Pulse 84   Temp 97.6 F (36.4 C) (Oral)   Ht 6' (1.829 m)   Wt 242 lb 6 oz (109.9 kg)   SpO2 98%   BMI 32.87 kg/m  General:  Well developed, well nourished, in no apparent distress Eyes:  Pupils equal and round, sclera anicteric without injection  Lungs:  CTAB, no access msc use Cardio:  RRR, no bruits, no LE edema Psych: Age appropriate judgment and insight  Assessment:   Type 2 diabetes mellitus with hyperglycemia, with long-term current use of insulin (HCC) - Plan: Hemoglobin A1c  Mixed hyperlipidemia - Plan: Comprehensive metabolic panel, Lipid panel   Plan:   Chronic, uncontrolled. Cont Ozempic 2 mg/week and reduce Lantus 38 u qhs to 30 u qhs for now. Cont to monitor sugars. Goal A1c is <7. Counseled on diet and exercise. Chronic, unsure if controlled. Cont Crestor 20 mg/d. May need to increase dosage or add low dose  fenofibrate Shingrix rec'd.  F/u in 3-6 mo. The patient voiced understanding and agreement to the plan.  [name] [place], DO 03/23/22 7:00 AM

## 2022-03-23 NOTE — Patient Instructions (Addendum)
Give [place] 2-3 business days to get the results of your labs back. The results of your labs will dictate when we follow up.   Keep the diet clean and stay active.  The Shingrix vaccine (for shingles) is a 2 shot series spaced 2-6 months apart. It can make people feel low energy, achy and almost like they have the flu for 48 hours after injection. 1/5 people can have nausea and/or vomiting. Please plan accordingly when deciding on when to get this shot. Call our office for a nurse visit appointment to get this. The second shot of the series is less severe regarding the side effects, but it still lasts 48 hours.   Let us know if you need anything.

## 2022-04-12 ENCOUNTER — Encounter (HOSPITAL_COMMUNITY): Payer: Self-pay | Admitting: Psychiatry

## 2022-04-12 ENCOUNTER — Ambulatory Visit (INDEPENDENT_AMBULATORY_CARE_PROVIDER_SITE_OTHER): Payer: BC Managed Care – PPO | Admitting: Psychiatry

## 2022-04-12 VITALS — BP 152/80 | Temp 98.6°F | Ht 72.0 in | Wt 242.0 lb

## 2022-04-12 DIAGNOSIS — F322 Major depressive disorder, single episode, severe without psychotic features: Secondary | ICD-10-CM | POA: Diagnosis not present

## 2022-04-12 DIAGNOSIS — F411 Generalized anxiety disorder: Secondary | ICD-10-CM | POA: Diagnosis not present

## 2022-04-12 DIAGNOSIS — F4310 Post-traumatic stress disorder, unspecified: Secondary | ICD-10-CM

## 2022-04-12 DIAGNOSIS — Z789 Other specified health status: Secondary | ICD-10-CM

## 2022-04-12 DIAGNOSIS — F1090 Alcohol use, unspecified, uncomplicated: Secondary | ICD-10-CM

## 2022-04-12 MED ORDER — ARIPIPRAZOLE 30 MG PO TABS: 30.0000 mg | ORAL_TABLET | Freq: Every day | ORAL | 0 refills | Status: DC
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
## 2022-05-09 ENCOUNTER — Ambulatory Visit: Payer: BC Managed Care – PPO | Admitting: Internal Medicine

## 2022-05-09 NOTE — Progress Notes (Deleted)
Name: Aaron [name]  MRN/ DOB: [id number], 1971/03/16   Age/ Sex: 51 y.o., male    PCP: Sharlene Dory, DO   Reason for Endocrinology Evaluation: Type {NUMBERS 1 OR 2:522190} Diabetes Mellitus     Date of Initial Endocrinology Visit: 05/09/2022     PATIENT IDENTIFIER: Aaron [name] is a 51 y.o. male with a past medical history of ***. The patient presented for initial endocrinology clinic visit on 05/09/2022 for consultative assistance with his diabetes management.    HPI: Aaron [name] was    Diagnosed with DM *** Prior Medications tried/Intolerance: *** Currently checking blood sugars *** x / day,  before breakfast and ***.  Hypoglycemia episodes : ***               Symptoms: ***                 Frequency: ***/  Hemoglobin A1c has ranged from *** in ***, peaking at *** in ***. Patient required assistance for hypoglycemia:  Patient has required hospitalization within the last 1 year from hyper or hypoglycemia:   In terms of diet, the patient ***   HOME DIABETES REGIMEN: Basal: ***  Bolus: ***   Statin: {Yes/No:11203} ACE-I/ARB: {YES/NO:17245} Prior Diabetic Education: {Yes/No:11203}   METER DOWNLOAD SUMMARY: Date range evaluated: *** Fingerstick Blood Glucose Tests = *** Average Number Tests/Day = *** Overall Mean FS Glucose = *** Standard Deviation = ***  BG Ranges: Low = *** High = ***   Hypoglycemic Events/30 Days: BG < 50 = *** Episodes of symptomatic severe hypoglycemia = ***   DIABETIC COMPLICATIONS: Microvascular complications:  *** Denies: *** Last eye exam: Completed   Macrovascular complications:  *** Denies: CAD, PVD, CVA   PAST HISTORY: Past Medical History:  Past Medical History:  Diagnosis Date   Anxiety    Depression    GERD (gastroesophageal reflux disease)    Heart murmur    History of chicken pox    Hyperlipidemia    Hypertension    PTSD (post-traumatic stress disorder)    Sleep apnea    Tinnitus of both  ears    Past Surgical History:  Past Surgical History:  Procedure Laterality Date   broken fingers     HERNIA REPAIR      Social History:  reports that he has been smoking cigarettes. He has a 2.00 pack-year smoking history. He has never used smokeless tobacco. He reports current alcohol use of about 18.0 standard drinks of alcohol per week. He reports that he does not use drugs. Family History:  Family History  Problem Relation Age of Onset   Diabetes Mother    Hyperlipidemia Mother    Hypertension Mother    Heart disease Mother    Cancer Father    Hyperlipidemia Father    Hypertension Father    Other Father        kidney removal   Heart disease Father    Kidney disease Father    Cancer Sister    Hypertension Sister    Colon cancer Neg Hx    Esophageal cancer Neg Hx    Rectal cancer Neg Hx    Stomach cancer Neg Hx      HOME MEDICATIONS: Allergies as of 05/09/2022   No Known Allergies      Medication List        Accurate as of May 09, 2022  7:32 AM. If you have any questions, ask your nurse or doctor.  amLODipine 5 MG tablet Commonly known as: NORVASC TAKE 1 TABLET (5 MG TOTAL) BY MOUTH DAILY.   ARIPiprazole 30 MG tablet Commonly known as: ABILIFY Take 1 tablet (30 mg total) by mouth daily. Delete prior refills of abilify   BD Pen Needle Nano 2nd Gen 32G X 4 MM Misc Generic drug: Insulin Pen Needle USE WITH INSULIN PEN AS DIRECTED   busPIRone 10 MG tablet Commonly known as: BUSPAR TAKE 1 TABLET BY MOUTH TWICE A DAY   insulin glargine 100 UNIT/ML Solostar Pen Commonly known as: LANTUS Inject 30 Units into the skin at bedtime.   lamoTRIgine 25 MG tablet Commonly known as: LAMICTAL TAKE 1 TABLET BY MOUTH DAILY FOR 1 WEEK, THEN START TAKING 2 TABLETS DAILY   metFORMIN 1000 MG tablet Commonly known as: GLUCOPHAGE Take 1 tablet (1,000 mg total) by mouth 2 (two) times daily with a meal.   OneTouch Delica Plus Lancet33G Misc Apply  topically 3 (three) times daily.   OneTouch Verio test strip Generic drug: glucose blood Use to check blood sugar 3 time(s) daily.   potassium chloride 10 MEQ tablet Commonly known as: Klor-Con 10 Take 1 tablet (10 mEq total) by mouth daily for 5 days.   rosuvastatin 20 MG tablet Commonly known as: CRESTOR TAKE 1 TABLET BY MOUTH EVERY DAY   Semaglutide (2 MG/DOSE) 8 MG/3ML Sopn Inject 2 mg as directed once a week.   valsartan 160 MG tablet Commonly known as: DIOVAN TAKE 1 TABLET BY MOUTH EVERY DAY         ALLERGIES: No Known Allergies   REVIEW OF SYSTEMS: A comprehensive ROS was conducted with the patient and is negative except as per HPI and below:  ROS    OBJECTIVE:   VITAL SIGNS: There were no vitals taken for this visit.   PHYSICAL EXAM:  General: Pt appears well and is in NAD  Hydration: Well-hydrated with moist mucous membranes and good skin turgor  HEENT: Head: Unremarkable with good dentition. Oropharynx clear without exudate.  Eyes: External eye exam normal without stare, lid lag or exophthalmos.  EOM intact.  PERRL.  Neck: General: Supple without adenopathy or carotid bruits. Thyroid: Thyroid size normal.  No goiter or nodules appreciated. No thyroid bruit.  Lungs: Clear with good BS bilat with no rales, rhonchi, or wheezes  Heart: RRR with normal S1 and S2 and no gallops; no murmurs; no rub  Abdomen: Normoactive bowel sounds, soft, nontender, without masses or organomegaly palpable  Extremities:  Lower extremities - No pretibial edema. No lesions.  Skin: Normal texture and temperature to palpation. No rash noted. No Acanthosis nigricans/skin tags. No lipohypertrophy.  Neuro: MS is good with appropriate affect, pt is alert and Ox3    DM foot exam:    DATA REVIEWED:  Lab Results  Component Value Date   HGBA1C 5.8 03/23/2022   HGBA1C 12.4 (H) 12/22/2021   HGBA1C 8.5 (H) 08/25/2021   Lab Results  Component Value Date   MICROALBUR 42.8 (H)  08/25/2021   LDLCALC 45 03/23/2022   CREATININE 0.86 03/23/2022   Lab Results  Component Value Date   MICRALBCREAT 15.0 08/25/2021    Lab Results  Component Value Date   CHOL 97 03/23/2022   HDL 41.10 03/23/2022   LDLCALC 45 03/23/2022   LDLDIRECT 81.0 12/22/2021   TRIG 57.0 03/23/2022   CHOLHDL 2 03/23/2022        ASSESSMENT / PLAN / RECOMMENDATIONS:   1) Type 2 Diabetes Mellitus, sub-optimally controlled, With***  complications - Most recent A1c of ** %. Goal A1c < 7.0 %.    Plan: GENERAL: ***  MEDICATIONS: ***  EDUCATION / INSTRUCTIONS: BG monitoring instructions: Patient is instructed to check his blood sugars *** times a day, ***. Call [facility] if: BG persistently < 70  I reviewed the Rule of 15 for the treatment of hypoglycemia in detail with the patient. Literature supplied.   2) Diabetic complications:  Eye: Does *** have known diabetic retinopathy.  Neuro/ Feet: Does *** have known diabetic peripheral neuropathy. Renal: Patient does *** have known baseline CKD. He is  on an ACEI/ARB at present.  3) Lipids: Patient is *** on a statin.    4) Hypertension: ***  at goal of < 140/90 mmHg.       Signed electronically by: Lyndle Herrlich, MD  [facility]  [facility] [address] [place] [place], [place] [address] Phone: [phone] FAX: [phone]   CC: Sharlene Dory, DO [address] [place] [address] Phone: [phone]  Fax: [phone]    Return to Endocrinology clinic as below: Future Appointments  Date Time Provider Department Center  05/09/2022  8:30 AM Theone Bowell, Konrad Dolores, MD [facility] None  05/30/2022  8:00 AM Sheets, Ivin Booty, LCSW [facility] None  06/16/2022  3:00 PM Thresa Ross, MD [facility] None  09/26/2022  7:45 AM Sharlene Dory, DO [facility]

## 2022-05-30 ENCOUNTER — Ambulatory Visit (INDEPENDENT_AMBULATORY_CARE_PROVIDER_SITE_OTHER): Payer: BC Managed Care – PPO | Admitting: Licensed Clinical Social Worker

## 2022-05-30 DIAGNOSIS — F4310 Post-traumatic stress disorder, unspecified: Secondary | ICD-10-CM | POA: Diagnosis not present

## 2022-05-31 NOTE — Progress Notes (Signed)
   THERAPIST PROGRESS NOTE  Session Time: 8:00 am-8:45 am  Type of Therapy: Individual Therapy  Purpose of Session: Aaron [name] will manage mood and anxiety as evidenced by regulating physical responses to stressors and trauma, and cope with daily stressors for 5 out of 7 days for 60 days.  ProgressTowards Goals: Progressing  Interventions: Therapist utilized CBT and Solution focused brief therapy to address mood and anxiety. Therapist provided support and empathy to patient during session. Therapist worked with patient on cognitive defusion of his distressing thoughts.   Effectiveness: Patient was oriented x4 (person, place, situation, and time). Patient was casually dressed, and appropriately groomed. Patient was alert, engaged, pleasant, and cooperative. Patient's mood has been up and down. He continues to struggle hearing the voices saying negative things such as "you are worthless" or "you are an asshole." Patient has difficulty discerning if it is his thoughts or "spiritual warfare." Patient doesn't agree with the thoughts. He feels like has been forgiven by God for his mistakes in the past but struggles to forgive himself at times. Patient thinks about his deceased wife often. Between the grief thoughts and "spiritual warfare" patient spirals with his thoughts. Patient agreed to pay attention to his thoughts, label his thoughts (nuisance), review the impact of his thoughts, and try to put space between himself and the thoughts. He is grinding his teeth due to stress and has reduced alcohol use.   Patient engaged in session. Patient responded well to interventions. Patient continues to meet criteria for PTSD. Patient will continue in outpatient therapy due to being the least restrictive service to meet his needs. Patient made minimal progress on his goals.   Suicidal/Homicidal: Nowithout intent/plan  Plan: Return again in 2-4 weeks. Patient will work to separate himself from his thoughts through  cognitive defusion.   Diagnosis: PTSD (post-traumatic stress disorder)  Collaboration of Care: Psychiatrist AEB reviewing documentation and discussing as needed the progress of patient with Dr. Gilmore Laroche  Patient/Guardian was advised Release of Information must be obtained prior to any record release in order to collaborate their care with an outside provider. Patient/Guardian was advised if they have not already done so to contact the registration department to sign all necessary forms in order for [place] to release information regarding their care.   Consent: Patient/Guardian gives verbal consent for treatment and assignment of benefits for services provided during this visit. Patient/Guardian expressed understanding and agreed to proceed.   Bynum Bellows, LCSW 05/31/2022

## 2022-06-16 ENCOUNTER — Encounter (HOSPITAL_COMMUNITY): Payer: Self-pay | Admitting: Psychiatry

## 2022-06-16 ENCOUNTER — Ambulatory Visit (INDEPENDENT_AMBULATORY_CARE_PROVIDER_SITE_OTHER): Payer: BC Managed Care – PPO | Admitting: Psychiatry

## 2022-06-16 VITALS — BP 130/80 | Temp 98.6°F | Ht 72.0 in | Wt 232.0 lb

## 2022-06-16 DIAGNOSIS — F411 Generalized anxiety disorder: Secondary | ICD-10-CM | POA: Diagnosis not present

## 2022-06-16 DIAGNOSIS — Z789 Other specified health status: Secondary | ICD-10-CM | POA: Diagnosis not present

## 2022-06-16 DIAGNOSIS — F4310 Post-traumatic stress disorder, unspecified: Secondary | ICD-10-CM | POA: Diagnosis not present

## 2022-06-16 DIAGNOSIS — F322 Major depressive disorder, single episode, severe without psychotic features: Secondary | ICD-10-CM

## 2022-06-16 MED ORDER — ARIPIPRAZOLE 30 MG PO TABS: 30.0000 mg | ORAL_TABLET | Freq: Every day | ORAL | 0 refills | Status: DC

## 2022-06-16 MED ORDER — LAMOTRIGINE 25 MG PO TABS: ORAL_TABLET | ORAL | 0 refills | Status: DC

## 2022-06-16 NOTE — Progress Notes (Signed)
[facility] Follow up visit  Patient Identification: Aaron [name] MRN:  [id number] Date of Evaluation:  06/16/2022 Referral Source: primary care Chief Complaint: follow up depression, PTSD Visit Diagnosis:    ICD-10-CM   1. PTSD (post-traumatic stress disorder)  F43.10 ARIPiprazole (ABILIFY) 30 MG tablet    2. GAD (generalized anxiety disorder)  F41.1     3. Depression, major, single episode, severe (HCC)  F32.2 ARIPiprazole (ABILIFY) 30 MG tablet    4. 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 Traumatic events modifying factors:church group  Duration more than 5 years   Past Psychiatric History: depression, PTSD  Previous Psychotropic Medications: No   Substance Abuse History in the last 12 months:  Yes.    Consequences of Substance Abuse: Last use amphetamine more then an year, understands the risk of paranoia,hallucinations and iimpaired judjemenmt  Past Medical History:  Past Medical History:  Diagnosis Date   Anxiety    Depression    GERD (gastroesophageal reflux disease)    Heart murmur    History of chicken pox    Hyperlipidemia     Hypertension    PTSD (post-traumatic stress disorder)    Sleep apnea    Tinnitus of both ears     Past Surgical History:  Procedure Laterality Date   broken fingers     HERNIA REPAIR      Family Psychiatric History: father: alcohol use  Family History:  Family History  Problem Relation Age of Onset   Diabetes Mother    Hyperlipidemia Mother    Hypertension Mother    Heart disease Mother    Cancer Father    Hyperlipidemia Father    Hypertension Father    Other Father        kidney removal   Heart disease Father    Kidney disease Father    Cancer Sister    Hypertension Sister    Colon cancer Neg Hx    Esophageal cancer Neg Hx    Rectal cancer Neg Hx    Stomach cancer Neg Hx     Social History:   Social History   Socioeconomic History   Marital status: Married    Spouse name: Not on file   Number of children: 1   Years of education: Not on file   Highest education level: Not on file  Occupational History   Not on file  Tobacco Use   Smoking status: Every Day    Packs/day: 1.00    Years: 2.00    Total pack years: 2.00    Types: Cigarettes  Last attempt to quit: 09/19/1996    Years since quitting: 25.7   Smokeless tobacco: Never   Tobacco comments:    Quit for 20 years and restarted 2 years ago.    Vaping Use   Vaping Use: Never used  Substance and Sexual Activity   Alcohol use: Yes    Alcohol/week: 18.0 standard drinks of alcohol    Types: 18 Cans of beer per week    Comment: Reports drinks 3-4 per day.   Drug use: No   Sexual activity: Not Currently  Other Topics Concern   Not on file  Social History Narrative   Not on file   Social Determinants of Health   Financial Resource Strain: Not on file  Food Insecurity: Not on file  Transportation Needs: Not on file  Physical Activity: Not on file  Stress: Not on file  Social Connections: Not on file     Allergies:  No Known Allergies  Metabolic Disorder Labs: Lab Results  Component Value  Date   HGBA1C 5.8 03/23/2022   MPG 120 (H) 12/30/2011   No results found for: "PROLACTIN" Lab Results  Component Value Date   CHOL 97 03/23/2022   TRIG 57.0 03/23/2022   HDL 41.10 03/23/2022   CHOLHDL 2 03/23/2022   VLDL 11.4 03/23/2022   LDLCALC 45 03/23/2022   LDLCALC 53 02/02/2022   Lab Results  Component Value Date   TSH 1.15 01/03/2022    Therapeutic Level Labs: No results found for: "LITHIUM" No results found for: "CBMZ" No results found for: "VALPROATE"  Current Medications: Current Outpatient Medications  Medication Sig Dispense Refill   amLODipine (NORVASC) 5 MG tablet TAKE 1 TABLET (5 MG TOTAL) BY MOUTH DAILY. 90 tablet 1   ARIPiprazole (ABILIFY) 30 MG tablet Take 1 tablet (30 mg total) by mouth daily. Delete prior refills of abilify 90 tablet 0   BD PEN NEEDLE NANO 2ND GEN 32G X 4 MM MISC USE WITH INSULIN PEN AS DIRECTED 200 each 2   busPIRone (BUSPAR) 10 MG tablet TAKE 1 TABLET BY MOUTH TWICE A DAY 180 tablet 1   glucose blood (ONETOUCH VERIO) test strip Use to check blood sugar 3 time(s) daily.     insulin glargine (LANTUS) 100 UNIT/ML Solostar Pen Inject 30 Units into the skin at bedtime. 15 mL 11   lamoTRIgine (LAMICTAL) 25 MG tablet Take 2  a day for a week, then 3 a day for next week then 4  a day till running low, will change to 100mg  tablet 180 tablet 0   Lancets (ONETOUCH DELICA PLUS 123XX123) MISC Apply topically 3 (three) times daily.     metFORMIN (GLUCOPHAGE) 1000 MG tablet Take 1 tablet (1,000 mg total) by mouth 2 (two) times daily with a meal. 180 tablet 3   potassium chloride (KLOR-CON 10) 10 MEQ tablet Take 1 tablet (10 mEq total) by mouth daily for 5 days. 5 tablet 0   rosuvastatin (CRESTOR) 20 MG tablet TAKE 1 TABLET BY MOUTH EVERY DAY 90 tablet 3   Semaglutide, 2 MG/DOSE, 8 MG/3ML SOPN Inject 2 mg as directed once a week. 3 mL 5   valsartan (DIOVAN) 160 MG tablet TAKE 1 TABLET BY MOUTH EVERY DAY 90 tablet 1   No current facility-administered  medications for this visit.     Psychiatric Specialty Exam: Review of Systems  Cardiovascular:  Negative for chest pain.  Skin:  Negative for rash.  Neurological:  Negative for tremors.  Psychiatric/Behavioral:  Negative for self-injury.  The patient is nervous/anxious.     Blood pressure 130/80, temperature 98.6 F (37 C), height 6' (1.829 m), weight 232 lb (105.2 kg).Body mass index is 31.46 kg/m.  General Appearance: casual  Eye Contact:  fair  Speech:  Normal Rate  Volume:  Normal  Mood: gets stressed  Affect:  congruent  Thought Process:  Coherent  Orientation:  Full (Time, Place, and Person)  Thought Content:  Rumination  Suicidal Thoughts:  No  Homicidal Thoughts:  No  Memory:  Recent;   Fair  Judgement:  Fair  Insight:  Shallow  Psychomotor Activity:  Normal  Concentration:  Attention Span: Fair  Recall:  [employer] of Knowledge:Good  Language: Good  Akathisia:  No  Handed:    AIMS (if indicated):  no involunatary movements  Assets:  Desire for Improvement  ADL's:  Intact  Cognition: WNL  Sleep:   irregular   Screenings: PHQ2-9    Flowsheet Row Office Visit from 03/23/2022 in [facility] at [facility] [place] Visit from 12/22/2021 in [place] at [facility] from 04/29/2021 in [place] Office Visit from 03/16/2021 in [facility] Office Visit from 12/16/2020 in [place] [facility] at [facility]  PHQ-2 Total Score 3 1 2 2 4   PHQ-9 Total Score 10 4 13 13 16       [place] Office Visit from 06/16/2022 in [place] Office Visit from 04/12/2022 in [place] Office Visit from 11/25/2021 in [place] No Risk No Risk No Risk       Assessment and Plan: as  follows  Prior documentation reviewed   PTSD chronic severe :triggers remind him of trauma, he tries to distract, continue meds and therapy continue buspar  major depressive recurrent severe; rule out bipolar depression Gets subdued, increase lamictal to 50mg  then 75mg  in one week then 100mg  after 2 weeks, call for concerns  GAD:  gets anxious, continue buspar, work in therapy Alcohol dependence: has stopped, discussed relapse prevention  Fu 4 -6w Direct care time spent in office 20 min including face to face , chart review and doucmentation   Merian Capron, MD 9/28/20233:14 PM

## 2022-06-29 ENCOUNTER — Other Ambulatory Visit: Payer: Self-pay | Admitting: Family Medicine

## 2022-06-29 DIAGNOSIS — I10 Essential (primary) hypertension: Secondary | ICD-10-CM

## 2022-07-09 ENCOUNTER — Other Ambulatory Visit (HOSPITAL_COMMUNITY): Payer: Self-pay | Admitting: Psychiatry

## 2022-07-09 DIAGNOSIS — Z794 Long term (current) use of insulin: Secondary | ICD-10-CM | POA: Diagnosis not present

## 2022-07-09 DIAGNOSIS — F4310 Post-traumatic stress disorder, unspecified: Secondary | ICD-10-CM | POA: Diagnosis not present

## 2022-07-09 DIAGNOSIS — I10 Essential (primary) hypertension: Secondary | ICD-10-CM | POA: Diagnosis not present

## 2022-07-09 DIAGNOSIS — R300 Dysuria: Secondary | ICD-10-CM | POA: Diagnosis not present

## 2022-07-09 DIAGNOSIS — N3289 Other specified disorders of bladder: Secondary | ICD-10-CM | POA: Diagnosis not present

## 2022-07-09 DIAGNOSIS — K5730 Diverticulosis of large intestine without perforation or abscess without bleeding: Secondary | ICD-10-CM | POA: Diagnosis not present

## 2022-07-09 DIAGNOSIS — R350 Frequency of micturition: Secondary | ICD-10-CM | POA: Diagnosis not present

## 2022-07-09 DIAGNOSIS — E785 Hyperlipidemia, unspecified: Secondary | ICD-10-CM | POA: Diagnosis not present

## 2022-07-09 DIAGNOSIS — R351 Nocturia: Secondary | ICD-10-CM | POA: Diagnosis not present

## 2022-07-09 DIAGNOSIS — Z7984 Long term (current) use of oral hypoglycemic drugs: Secondary | ICD-10-CM | POA: Diagnosis not present

## 2022-07-09 DIAGNOSIS — Z79899 Other long term (current) drug therapy: Secondary | ICD-10-CM | POA: Diagnosis not present

## 2022-07-09 DIAGNOSIS — R000 Tachycardia, unspecified: Secondary | ICD-10-CM | POA: Diagnosis not present

## 2022-07-09 DIAGNOSIS — G4733 Obstructive sleep apnea (adult) (pediatric): Secondary | ICD-10-CM | POA: Diagnosis not present

## 2022-07-09 DIAGNOSIS — Z87891 Personal history of nicotine dependence: Secondary | ICD-10-CM | POA: Diagnosis not present

## 2022-07-09 DIAGNOSIS — E119 Type 2 diabetes mellitus without complications: Secondary | ICD-10-CM | POA: Diagnosis not present

## 2022-07-11 ENCOUNTER — Ambulatory Visit: Payer: BC Managed Care – PPO | Admitting: Family Medicine

## 2022-07-11 ENCOUNTER — Encounter: Payer: Self-pay | Admitting: Family Medicine

## 2022-07-11 VITALS — BP 120/70 | HR 90 | Temp 98.5°F | Ht 72.0 in | Wt 223.0 lb

## 2022-07-11 DIAGNOSIS — K5900 Constipation, unspecified: Secondary | ICD-10-CM

## 2022-07-11 DIAGNOSIS — R300 Dysuria: Secondary | ICD-10-CM | POA: Diagnosis not present

## 2022-07-11 DIAGNOSIS — G4700 Insomnia, unspecified: Secondary | ICD-10-CM | POA: Diagnosis not present

## 2022-07-11 DIAGNOSIS — R339 Retention of urine, unspecified: Secondary | ICD-10-CM | POA: Diagnosis not present

## 2022-07-11 MED ORDER — TRAZODONE HCL 50 MG PO TABS: 25.0000 mg | ORAL_TABLET | Freq: Every evening | ORAL | 3 refills | Status: DC | PRN

## 2022-07-11 NOTE — Progress Notes (Signed)
Chief Complaint  Patient presents with   ER followup    Subjective: Patient is a 51 y.o. male here for ER follow-up.  The patient has a several year history of insomnia.  He has been taking an over-the-counter sleep aid.  He went to the ED after not having urinated for 48 hours.  It was found he had urinary retention and he was started on Flomax and sent to the urologist.  He saw the urologist this morning where he was able to void.  He has been having constipation issues as well.  He was prescribed MiraLAX and will follow-up in 1 week.  His sleep has been poor as far as both falling asleep and staying asleep.  He is not taking anything currently.  He does see psychiatry and Abilify, BuSpar, and Lamictal have helped with the symptoms.  No homicidal or suicidal ideation.  He is not self-medicating at this time.  Past Medical History:  Diagnosis Date   Anxiety    Depression    GERD (gastroesophageal reflux disease)    Heart murmur    History of chicken pox    Hyperlipidemia    Hypertension    PTSD (post-traumatic stress disorder)    Sleep apnea    Tinnitus of both ears     Objective: BP 120/70 (BP Location: Left Arm, Patient Position: Sitting, Cuff Size: Normal)   Pulse 90   Temp 98.5 F (36.9 C) (Oral)   Ht 6' (1.829 m)   Wt 223 lb (101.2 kg)   SpO2 95%   BMI 30.24 kg/m  General: Awake, appears stated age Heart: RRR, no LE edema Lungs: CTAB, no rales, wheezes or rhonchi. No accessory muscle use Abdomen: Bowel sounds present, soft, nontender, nondistended Psych: Age appropriate judgment and insight, normal affect and mood  Assessment and Plan: Urinary retention  Constipation, unspecified constipation type  Insomnia, unspecified type - Plan: traZODone (DESYREL) 50 MG tablet  Appreciate the urology team.  Continue Flomax 0.4 mg daily. 2 tablespoons of milk of magnesia and 4 ounces of warm prune juice recommended today.  Okay to take MiraLAX tomorrow daily to ensure good  bowel movements.  Stay hydrated. Chronic, uncontrolled.  Start trazodone 25-50 mg nightly.  Follow-up in 1 month to recheck this.  Sleep hygiene information briefly discussed and provided in AVS.  Encouraged him to stay physically active. The patient voiced understanding and agreement to the plan.  [place], DO 07/11/22  3:06 PM

## 2022-07-11 NOTE — Patient Instructions (Addendum)
Try 2 tablespoons of milk of mag in 4 oz of warm prune juice. Do that and wait a couple hours. If no improvement, try a Dulcolax suppository and then let me know if we are still having issues.   Try to drink 55-60 oz of water daily outside of exercise.  Sleep Hygiene Tips: Do not watch TV or look at screens within 1 hour of going to bed. If you do, make sure there is a blue light filter (nighttime mode) involved. Try to go to bed around the same time every night. Wake up at the same time within 1 hour of regular time. Ex: If you wake up at 7 AM for work, do not sleep past 8 AM on days that you don't work. Do not drink alcohol before bedtime. Do not consume caffeine-containing beverages after noon or within 9 hours of intended bedtime. Get regular exercise/physical activity in your life, but not within 2 hours of planned bedtime. Do not take naps.  Do not eat within 2 hours of planned bedtime. Melatonin, 3-5 mg 30-60 minutes before planned bedtime may be helpful.  The bed should be for sleep or sex only. If after 20-30 minutes you are unable to fall asleep, get up and do something relaxing. Do this until you feel ready to go to sleep again.   Let us know if you need anything.

## 2022-07-12 ENCOUNTER — Ambulatory Visit: Payer: BC Managed Care – PPO | Admitting: Family Medicine

## 2022-07-20 DIAGNOSIS — R39198 Other difficulties with micturition: Secondary | ICD-10-CM | POA: Diagnosis not present

## 2022-07-20 DIAGNOSIS — K5900 Constipation, unspecified: Secondary | ICD-10-CM | POA: Diagnosis not present

## 2022-07-21 ENCOUNTER — Ambulatory Visit (HOSPITAL_COMMUNITY): Payer: BC Managed Care – PPO | Admitting: Psychiatry

## 2022-07-26 ENCOUNTER — Ambulatory Visit (HOSPITAL_COMMUNITY): Payer: BC Managed Care – PPO | Admitting: Psychiatry

## 2022-07-26 ENCOUNTER — Encounter (HOSPITAL_COMMUNITY): Payer: Self-pay | Admitting: Psychiatry

## 2022-07-26 VITALS — BP 128/68 | HR 94 | Temp 98.8°F | Ht 72.25 in | Wt 230.0 lb

## 2022-07-26 DIAGNOSIS — F322 Major depressive disorder, single episode, severe without psychotic features: Secondary | ICD-10-CM | POA: Diagnosis not present

## 2022-07-26 DIAGNOSIS — F411 Generalized anxiety disorder: Secondary | ICD-10-CM | POA: Diagnosis not present

## 2022-07-26 DIAGNOSIS — F4310 Post-traumatic stress disorder, unspecified: Secondary | ICD-10-CM

## 2022-07-26 MED ORDER — QUETIAPINE FUMARATE 50 MG PO TABS: 50.0000 mg | ORAL_TABLET | Freq: Every day | ORAL | 0 refills | Status: DC

## 2022-07-26 NOTE — Progress Notes (Signed)
[facility] Follow up visit  Patient Identification: LEMOYNE NESTOR MRN:  [id number] Date of Evaluation:  07/26/2022 Referral Source: primary care Chief Complaint: follow up depression, PTSD Visit Diagnosis:    ICD-10-CM   1. Depression, major, single episode, severe ([facility])  F32.2     2. GAD (generalized anxiety disorder)  F41.1     3. 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 Traumatic events modifying factors:church group  Duration 5 plus years   Past Psychiatric History: depression, PTSD  Previous Psychotropic Medications: No   Substance Abuse History in the last 12 months:  Yes.    Consequences of Substance Abuse: Last use amphetamine more then an year, understands the risk of paranoia,hallucinations and iimpaired judjemenmt  Past Medical History:  Past Medical History:  Diagnosis Date   Anxiety    Depression    GERD (gastroesophageal reflux disease)    Heart murmur    History of chicken pox    Hyperlipidemia    Hypertension    PTSD (post-traumatic stress disorder)    Sleep apnea    Tinnitus of both ears      Past Surgical History:  Procedure Laterality Date   broken fingers     HERNIA REPAIR      Family Psychiatric History: father: alcohol use  Family History:  Family History  Problem Relation Age of Onset   Diabetes Mother    Hyperlipidemia Mother    Hypertension Mother    Heart disease Mother    Cancer Father    Hyperlipidemia Father    Hypertension Father    Other Father        kidney removal   Heart disease Father    Kidney disease Father    Cancer Sister    Hypertension Sister    Colon cancer Neg Hx    Esophageal cancer Neg Hx    Rectal cancer Neg Hx    Stomach cancer Neg Hx     Social History:   Social History   Socioeconomic History   Marital status: Married    Spouse name: Not on file   Number of children: 1   Years of education: Not on file   Highest education level: Not on file  Occupational History   Not on file  Tobacco Use   Smoking status: Every Day    Packs/day: 1.00    Years: 2.00    Total pack years: 2.00    Types: Cigarettes    Last attempt to quit: 09/19/1996    Years since quitting: 25.8  Smokeless tobacco: Never   Tobacco comments:    Quit for 20 years and restarted 2 years ago.    Vaping Use   Vaping Use: Never used  Substance and Sexual Activity   Alcohol use: Not Currently    Comment: Reports drinks 3-4 per day.   Drug use: No   Sexual activity: Not Currently  Other Topics Concern   Not on file  Social History Narrative   Not on file   Social Determinants of Health   Financial Resource Strain: Not on file  Food Insecurity: Not on file  Transportation Needs: Not on file  Physical Activity: Not on file  Stress: Not on file  Social Connections: Not on file     Allergies:  No Known Allergies  Metabolic Disorder Labs: Lab Results  Component Value Date   HGBA1C 5.8 03/23/2022   MPG 120 (H) 12/30/2011   No results found for: "PROLACTIN" Lab Results  Component Value Date   CHOL 97 03/23/2022   TRIG 57.0 03/23/2022    HDL 41.10 03/23/2022   CHOLHDL 2 03/23/2022   VLDL 11.4 03/23/2022   LDLCALC 45 03/23/2022   LDLCALC 53 02/02/2022   Lab Results  Component Value Date   TSH 1.15 01/03/2022    Therapeutic Level Labs: No results found for: "LITHIUM" No results found for: "CBMZ" No results found for: "VALPROATE"  Current Medications: Current Outpatient Medications  Medication Sig Dispense Refill   amLODipine (NORVASC) 5 MG tablet TAKE 1 TABLET (5 MG TOTAL) BY MOUTH DAILY. 90 tablet 1   ARIPiprazole (ABILIFY) 30 MG tablet Take 1 tablet (30 mg total) by mouth daily. Delete prior refills of abilify 90 tablet 0   busPIRone (BUSPAR) 10 MG tablet TAKE 1 TABLET BY MOUTH TWICE A DAY 180 tablet 1   insulin glargine (LANTUS) 100 UNIT/ML Solostar Pen Inject 30 Units into the skin at bedtime. 15 mL 11   lamoTRIgine (LAMICTAL) 100 MG tablet Take 1 tablet (100 mg total) by mouth daily. Take 1 tablet (100mg ) daily 90 tablet 0   Lancets (ONETOUCH DELICA PLUS 123XX123) MISC Apply topically 3 (three) times daily.     metFORMIN (GLUCOPHAGE) 1000 MG tablet Take 1 tablet (1,000 mg total) by mouth 2 (two) times daily with a meal. 180 tablet 3   QUEtiapine (SEROQUEL) 50 MG tablet Take 1 tablet (50 mg total) by mouth at bedtime. 30 tablet 0   rosuvastatin (CRESTOR) 20 MG tablet TAKE 1 TABLET BY MOUTH EVERY DAY 90 tablet 3   Semaglutide, 2 MG/DOSE, 8 MG/3ML SOPN Inject 2 mg as directed once a week. 3 mL 5   tamsulosin (FLOMAX) 0.4 MG CAPS capsule Take 0.4 mg by mouth daily.     traZODone (DESYREL) 50 MG tablet Take 0.5-1 tablets (25-50 mg total) by mouth at bedtime as needed for sleep. 30 tablet 3   valsartan (DIOVAN) 160 MG tablet TAKE 1 TABLET BY MOUTH EVERY DAY 90 tablet 1   BD PEN NEEDLE NANO 2ND GEN 32G X 4 MM MISC USE WITH INSULIN PEN AS DIRECTED (Patient not taking: Reported on 07/26/2022) 200 each 2   glucose blood (ONETOUCH VERIO) test strip Use to check blood sugar 3 time(s) daily. (Patient not taking: Reported on  07/26/2022)     potassium chloride (KLOR-CON 10) 10 MEQ tablet Take 1 tablet (10 mEq total) by mouth daily for 5 days. 5 tablet 0   No current facility-administered medications for this visit.     Psychiatric Specialty Exam: Review of  Systems  Cardiovascular:  Negative for chest pain.  Skin:  Negative for rash.  Neurological:  Negative for tremors.  Psychiatric/Behavioral:  Positive for hallucinations. Negative for self-injury. The patient is nervous/anxious.     Blood pressure 128/68, pulse 94, temperature 98.8 F (37.1 C), height 6' 0.25" (1.835 m), weight 230 lb (104.3 kg), SpO2 98 %.Body mass index is 30.98 kg/m.  General Appearance: casual  Eye Contact:  fair  Speech:  Normal Rate  Volume:  Normal  Mood: gets stressed  Affect:  congruent  Thought Process:  Coherent  Orientation:  Full (Time, Place, and Person)  Thought Content:  Rumination  Suicidal Thoughts:  No  Homicidal Thoughts:  No  Memory:  Recent;   Fair  Judgement:  Fair  Insight:  Shallow  Psychomotor Activity:  Normal  Concentration:  Attention Span: Fair  Recall:  [employer] of Knowledge:Good  Language: Good  Akathisia:  No  Handed:    AIMS (if indicated):  no involunatary movements  Assets:  Desire for Improvement  ADL's:  Intact  Cognition: WNL  Sleep:   irregular   Screenings: Camera operator Row Office Visit from 07/11/2022 in [facility] at [facility] Visit from 03/23/2022 in [facility] at [facility] Visit from 12/22/2021 in [facility] at [facility] from 04/29/2021 in [facility] Office Visit from 03/16/2021 in [facility]  PHQ-2 Total Score 3 3 1 2 2   PHQ-9 Total Score 8 10 4 13 13       [place] Office Visit from 06/16/2022 in [facility] Office Visit from  04/12/2022 in [facility] Office Visit from 11/25/2021 in [facility] No Risk No Risk No Risk       Assessment and Plan: as follows  Prior documentation reviewed   PTSD chronic severe : triggers remind him of trauma, continue buspar, therapy Will add seroquel for the hallucinations, already on abilify  Understands he would be on 2 atypical antipscyhotics but need for stability major depressive recurrent severe; rule out bipolar depression Endorsing AH, subdued at times, continue abilify, add seroquel 50mg  at night, can hold offf trazadone at night  GAD:  gets anxious, continue buspar, work in therapy Alcohol dependence: has stopped now sober 2 months   Fu 4 -6w Direct care time spent in office 20 minutes  ncluding face to face , chart review and doucmentation   Merian Capron, MD 11/7/20233:17 PM

## 2022-08-02 ENCOUNTER — Other Ambulatory Visit: Payer: Self-pay | Admitting: Family Medicine

## 2022-08-02 DIAGNOSIS — G4700 Insomnia, unspecified: Secondary | ICD-10-CM

## 2022-08-02 DIAGNOSIS — F411 Generalized anxiety disorder: Secondary | ICD-10-CM

## 2022-08-10 ENCOUNTER — Ambulatory Visit: Payer: BC Managed Care – PPO | Admitting: Family Medicine

## 2022-08-17 ENCOUNTER — Ambulatory Visit: Payer: BC Managed Care – PPO | Admitting: Family Medicine

## 2022-08-17 ENCOUNTER — Other Ambulatory Visit (HOSPITAL_BASED_OUTPATIENT_CLINIC_OR_DEPARTMENT_OTHER): Payer: Self-pay

## 2022-08-17 ENCOUNTER — Encounter: Payer: Self-pay | Admitting: Family Medicine

## 2022-08-17 VITALS — BP 132/80 | HR 80 | Temp 98.6°F | Ht 72.0 in | Wt 242.5 lb

## 2022-08-17 DIAGNOSIS — Z794 Long term (current) use of insulin: Secondary | ICD-10-CM

## 2022-08-17 DIAGNOSIS — E1165 Type 2 diabetes mellitus with hyperglycemia: Secondary | ICD-10-CM

## 2022-08-17 DIAGNOSIS — G4700 Insomnia, unspecified: Secondary | ICD-10-CM

## 2022-08-17 DIAGNOSIS — I10 Essential (primary) hypertension: Secondary | ICD-10-CM | POA: Diagnosis not present

## 2022-08-17 LAB — LIPID PANEL
Cholesterol: 137 mg/dL (ref 0–200)
HDL: 42.6 mg/dL (ref >39.00)
LDL Cholesterol: 77 mg/dL (ref 0–99)
NonHDL: 94.16
Total CHOL/HDL Ratio: 3
Triglycerides: 85 mg/dL (ref 0.0–149.0)
VLDL: 17 mg/dL (ref 0.0–40.0)

## 2022-08-17 LAB — COMPREHENSIVE METABOLIC PANEL
ALT: 19 U/L (ref 0–53)
AST: 15 U/L (ref 0–37)
Albumin: 4.6 g/dL (ref 3.5–5.2)
Alkaline Phosphatase: 47 U/L (ref 39–117)
BUN: 11 mg/dL (ref 6–23)
CO2: 28 mEq/L (ref 19–32)
Calcium: 9.1 mg/dL (ref 8.4–10.5)
Chloride: 104 mEq/L (ref 96–112)
Creatinine, Ser: 0.81 mg/dL (ref 0.40–1.50)
GFR: 102.87 mL/min (ref >60.00)
Glucose, Bld: 71 mg/dL (ref 70–99)
Potassium: 4.1 mEq/L (ref 3.5–5.1)
Sodium: 141 mEq/L (ref 135–145)
Total Bilirubin: 0.4 mg/dL (ref 0.2–1.2)
Total Protein: 7.2 g/dL (ref 6.0–8.3)

## 2022-08-17 LAB — HEMOGLOBIN A1C: Hgb A1c MFr Bld: 5.5 % (ref 4.6–6.5)

## 2022-08-17 LAB — MICROALBUMIN / CREATININE URINE RATIO
Creatinine,U: 40.5 mg/dL
Microalb Creat Ratio: 1.7 mg/g (ref 0.0–30.0)
Microalb, Ur: <0.7 mg/dL (ref 0.0–1.9)

## 2022-08-17 MED ORDER — SEMAGLUTIDE (2 MG/DOSE) 8 MG/3ML ~~LOC~~ SOPN
2.0000 mg | PEN_INJECTOR | SUBCUTANEOUS | 2 refills | Status: DC
  Filled 2022-08-17: qty 3, 28d supply, fill #0

## 2022-08-17 NOTE — Patient Instructions (Signed)
Give us 2-3 business days to get the results of your labs back.   Keep the diet clean and stay active.  Let us know if you need anything. 

## 2022-08-17 NOTE — Progress Notes (Signed)
Subjective:   Chief Complaint  Patient presents with   Follow-up    Aaron [name] is a 51 y.o. male here for follow-up of diabetes.   Aaron [name] does not monitor his sugars routinely.  Patient does not require insulin.   Medications include: Ozempic 2 mg/week, Metformin 1000 mg bid.  Diet is healthy.  Exercise: active at work  Hypertension Patient presents for hypertension follow up. He does monitor home blood pressures. He is compliant with medications- valsartan 160 mg/d, Norvasc 5 mg/d. Patient has these side effects of medication: none Diet/exercises as above.  No Cp or SOB.   Past Medical History:  Diagnosis Date   Anxiety    Depression    GERD (gastroesophageal reflux disease)    Heart murmur    History of chicken pox    Hyperlipidemia    Hypertension    PTSD (post-traumatic stress disorder)    Sleep apnea    Tinnitus of both ears      Related testing: Retinal exam: Done Pneumovax: done  Objective:  BP 132/80 (BP Location: Left Arm, Patient Position: Sitting, Cuff Size: Normal)   Pulse 80   Temp 98.6 F (37 C) (Oral)   Ht 6' (1.829 m)   Wt 242 lb 8 oz (110 kg)   SpO2 96%   BMI 32.89 kg/m  General:  Well developed, well nourished, in no apparent distress Lungs:  CTAB, no access msc use Cardio:  RRR, no bruits, no LE edema Psych: Age appropriate judgment and insight  Assessment:   Type 2 diabetes mellitus with hyperglycemia, with long-term current use of insulin (HCC) - Plan: Lipid panel, Microalbumin / creatinine urine ratio, Comprehensive metabolic panel, Hemoglobin A1c  Essential hypertension  Insomnia, unspecified type   Plan:   Chronic, stable. Cont Ozempic 2 mg/week, Metformin 1000 mg bid. Counseled on diet and exercise. Chronic, stable. Cont valsartan 160 mg/d, Norasc 5 mg/d.  F/u in 6 mo. The patient voiced understanding and agreement to the plan.  [name] [place], DO 08/17/22 1:07 PM

## 2022-08-21 ENCOUNTER — Other Ambulatory Visit (HOSPITAL_COMMUNITY): Payer: Self-pay | Admitting: Psychiatry

## 2022-08-25 ENCOUNTER — Other Ambulatory Visit (HOSPITAL_BASED_OUTPATIENT_CLINIC_OR_DEPARTMENT_OTHER): Payer: Self-pay

## 2022-09-02 ENCOUNTER — Ambulatory Visit (HOSPITAL_COMMUNITY): Payer: BC Managed Care – PPO | Admitting: Licensed Clinical Social Worker

## 2022-09-09 ENCOUNTER — Other Ambulatory Visit (HOSPITAL_COMMUNITY): Payer: Self-pay | Admitting: Psychiatry

## 2022-09-26 ENCOUNTER — Ambulatory Visit: Payer: BC Managed Care – PPO | Admitting: Family Medicine

## 2022-09-27 ENCOUNTER — Encounter (HOSPITAL_COMMUNITY): Payer: Self-pay | Admitting: Psychiatry

## 2022-09-27 ENCOUNTER — Ambulatory Visit (HOSPITAL_COMMUNITY): Payer: BC Managed Care – PPO | Admitting: Psychiatry

## 2022-09-27 VITALS — BP 154/83 | HR 84 | Ht 72.0 in | Wt 245.0 lb

## 2022-09-27 DIAGNOSIS — F322 Major depressive disorder, single episode, severe without psychotic features: Secondary | ICD-10-CM | POA: Diagnosis not present

## 2022-09-27 DIAGNOSIS — F4310 Post-traumatic stress disorder, unspecified: Secondary | ICD-10-CM

## 2022-09-27 DIAGNOSIS — Z789 Other specified health status: Secondary | ICD-10-CM | POA: Diagnosis not present

## 2022-09-27 DIAGNOSIS — F411 Generalized anxiety disorder: Secondary | ICD-10-CM | POA: Diagnosis not present

## 2022-09-27 DIAGNOSIS — F1090 Alcohol use, unspecified, uncomplicated: Secondary | ICD-10-CM

## 2022-09-27 MED ORDER — QUETIAPINE FUMARATE 100 MG PO TABS: 100.0000 mg | ORAL_TABLET | Freq: Every day | ORAL | 0 refills | Status: DC
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
## 2022-10-02 ENCOUNTER — Other Ambulatory Visit: Payer: Self-pay | Admitting: Family Medicine

## 2022-10-02 ENCOUNTER — Other Ambulatory Visit (HOSPITAL_COMMUNITY): Payer: Self-pay | Admitting: Psychiatry

## 2022-10-02 DIAGNOSIS — E1165 Type 2 diabetes mellitus with hyperglycemia: Secondary | ICD-10-CM

## 2022-10-03 ENCOUNTER — Encounter: Payer: Self-pay | Admitting: Gastroenterology

## 2022-10-06 ENCOUNTER — Other Ambulatory Visit (HOSPITAL_COMMUNITY): Payer: Self-pay

## 2022-10-06 DIAGNOSIS — F4310 Post-traumatic stress disorder, unspecified: Secondary | ICD-10-CM

## 2022-10-06 DIAGNOSIS — F322 Major depressive disorder, single episode, severe without psychotic features: Secondary | ICD-10-CM

## 2022-10-06 MED ORDER — ARIPIPRAZOLE 30 MG PO TABS: 30.0000 mg | ORAL_TABLET | Freq: Every day | ORAL | 0 refills | Status: DC

## 2022-10-23 ENCOUNTER — Other Ambulatory Visit (HOSPITAL_COMMUNITY): Payer: Self-pay | Admitting: Psychiatry

## 2022-10-26 ENCOUNTER — Ambulatory Visit (HOSPITAL_COMMUNITY): Payer: BC Managed Care – PPO | Admitting: Licensed Clinical Social Worker

## 2022-10-26 DIAGNOSIS — F4310 Post-traumatic stress disorder, unspecified: Secondary | ICD-10-CM | POA: Diagnosis not present

## 2022-10-26 NOTE — Progress Notes (Unsigned)
THERAPIST PROGRESS NOTE  Session Time: 3:00 pm-3:45 pm  Type of Therapy: Individual Therapy  Purpose of Session: [place] will manage mood and anxiety as evidenced by regulating physical responses to stressors and trauma, and cope with daily stressors for 5 out of 7 days for 60 days.  ProgressTowards Goals: Progressing  Interventions: Therapist utilized CBT and Solution focused brief therapy to address mood and anxiety. Therapist provided support and empathy to patient during session. Therapist administered a PHQ9 and GAD7 to patient. Therapist challenged patient's thoughts/perception and encouraged patient to face some of what he has been avoiding.   Effectiveness: Patient was oriented x4 (person, place, situation, and time). Patient was casually dressed, and appropriately groomed. Patient was alert, engaged, anxious and depressed, and cooperative. Patient completed a PHQ9 with a score of 15 indicating severe depressive symptoms. Patient completed a GAD7 with a score of 10 indicating moderate anxiety. Patient continues to struggle with guilt over his wife. He has stopped drinking but feels like nothing has changed. He feels like nothing is working. After discussion, patient understood that he has been avoiding his difficult feelings and has  has felt stuck with his mood, etc. Patient has avoiding feeling anything that is difficult or may trigger tears. Patient continues to hear voices but views it as spiritual warfare.   Patient engaged in session. Patient responded well to interventions. Patient continues to meet criteria for PTSD. Patient will continue in outpatient therapy due to being the least restrictive service to meet his needs. Patient made minimal progress on his goals.   Suicidal/Homicidal: Nowithout intent/plan  Plan: Return again in 2-4 weeks.   Diagnosis: PTSD (post-traumatic stress disorder)  Collaboration of Care: Psychiatrist  AEB reviewing documentation and discussing as needed the progress of patient with Dr. Gilmore Laroche  Patient/Guardian was advised Release of Information must be obtained prior to any record release in order to collaborate their care with an outside provider. Patient/Guardian was advised if they have not already done so to contact the registration department to sign all necessary forms in order for [place] to release information regarding their care.   Consent: Patient/Guardian gives verbal consent for treatment and assignment of benefits for services provided during this visit. Patient/Guardian expressed understanding and agreed to proceed.   Bynum Bellows, LCSW 10/27/2022

## 2022-11-22 ENCOUNTER — Ambulatory Visit (HOSPITAL_COMMUNITY): Payer: BC Managed Care – PPO | Admitting: Psychiatry

## 2022-11-24 ENCOUNTER — Other Ambulatory Visit (HOSPITAL_BASED_OUTPATIENT_CLINIC_OR_DEPARTMENT_OTHER): Payer: Self-pay

## 2022-11-24 ENCOUNTER — Telehealth: Payer: Self-pay

## 2022-11-24 MED ORDER — SEMAGLUTIDE (2 MG/DOSE) 8 MG/3ML ~~LOC~~ SOPN
2.0000 mg | PEN_INJECTOR | SUBCUTANEOUS | 3 refills | Status: DC
  Filled 2022-11-24: qty 3, 28d supply, fill #0

## 2022-11-24 NOTE — Telephone Encounter (Deleted)
PA initiated via Covermymeds; KEY: [id number]. Awaiting determination.

## 2022-11-24 NOTE — Telephone Encounter (Signed)
Error

## 2022-11-30 ENCOUNTER — Ambulatory Visit (HOSPITAL_COMMUNITY): Payer: BC Managed Care – PPO | Admitting: Licensed Clinical Social Worker

## 2022-11-30 DIAGNOSIS — F4310 Post-traumatic stress disorder, unspecified: Secondary | ICD-10-CM | POA: Diagnosis not present

## 2022-12-01 ENCOUNTER — Other Ambulatory Visit (HOSPITAL_BASED_OUTPATIENT_CLINIC_OR_DEPARTMENT_OTHER): Payer: Self-pay
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
## 2022-12-13 ENCOUNTER — Encounter (HOSPITAL_COMMUNITY): Payer: Self-pay | Admitting: Psychiatry

## 2022-12-13 ENCOUNTER — Ambulatory Visit (HOSPITAL_COMMUNITY): Payer: BC Managed Care – PPO | Admitting: Psychiatry

## 2022-12-13 VITALS — BP 190/89 | HR 90 | Ht 72.0 in | Wt 256.0 lb

## 2022-12-13 DIAGNOSIS — F4310 Post-traumatic stress disorder, unspecified: Secondary | ICD-10-CM | POA: Diagnosis not present

## 2022-12-13 DIAGNOSIS — F411 Generalized anxiety disorder: Secondary | ICD-10-CM

## 2022-12-13 DIAGNOSIS — F322 Major depressive disorder, single episode, severe without psychotic features: Secondary | ICD-10-CM

## 2022-12-13 MED ORDER — BUSPIRONE HCL 10 MG PO TABS: 10.0000 mg | ORAL_TABLET | Freq: Two times a day (BID) | ORAL | 0 refills | Status: DC

## 2022-12-13 MED ORDER — LAMOTRIGINE 100 MG PO TABS: 100.0000 mg | ORAL_TABLET | Freq: Every day | ORAL | 0 refills | Status: DC

## 2022-12-13 MED ORDER — QUETIAPINE FUMARATE 100 MG PO TABS: 100.0000 mg | ORAL_TABLET | Freq: Every day | ORAL | 0 refills | Status: DC

## 2022-12-13 MED ORDER — ARIPIPRAZOLE 30 MG PO TABS: 30.0000 mg | ORAL_TABLET | Freq: Every day | ORAL | 0 refills | Status: DC

## 2022-12-13 NOTE — Progress Notes (Signed)
[facility] Follow up visit  Patient Identification: Aaron [name] MRN:  [id number] Date of Evaluation:  12/13/2022 Referral Source: primary care Chief Complaint: follow up depression, PTSD Visit Diagnosis:    ICD-10-CM   1. PTSD (post-traumatic stress disorder)  F43.10 ARIPiprazole (ABILIFY) 30 MG tablet    2. Depression, major, single episode, severe (HCC)  F32.2 ARIPiprazole (ABILIFY) 30 MG tablet    3. 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 Traumatic events modifying factors : church group  Duration 5 plus years Severity baseline  Past Psychiatric History: depression, PTSD  Previous Psychotropic Medications: No   Substance Abuse History in the last 12 months:  Yes.    Consequences of Substance Abuse: Last use amphetamine more then an year, understands the risk of paranoia,hallucinations and iimpaired judjemenmt  Past Medical History:  Past Medical History:  Diagnosis Date   Anxiety    Depression    GERD (gastroesophageal reflux disease)    Heart murmur    History of chicken pox    Hyperlipidemia    Hypertension    PTSD (post-traumatic stress disorder)    Sleep apnea    Tinnitus of both ears     Past Surgical History:  Procedure Laterality Date   broken fingers     HERNIA REPAIR       Family Psychiatric History: father: alcohol use  Family History:  Family History  Problem Relation Age of Onset   Diabetes Mother    Hyperlipidemia Mother    Hypertension Mother    Heart disease Mother    Cancer Father    Hyperlipidemia Father    Hypertension Father    Other Father        kidney removal   Heart disease Father    Kidney disease Father    Cancer Sister    Hypertension Sister    Colon cancer Neg Hx    Esophageal cancer Neg Hx    Rectal cancer Neg Hx    Stomach cancer Neg Hx     Social History:   Social History   Socioeconomic History   Marital status: Married    Spouse name: Not on file   Number of children: 1   Years of education: Not on file   Highest education level: Not on file  Occupational History   Not on file  Tobacco Use   Smoking status: Every Day    Packs/day: 1.00    Years: 2.00    Additional pack years: 0.00    Total pack years: 2.00    Types: Cigarettes    Last attempt to quit: 09/19/1996    Years since quitting: 26.2   Smokeless tobacco: Never   Tobacco comments:    Quit for 20 years and restarted 2 years ago.    Vaping  Use   Vaping Use: Never used  Substance and Sexual Activity   Alcohol use: Not Currently    Comment: Reports drinks 3-4 per day.   Drug use: No   Sexual activity: Not Currently  Other Topics Concern   Not on file  Social History Narrative   Not on file   Social Determinants of Health   Financial Resource Strain: Not on file  Food Insecurity: Not on file  Transportation Needs: Not on file  Physical Activity: Not on file  Stress: Not on file  Social Connections: Not on file     Allergies:  No Known Allergies  Metabolic Disorder Labs: Lab Results  Component Value Date   HGBA1C 5.5 08/17/2022   MPG 120 (H) 12/30/2011   No results found for: "PROLACTIN" Lab Results  Component Value Date   CHOL 137 08/17/2022   TRIG 85.0 08/17/2022   HDL 42.60 08/17/2022   CHOLHDL 3 08/17/2022   VLDL 17.0  08/17/2022   LDLCALC 77 08/17/2022   LDLCALC 45 03/23/2022   Lab Results  Component Value Date   TSH 1.15 01/03/2022    Therapeutic Level Labs: No results found for: "LITHIUM" No results found for: "CBMZ" No results found for: "VALPROATE"  Current Medications: Current Outpatient Medications  Medication Sig Dispense Refill   amLODipine (NORVASC) 5 MG tablet TAKE 1 TABLET (5 MG TOTAL) BY MOUTH DAILY. 90 tablet 1   BD PEN NEEDLE NANO 2ND GEN 32G X 4 MM MISC USE WITH INSULIN PEN AS DIRECTED 200 each 2   insulin glargine (LANTUS) 100 UNIT/ML Solostar Pen Inject 30 Units into the skin at bedtime. 15 mL 11   Lancets (ONETOUCH DELICA PLUS 123XX123) MISC Apply topically 3 (three) times daily.     metFORMIN (GLUCOPHAGE) 1000 MG tablet TAKE 1 TABLET (1,000 MG TOTAL) BY MOUTH TWICE A DAY WITH FOOD 180 tablet 3   rosuvastatin (CRESTOR) 20 MG tablet TAKE 1 TABLET BY MOUTH EVERY DAY 90 tablet 3   Semaglutide, 2 MG/DOSE, 8 MG/3ML SOPN Inject 2 mg as directed once a week. 3 mL 3   tamsulosin (FLOMAX) 0.4 MG CAPS capsule Take 0.4 mg by mouth daily.     valsartan (DIOVAN) 160 MG tablet TAKE 1 TABLET BY MOUTH EVERY DAY 90 tablet 1   ARIPiprazole (ABILIFY) 30 MG tablet Take 1 tablet (30 mg total) by mouth daily. Delete prior refills of abilify 90 tablet 0   busPIRone (BUSPAR) 10 MG tablet Take 1 tablet (10 mg total) by mouth 2 (two) times daily. 180 tablet 0   lamoTRIgine (LAMICTAL) 100 MG tablet Take 1 tablet (100 mg total) by mouth daily. 90 tablet 0   potassium chloride (KLOR-CON 10) 10 MEQ tablet Take 1 tablet (10 mEq total) by mouth daily for 5 days. 5 tablet 0   QUEtiapine (SEROQUEL) 100 MG tablet Take 1 tablet (100 mg total) by mouth at bedtime. 30 tablet 0   No current facility-administered medications for this visit.     Psychiatric Specialty Exam: Review of Systems  Cardiovascular:  Negative for chest pain.  Skin:  Negative for rash.  Neurological:  Negative for tremors.   Psychiatric/Behavioral:  Positive for hallucinations. Negative for self-injury.     Blood pressure (!) 190/89, pulse 90, height 6' (1.829 m), weight 256 lb (116.1 kg).Body mass index is 34.72 kg/m.  General Appearance: casual  Eye Contact:  fair  Speech:  Normal Rate  Volume:  Normal  Mood: gets stressed  Affect:  congruent  Thought Process:  Coherent  Orientation:  Full (Time, Place, and Person)  Thought Content:  Rumination  Suicidal Thoughts:  No  Homicidal Thoughts:  No  Memory:  Recent;   Fair  Judgement:  Fair  Insight:  Shallow  Psychomotor Activity:  Normal  Concentration:  Attention Span: Fair  Recall:  [employer] of Knowledge:Good  Language: Good  Akathisia:  No  Handed:    AIMS (if indicated):  no involunatary movements  Assets:  Desire for Improvement  ADL's:  Intact  Cognition: WNL  Sleep:   irregular   Screenings: PHQ2-9    Flowsheet Row Office Visit from 07/11/2022 in [facility] [place] Visit from 03/23/2022 in [facility] at [place] Visit from 12/22/2021 in [facility] at [place] from 04/29/2021 in [facility] at [place] Visit from 03/16/2021 in [facility] at [facility]  PHQ-2 Total Score 3 3 1 2 2   PHQ-9 Total Score 8 10 4 13 13       [facility] Office Visit from 06/16/2022 in [facility] at [place] Visit from 04/12/2022 in [facility] at [place] Visit from 11/25/2021 in [facility] at [place] No Risk No Risk No Risk       Assessment and Plan: as follows  Prior documentation reviewed   PTSD chronic severe : triggers from past he deals with daily, continue therapy and  distraction     Understands he would be on 2 atypical antipscyhotics but need for stability major depressive recurrent severe; rule out bipolar depression AH some better with 2 atypical , understands the risk ,   GAD:  stress about circumstance, continue buspar, helps aldo distraction and therapy Alcohol dependence: was sober for 4 months but now drinking 2 a day, understands the risk and abstinence needed     Fu 6 - 8 weeks or earlier if needed Direct care time spent in office 20 minutes  ncluding face to face , chart review and doucmentation   Merian Capron, MD 3/26/20242:58 PM

## 2022-12-28 ENCOUNTER — Encounter: Payer: Self-pay | Admitting: Family Medicine

## 2022-12-28 ENCOUNTER — Ambulatory Visit: Payer: BC Managed Care – PPO | Admitting: Family Medicine

## 2022-12-28 VITALS — BP 134/84 | HR 87 | Temp 97.7°F | Ht 72.0 in | Wt 257.1 lb

## 2022-12-28 DIAGNOSIS — R0781 Pleurodynia: Secondary | ICD-10-CM

## 2022-12-28 DIAGNOSIS — R55 Syncope and collapse: Secondary | ICD-10-CM

## 2022-12-28 LAB — COMPREHENSIVE METABOLIC PANEL
ALT: 30 U/L (ref 0–53)
AST: 22 U/L (ref 0–37)
Albumin: 4.1 g/dL (ref 3.5–5.2)
Alkaline Phosphatase: 47 U/L (ref 39–117)
BUN: 15 mg/dL (ref 6–23)
CO2: 26 mEq/L (ref 19–32)
Calcium: 9 mg/dL (ref 8.4–10.5)
Chloride: 110 mEq/L (ref 96–112)
Creatinine, Ser: 0.85 mg/dL (ref 0.40–1.50)
GFR: 101.13 mL/min (ref >60.00)
Glucose, Bld: 93 mg/dL (ref 70–99)
Potassium: 4.7 mEq/L (ref 3.5–5.1)
Sodium: 144 mEq/L (ref 135–145)
Total Bilirubin: 0.3 mg/dL (ref 0.2–1.2)
Total Protein: 6.7 g/dL (ref 6.0–8.3)

## 2022-12-28 LAB — CBC
HCT: 40.6 % (ref 39.0–52.0)
Hemoglobin: 13.5 g/dL (ref 13.0–17.0)
MCHC: 33.2 g/dL (ref 30.0–36.0)
MCV: 92.7 fl (ref 78.0–100.0)
Platelets: 381 10*3/uL (ref 150.0–400.0)
RBC: 4.38 Mil/uL (ref 4.22–5.81)
RDW: 15 % (ref 11.5–15.5)
WBC: 6.1 10*3/uL (ref 4.0–10.5)

## 2022-12-28 LAB — TSH: TSH: 0.42 u[IU]/mL (ref 0.35–5.50)

## 2022-12-28 NOTE — Progress Notes (Signed)
Musculoskeletal Exam  Patient: Aaron [name] DOB: October 18, 1970  DOS: 12/28/2022  SUBJECTIVE:  Chief Complaint:   Chief Complaint  Patient presents with   Fall    Rib pain     Aaron [name] is a 52 y.o.  male for evaluation and treatment of rib pain.   Onset:  3 days ago. Fell while walking to the bedroom.  Location: L side of ribs Character:  sharp  Progression of issue:  has slightly improved Associated symptoms: pain when taking a deep breath No redness, bruising, swelling, fevers, coughing Treatment: to date has been acetaminophen.   Neurovascular symptoms: no  Syncope 3 days ago the patient did lose consciousness.  He saw spots in his visual fields while walking to the restroom.  He had recently eaten dinner 1 hour prior.  He went to the restroom and the same thing happened while he was sitting on the toilet.  He drinks around 3-4 beers nightly or maybe every other night.  He did not feel particularly positive or intoxicated.  He does stay well-hydrated.  He did not check his blood pressure.  His sugar was 134 at the time.  He denies any chest pain, shortness of breath, vision changes, weakness, paresthesias, balance issues, lightheadedness.  He has not had any issues prior to or since these episodes.  Past Medical History:  Diagnosis Date   Anxiety    Depression    GERD (gastroesophageal reflux disease)    Heart murmur    History of chicken pox    Hyperlipidemia    Hypertension    PTSD (post-traumatic stress disorder)    Sleep apnea    Tinnitus of both ears     Objective: VITAL SIGNS: BP 134/84 (BP Location: Left Arm, Cuff Size: Large)   Pulse 87   Temp 97.7 F (36.5 C) (Oral)   Ht 6' (1.829 m)   Wt 257 lb 2 oz (116.6 kg)   SpO2 98%   BMI 34.87 kg/m  Constitutional: Well formed, well developed. No acute distress. Thorax & Lungs: CTAB. No accessory muscle use Musculoskeletal: Side.   Tenderness to palpation: yes- between mid clav and ant ax line on L  around R 9-11, there is ttp without crepitus Deformity: no Ecchymosis: Yes Heart: RRR, no bruits, no LE edema Neurologic: Normal sensory function. No focal deficits noted. DTR's equal and symmetric in LE's. No clonus. Psychiatric: Normal mood. Age appropriate judgment and insight. Alert & oriented x 3.    Assessment:  Rib pain on left side  Syncope, unspecified syncope type - Plan: CBC, Comprehensive metabolic panel, TSH  Plan: Will get some balloons and blow 1 up once per hour, heat, ice, Tylenol.  Without any crepitus or deformity, he is somewhat better, and we discussed the limited utility of an x-ray.  Decided to forego this for now. Check above labs.  Stay hydrated. Ck BP routinely to ensure he is not dropping. Does not sound like hypoglycemia. If normal, will likely refer to cardiology. F/u prn. The patient voiced understanding and agreement to the plan.   Aaron [name] [place], DO 12/28/22  9:23 AM

## 2022-12-28 NOTE — Patient Instructions (Addendum)
Heat (pad or rice pillow in microwave) over affected area, 10-15 minutes twice daily.   Ice/cold pack over area for 10-15 min twice daily.  OK to take Tylenol 1000 mg (2 extra strength tabs) or 975 mg (3 regular strength tabs) every 6 hours as needed.  Please contact the GI team at: [phone]  Blow up a balloon once per hour while awake to help prevent pneumonia.  Monitor your blood pressure at home over the next couple weeks and let me know if it is low (<100/60) consistently.   Give [place] 2-3 business days to get the results of your labs back.   Stay hydrated.  Let us know if you need anything.

## 2023-01-03 ENCOUNTER — Other Ambulatory Visit: Payer: Self-pay | Admitting: Family Medicine

## 2023-01-03 DIAGNOSIS — I10 Essential (primary) hypertension: Secondary | ICD-10-CM

## 2023-01-17 ENCOUNTER — Other Ambulatory Visit (HOSPITAL_COMMUNITY): Payer: Self-pay | Admitting: Psychiatry

## 2023-01-23 ENCOUNTER — Encounter: Payer: Self-pay | Admitting: Family Medicine

## 2023-01-25 ENCOUNTER — Ambulatory Visit: Payer: BC Managed Care – PPO | Admitting: Family Medicine

## 2023-01-25 ENCOUNTER — Encounter: Payer: Self-pay | Admitting: Family Medicine

## 2023-01-25 VITALS — BP 154/78 | HR 98 | Temp 98.2°F | Ht 72.0 in | Wt 256.5 lb

## 2023-01-25 DIAGNOSIS — R030 Elevated blood-pressure reading, without diagnosis of hypertension: Secondary | ICD-10-CM | POA: Diagnosis not present

## 2023-01-25 DIAGNOSIS — F411 Generalized anxiety disorder: Secondary | ICD-10-CM

## 2023-01-25 DIAGNOSIS — N529 Male erectile dysfunction, unspecified: Secondary | ICD-10-CM | POA: Insufficient documentation

## 2023-01-25 DIAGNOSIS — Z1211 Encounter for screening for malignant neoplasm of colon: Secondary | ICD-10-CM

## 2023-01-25 MED ORDER — PROPRANOLOL HCL 10 MG PO TABS
ORAL_TABLET | ORAL | 1 refills | Status: DC
Start: 2023-01-25 — End: 2024-01-15

## 2023-01-25 MED ORDER — TADALAFIL 20 MG PO TABS
10.0000 mg | ORAL_TABLET | ORAL | 2 refills | Status: DC | PRN
Start: 2023-01-25 — End: 2024-02-06

## 2023-01-25 NOTE — Progress Notes (Signed)
Chief Complaint  Patient presents with   Erectile Dysfunction    Subjective: Patient is a 52 y.o. male here for ED.  Patient has a several year history of erectile dysfunction.  He has trouble with maintaining and obtaining an erection.  He was on Cialis in the past which did seem to help.  No adverse effects that he remembers.  He would like to try this again.  Libido is sufficient.  Patient does not monitor his blood pressure at home.  Is compliant with his medications including Diovan 160 mg daily, Norvasc 5 mg daily.  Patient continues to have anxiety.  He is following psychiatry.  He is taking Abilify 30 mg daily, BuSpar 10 mg 2 times daily, Lamictal 100 mg daily, and Seroquel 100 mg at night prescribed in the psychiatry team.  He feels like he is going to call out of his skin.  He left work today.  No homicidal or suicidal ideation.  He is still hearing voices.  No self-medication with illicit substances.  Past Medical History:  Diagnosis Date   Anxiety    Depression    GERD (gastroesophageal reflux disease)    Heart murmur    History of chicken pox    Hyperlipidemia    Hypertension    PTSD (post-traumatic stress disorder)    Sleep apnea    Tinnitus of both ears     Objective: BP (!) 154/78 (BP Location: Left Arm, Cuff Size: Large)   Pulse 98   Temp 98.2 F (36.8 C) (Oral)   Ht 6' (1.829 m)   Wt 256 lb 8 oz (116.3 kg)   SpO2 94%   BMI 34.79 kg/m  General: Awake, appears stated age Heart: RRR, no LE edema Lungs: CTAB, no rales, wheezes or rhonchi. No accessory muscle use Psych: Age appropriate judgment and insight, normal affect and mood  Assessment and Plan: GAD (generalized anxiety disorder) - Plan: propranolol (INDERAL) 10 MG tablet  Erectile dysfunction, unspecified erectile dysfunction type - Plan: tadalafil (CIALIS) 20 MG tablet  Screen for colon cancer  Elevated blood pressure reading  Chronic, uncontrolled.  Appreciate psychiatry team.  Continue  regimen with them.  We will add propranolol 10 mg 3 times daily as needed until he is seen in 20 days. Chronic, uncontrolled.  Start Cialis 20 mg daily as needed.  Recommended he use GoodRx for this. Referred to the GI team. Recheck still elevated, he will monitor blood pressure and bring his monitor to the next appointment in 3 weeks. The patient voiced understanding and agreement to the plan.  [name] [place], DO 01/25/23  4:14 PM

## 2023-01-25 NOTE — Patient Instructions (Signed)
Use GoodRx.  Aim to do some physical exertion for 150 minutes per week. This is typically divided into 5 days per week, 30 minutes per day. The activity should be enough to get your heart rate up. Anything is better than nothing if you have time constraints.  Monitor blood pressure at home, bring your device to your next appointment.   Let us know if you need anything.

## 2023-02-10 ENCOUNTER — Telehealth (HOSPITAL_COMMUNITY): Payer: Self-pay | Admitting: Psychiatry

## 2023-02-10 NOTE — Telephone Encounter (Signed)
02/14/2023 appointment needs to be rescheduled.Messages left 5/8/ and 5/23 2024 with no return call.

## 2023-02-14 ENCOUNTER — Ambulatory Visit (HOSPITAL_COMMUNITY): Payer: BC Managed Care – PPO | Admitting: Psychiatry

## 2023-02-15 ENCOUNTER — Encounter: Payer: Self-pay | Admitting: Family Medicine

## 2023-02-15 ENCOUNTER — Ambulatory Visit (INDEPENDENT_AMBULATORY_CARE_PROVIDER_SITE_OTHER): Payer: BC Managed Care – PPO | Admitting: Family Medicine

## 2023-02-15 VITALS — BP 136/72 | HR 88 | Temp 98.0°F | Ht 72.0 in | Wt 254.2 lb

## 2023-02-15 DIAGNOSIS — Z794 Long term (current) use of insulin: Secondary | ICD-10-CM

## 2023-02-15 DIAGNOSIS — Z125 Encounter for screening for malignant neoplasm of prostate: Secondary | ICD-10-CM

## 2023-02-15 DIAGNOSIS — Z0000 Encounter for general adult medical examination without abnormal findings: Secondary | ICD-10-CM | POA: Diagnosis not present

## 2023-02-15 DIAGNOSIS — E1165 Type 2 diabetes mellitus with hyperglycemia: Secondary | ICD-10-CM | POA: Diagnosis not present

## 2023-02-15 LAB — LIPID PANEL
Cholesterol: 140 mg/dL (ref 0–200)
HDL: 45.4 mg/dL (ref >39.00)
LDL Cholesterol: 75 mg/dL (ref 0–99)
NonHDL: 94.34
Total CHOL/HDL Ratio: 3
Triglycerides: 98 mg/dL (ref 0.0–149.0)
VLDL: 19.6 mg/dL (ref 0.0–40.0)

## 2023-02-15 LAB — COMPREHENSIVE METABOLIC PANEL
ALT: 19 U/L (ref 0–53)
AST: 12 U/L (ref 0–37)
Albumin: 4.1 g/dL (ref 3.5–5.2)
Alkaline Phosphatase: 53 U/L (ref 39–117)
BUN: 8 mg/dL (ref 6–23)
CO2: 27 mEq/L (ref 19–32)
Calcium: 9 mg/dL (ref 8.4–10.5)
Chloride: 106 mEq/L (ref 96–112)
Creatinine, Ser: 0.85 mg/dL (ref 0.40–1.50)
GFR: 101.03 mL/min (ref >60.00)
Glucose, Bld: 115 mg/dL — ABNORMAL HIGH (ref 70–99)
Potassium: 4.2 mEq/L (ref 3.5–5.1)
Sodium: 141 mEq/L (ref 135–145)
Total Bilirubin: 0.3 mg/dL (ref 0.2–1.2)
Total Protein: 6.9 g/dL (ref 6.0–8.3)

## 2023-02-15 LAB — CBC
HCT: 42.7 % (ref 39.0–52.0)
Hemoglobin: 14 g/dL (ref 13.0–17.0)
MCHC: 32.8 g/dL (ref 30.0–36.0)
MCV: 93.3 fl (ref 78.0–100.0)
Platelets: 375 10*3/uL (ref 150.0–400.0)
RBC: 4.58 Mil/uL (ref 4.22–5.81)
RDW: 14.2 % (ref 11.5–15.5)
WBC: 8 10*3/uL (ref 4.0–10.5)

## 2023-02-15 LAB — PSA: PSA: 0.84 ng/mL (ref 0.10–4.00)

## 2023-02-15 LAB — HEMOGLOBIN A1C: Hgb A1c MFr Bld: 5.8 % (ref 4.6–6.5)

## 2023-02-15 MED ORDER — OZEMPIC (0.25 OR 0.5 MG/DOSE) 2 MG/1.5ML ~~LOC~~ SOPN: PEN_INJECTOR | SUBCUTANEOUS | 1 refills | Status: AC

## 2023-02-15 NOTE — Progress Notes (Signed)
Chief Complaint  Patient presents with   Annual Exam    [name] is here for a complete physical.   His last physical was >1 year ago.  Current diet: in general, a "fair" diet.  Current exercise: none Weight trend: stable Fatigue out of ordinary? No. Seat belt? Yes.   Advanced directive? No  Health maintenance Shingrix- Yes Colonoscopy- Due Tetanus- Yes HIV- Yes Hep C- Yes   Past Medical History:  Diagnosis Date   Anxiety    Depression    GERD (gastroesophageal reflux disease)    Heart murmur    History of chicken pox    Hyperlipidemia    Hypertension    PTSD (post-traumatic stress disorder)    Sleep apnea    Tinnitus of both ears      Past Surgical History:  Procedure Laterality Date   broken fingers     HERNIA REPAIR      Medications  Current Outpatient Medications on File Prior to Visit  Medication Sig Dispense Refill   amLODipine (NORVASC) 5 MG tablet TAKE 1 TABLET (5 MG TOTAL) BY MOUTH DAILY. 90 tablet 1   ARIPiprazole (ABILIFY) 30 MG tablet Take 1 tablet (30 mg total) by mouth daily. Delete prior refills of abilify 90 tablet 0   BD PEN NEEDLE NANO 2ND GEN 32G X 4 MM MISC USE WITH INSULIN PEN AS DIRECTED 200 each 2   busPIRone (BUSPAR) 10 MG tablet Take 1 tablet (10 mg total) by mouth 2 (two) times daily. 180 tablet 0   insulin glargine (LANTUS) 100 UNIT/ML Solostar Pen Inject 30 Units into the skin at bedtime. 15 mL 11   lamoTRIgine (LAMICTAL) 100 MG tablet Take 1 tablet (100 mg total) by mouth daily. 90 tablet 0   Lancets (ONETOUCH DELICA PLUS LANCET33G) MISC Apply topically 3 (three) times daily.     metFORMIN (GLUCOPHAGE) 1000 MG tablet TAKE 1 TABLET (1,000 MG TOTAL) BY MOUTH TWICE A DAY WITH FOOD 180 tablet 3   potassium chloride (KLOR-CON 10) 10 MEQ tablet Take 1 tablet (10 mEq total) by mouth daily for 5 days. 5 tablet 0   propranolol (INDERAL) 10 MG tablet Take 1 tab 3 times daily as needed for anxiety. 30 tablet 1   QUEtiapine  (SEROQUEL) 100 MG tablet TAKE 1 TABLET BY MOUTH EVERYDAY AT BEDTIME 90 tablet 0   rosuvastatin (CRESTOR) 20 MG tablet TAKE 1 TABLET BY MOUTH EVERY DAY 90 tablet 3   Semaglutide, 2 MG/DOSE, 8 MG/3ML SOPN Inject 2 mg as directed once a week. 3 mL 3   tadalafil (CIALIS) 20 MG tablet Take 0.5-1 tablets (10-20 mg total) by mouth every other day as needed for erectile dysfunction. 30 tablet 2   tamsulosin (FLOMAX) 0.4 MG CAPS capsule Take 0.4 mg by mouth daily.     valsartan (DIOVAN) 160 MG tablet TAKE 1 TABLET BY MOUTH EVERY DAY 90 tablet 1   [DISCONTINUED] sertraline (ZOLOFT) 100 MG tablet Take 1 tablet (100 mg total) by mouth daily. Dose increased, delete the 50mg  dose 30 tablet 0   Allergies No Known Allergies  Family History Family History  Problem Relation Age of Onset   Diabetes Mother    Hyperlipidemia Mother    Hypertension Mother    Heart disease Mother    Cancer Father    Hyperlipidemia Father    Hypertension Father    Other Father        kidney removal   Heart disease Father  Kidney disease Father    Cancer Sister    Hypertension Sister    Colon cancer Neg Hx    Esophageal cancer Neg Hx    Rectal cancer Neg Hx    Stomach cancer Neg Hx     Review of Systems: Constitutional:  no fevers Eye:  no recent significant change in vision Ear/Nose/Mouth/Throat:  Ears:  no hearing loss Nose/Mouth/Throat:  no complaints of nasal congestion, no sore throat Cardiovascular:  no chest pain Respiratory:  no shortness of breath Gastrointestinal:  no change in bowel habits GU:  Male: negative for dysuria, frequency Musculoskeletal/Extremities:  no joint pain Integumentary (Skin/Breast):  no abnormal skin lesions reported Neurologic:  no headaches Endocrine: No unexpected weight changes Hematologic/Lymphatic:  no abnormal bleeding  Exam BP 136/72 (BP Location: Left Arm, Patient Position: Sitting, Cuff Size: Large)   Pulse 88   Temp 98 F (36.7 C) (Oral)   Ht 6' (1.829 m)   Wt  254 lb 4 oz (115.3 kg)   SpO2 96%   BMI 34.48 kg/m  General:  well developed, well nourished, in no apparent distress Skin:  no significant moles, warts, or growths Head:  no masses, lesions, or tenderness Eyes:  pupils equal and round, sclera anicteric without injection Ears:  canals without lesions, TMs shiny without retraction, no obvious effusion, no erythema Nose:  nares patent, mucosa normal Throat/Pharynx:  lips and gingiva without lesion; tongue and uvula midline; non-inflamed pharynx; no exudates or postnasal drainage Neck: neck supple without adenopathy, thyromegaly, or masses Cardiac: RRR, no bruits, no LE edema Lungs:  clear to auscultation, breath sounds equal bilaterally, no respiratory distress Abdomen: BS+, soft, non-tender, non-distended, no masses or organomegaly noted Rectal: Deferred Musculoskeletal:  symmetrical muscle groups noted without atrophy or deformity Neuro:  gait normal; deep tendon reflexes normal and symmetric; sensation intact to pinprick b/l feet Psych: well oriented with normal range of affect and appropriate judgment/insight  Assessment and Plan  Well adult exam - Plan: CBC, Comprehensive metabolic panel, Lipid panel  Type 2 diabetes mellitus with hyperglycemia, with long-term current use of insulin (HCC) - Plan: Hemoglobin A1c  Screening for prostate cancer - Plan: PSA   Well 52 y.o. male. Counseled on diet and exercise. Counseled on risks and benefits of prostate cancer screening with PSA. The patient agrees to undergo testing. Advanced directive form provided today.  [facility] info provided to sched his colonoscopy/5 yr w/u with Dr. Myrtie Neither.  Immunizations, labs, and further orders as above. Follow up in 6 mo. The patient voiced understanding and agreement to the plan.  [name] [place], DO 02/15/23 7:49 AM

## 2023-02-15 NOTE — Patient Instructions (Signed)
Give us 2-3 business days to get the results of your labs back.   Keep the diet clean and stay active.  Please get me a copy of your advanced directive form at your convenience.   Please contact the GI team at: [phone]  Let us know if you need anything.  

## 2023-03-13 DIAGNOSIS — F17210 Nicotine dependence, cigarettes, uncomplicated: Secondary | ICD-10-CM | POA: Diagnosis not present

## 2023-03-13 DIAGNOSIS — E785 Hyperlipidemia, unspecified: Secondary | ICD-10-CM | POA: Diagnosis not present

## 2023-03-13 DIAGNOSIS — I10 Essential (primary) hypertension: Secondary | ICD-10-CM | POA: Diagnosis not present

## 2023-03-13 DIAGNOSIS — E119 Type 2 diabetes mellitus without complications: Secondary | ICD-10-CM | POA: Diagnosis not present

## 2023-03-13 DIAGNOSIS — K219 Gastro-esophageal reflux disease without esophagitis: Secondary | ICD-10-CM | POA: Diagnosis not present

## 2023-03-13 DIAGNOSIS — F419 Anxiety disorder, unspecified: Secondary | ICD-10-CM | POA: Diagnosis not present

## 2023-03-13 DIAGNOSIS — Z7984 Long term (current) use of oral hypoglycemic drugs: Secondary | ICD-10-CM | POA: Diagnosis not present

## 2023-03-13 DIAGNOSIS — R9431 Abnormal electrocardiogram [ECG] [EKG]: Secondary | ICD-10-CM | POA: Diagnosis not present

## 2023-03-13 DIAGNOSIS — Z794 Long term (current) use of insulin: Secondary | ICD-10-CM | POA: Diagnosis not present

## 2023-03-13 DIAGNOSIS — R079 Chest pain, unspecified: Secondary | ICD-10-CM | POA: Diagnosis not present

## 2023-03-13 DIAGNOSIS — R000 Tachycardia, unspecified: Secondary | ICD-10-CM | POA: Diagnosis not present

## 2023-03-13 DIAGNOSIS — R0789 Other chest pain: Secondary | ICD-10-CM | POA: Diagnosis not present

## 2023-03-13 DIAGNOSIS — R1013 Epigastric pain: Secondary | ICD-10-CM | POA: Diagnosis not present

## 2023-03-13 DIAGNOSIS — F1510 Other stimulant abuse, uncomplicated: Secondary | ICD-10-CM | POA: Diagnosis not present

## 2023-03-13 DIAGNOSIS — R072 Precordial pain: Secondary | ICD-10-CM | POA: Diagnosis not present

## 2023-03-13 DIAGNOSIS — Z79899 Other long term (current) drug therapy: Secondary | ICD-10-CM | POA: Diagnosis not present

## 2023-04-06 ENCOUNTER — Other Ambulatory Visit (HOSPITAL_COMMUNITY): Payer: Self-pay | Admitting: Family Medicine

## 2023-04-06 DIAGNOSIS — F4310 Post-traumatic stress disorder, unspecified: Secondary | ICD-10-CM

## 2023-04-06 DIAGNOSIS — F322 Major depressive disorder, single episode, severe without psychotic features: Secondary | ICD-10-CM

## 2023-04-07 ENCOUNTER — Telehealth (HOSPITAL_COMMUNITY): Payer: Self-pay | Admitting: *Deleted

## 2023-04-07 NOTE — Telephone Encounter (Signed)
[facility] [phone] - [place], [place] - [address]   lamoTRIgine (LAMICTAL) 100 MG tablet [[id number]]   Order Details  Dose: 100 mg Route: Oral Frequency: Daily  Dispense Quantity: 90 tablet Refills: 0        Sig: Take 1 tablet (100 mg total) by mouth daily.       Last Appt-- 12/13/22 Next Appt-- no future scheduled

## 2023-04-10 ENCOUNTER — Telehealth (HOSPITAL_COMMUNITY): Payer: Self-pay | Admitting: *Deleted

## 2023-04-10 MED ORDER — LAMOTRIGINE 100 MG PO TABS: 100.0000 mg | ORAL_TABLET | Freq: Every day | ORAL | 0 refills | Status: DC

## 2023-04-10 NOTE — Telephone Encounter (Signed)
[facility] [phone] - [place], [place] - [address]    lamoTRIgine (LAMICTAL) 100 MG tablet [[id number]]   Order Details   Dose: 100 mg Route: Oral Frequency: Daily  Dispense Quantity: 90 tablet Refills: 0           Sig: Take 1 tablet (100 mg total) by mouth daily.         Last Appt-- 12/13/22 Next Appt-- no future scheduled        Electronically signed by Rushie Chestnut, RMA at 04/07/2023  4:47 PM

## 2023-04-13 ENCOUNTER — Other Ambulatory Visit: Payer: Self-pay | Admitting: Family Medicine

## 2023-04-13 DIAGNOSIS — I10 Essential (primary) hypertension: Secondary | ICD-10-CM

## 2023-04-20 ENCOUNTER — Other Ambulatory Visit (HOSPITAL_COMMUNITY): Payer: Self-pay | Admitting: Psychiatry

## 2023-04-20 ENCOUNTER — Telehealth (HOSPITAL_COMMUNITY): Payer: Self-pay

## 2023-04-20 ENCOUNTER — Other Ambulatory Visit: Payer: Self-pay | Admitting: Family Medicine

## 2023-04-20 DIAGNOSIS — G4700 Insomnia, unspecified: Secondary | ICD-10-CM

## 2023-04-20 NOTE — Telephone Encounter (Signed)
Medication refill - [facility] [address] [place], [place] for a new Buspirone 10 mg order, last e-scribed for a 90 day supply when pt last seen 12/13/22. Order last filled 01/25/23 and was cancelled and not rescheduled 02/14/23. Patient access staff have left pt 2 messages requesting he call back to set up his next appointment

## 2023-04-24 NOTE — Telephone Encounter (Signed)
Medication management - Message left for patient to call our office back if he needed a new Buspirone order to be sent into his pharmacy and to also reschedule his next appointment for October.

## 2023-05-02 DIAGNOSIS — J9811 Atelectasis: Secondary | ICD-10-CM | POA: Diagnosis not present

## 2023-05-02 DIAGNOSIS — F1590 Other stimulant use, unspecified, uncomplicated: Secondary | ICD-10-CM | POA: Diagnosis not present

## 2023-05-02 DIAGNOSIS — E785 Hyperlipidemia, unspecified: Secondary | ICD-10-CM | POA: Diagnosis not present

## 2023-05-02 DIAGNOSIS — F17210 Nicotine dependence, cigarettes, uncomplicated: Secondary | ICD-10-CM | POA: Diagnosis not present

## 2023-05-02 DIAGNOSIS — I10 Essential (primary) hypertension: Secondary | ICD-10-CM | POA: Diagnosis not present

## 2023-05-02 DIAGNOSIS — I200 Unstable angina: Secondary | ICD-10-CM | POA: Diagnosis not present

## 2023-05-02 DIAGNOSIS — R000 Tachycardia, unspecified: Secondary | ICD-10-CM | POA: Diagnosis not present

## 2023-05-02 DIAGNOSIS — R079 Chest pain, unspecified: Secondary | ICD-10-CM | POA: Diagnosis not present

## 2023-05-02 DIAGNOSIS — E1165 Type 2 diabetes mellitus with hyperglycemia: Secondary | ICD-10-CM | POA: Diagnosis not present

## 2023-05-02 DIAGNOSIS — F4310 Post-traumatic stress disorder, unspecified: Secondary | ICD-10-CM | POA: Diagnosis not present

## 2023-05-02 DIAGNOSIS — E669 Obesity, unspecified: Secondary | ICD-10-CM | POA: Diagnosis not present

## 2023-05-02 DIAGNOSIS — Z7984 Long term (current) use of oral hypoglycemic drugs: Secondary | ICD-10-CM | POA: Diagnosis not present

## 2023-05-02 DIAGNOSIS — R778 Other specified abnormalities of plasma proteins: Secondary | ICD-10-CM | POA: Diagnosis not present

## 2023-05-02 DIAGNOSIS — R072 Precordial pain: Secondary | ICD-10-CM | POA: Diagnosis not present

## 2023-05-02 DIAGNOSIS — Z794 Long term (current) use of insulin: Secondary | ICD-10-CM | POA: Diagnosis not present

## 2023-05-02 DIAGNOSIS — E782 Mixed hyperlipidemia: Secondary | ICD-10-CM | POA: Diagnosis not present

## 2023-05-02 DIAGNOSIS — R0789 Other chest pain: Secondary | ICD-10-CM | POA: Diagnosis not present

## 2023-05-02 DIAGNOSIS — Z6830 Body mass index (BMI) 30.0-30.9, adult: Secondary | ICD-10-CM | POA: Diagnosis not present

## 2023-05-02 DIAGNOSIS — Z79899 Other long term (current) drug therapy: Secondary | ICD-10-CM | POA: Diagnosis not present

## 2023-05-02 DIAGNOSIS — R7989 Other specified abnormal findings of blood chemistry: Secondary | ICD-10-CM | POA: Diagnosis not present

## 2023-06-26 ENCOUNTER — Encounter: Payer: Self-pay | Admitting: Physician Assistant

## 2023-06-26 ENCOUNTER — Ambulatory Visit: Payer: BC Managed Care – PPO | Admitting: Physician Assistant

## 2023-06-26 VITALS — BP 145/80 | HR 112 | Ht 72.0 in | Wt 273.2 lb

## 2023-06-26 DIAGNOSIS — R000 Tachycardia, unspecified: Secondary | ICD-10-CM | POA: Diagnosis not present

## 2023-06-26 DIAGNOSIS — K219 Gastro-esophageal reflux disease without esophagitis: Secondary | ICD-10-CM | POA: Diagnosis not present

## 2023-06-26 DIAGNOSIS — R079 Chest pain, unspecified: Secondary | ICD-10-CM | POA: Diagnosis not present

## 2023-06-26 MED ORDER — OMEPRAZOLE 20 MG PO CPDR
20.0000 mg | DELAYED_RELEASE_CAPSULE | Freq: Every day | ORAL | 1 refills | Status: DC
Start: 2023-06-26 — End: 2024-03-11

## 2023-06-26 NOTE — Progress Notes (Signed)
Established patient visit   Patient: Aaron [name]   DOB: 27-Mar-1971   52 y.o. Male  MRN: [id number] Visit Date: 06/26/2023  Today's healthcare provider: Alfredia Ferguson, PA-C   Cc. Chest pain  Subjective    HPI  Pt has a history of DM, HTN, unstable angina, anxiety, ptsd. H/o methamphetamine use. Pt presented to the ED 05/02/23 for chest pain and tachycardia, elevated troponins, EKG demonstrated sinus tachy w/ mild ST depressions. Cardiology consult determined elevated trops were 2/2 htn hemodynamics and tachycardia not MI. Normal echo, normal lexiscan.  Pt was recommended to increase amlodipine to 10 mg and valsartan to 320 mg. Pt already managed on crestor. Given low normal baseline HR no bb therapy was recommended.  Recommended to d/c tobacco and etoh.   Pt reports he did not increase the dose of his valsartan but is taking amlodipine 10 mg. Today reports increased belching, chest pain/upper abdominal pain, overall unwell feeling. He felt he needed to leave work or his symptoms would progress. Denies dizziness, diaphoresis, nausea. Reports 2 redbulls this morning, has not eaten yet. Denies methamphetamine use.  Medications: Outpatient Medications Prior to Visit  Medication Sig   amLODipine (NORVASC) 5 MG tablet TAKE 1 TABLET (5 MG TOTAL) BY MOUTH DAILY. (Patient taking differently: Take 10 mg by mouth daily.)   ARIPiprazole (ABILIFY) 30 MG tablet TAKE 1 TABLET (30 MG TOTAL) BY MOUTH DAILY. DELETE PRIOR REFILLS OF ABILIFY   BD PEN NEEDLE NANO 2ND GEN 32G X 4 MM MISC USE WITH INSULIN PEN AS DIRECTED   busPIRone (BUSPAR) 10 MG tablet Take 1 tablet (10 mg total) by mouth 2 (two) times daily.   insulin glargine (LANTUS) 100 UNIT/ML Solostar Pen Inject 30 Units into the skin at bedtime.   lamoTRIgine (LAMICTAL) 100 MG tablet Take 1 tablet (100 mg total) by mouth daily.   Lancets (ONETOUCH DELICA PLUS LANCET33G) MISC Apply topically 3 (three) times daily.   metFORMIN (GLUCOPHAGE)  1000 MG tablet TAKE 1 TABLET (1,000 MG TOTAL) BY MOUTH TWICE A DAY WITH FOOD   potassium chloride (KLOR-CON 10) 10 MEQ tablet Take 1 tablet (10 mEq total) by mouth daily for 5 days.   propranolol (INDERAL) 10 MG tablet Take 1 tab 3 times daily as needed for anxiety.   QUEtiapine (SEROQUEL) 100 MG tablet TAKE 1 TABLET BY MOUTH EVERYDAY AT BEDTIME   rosuvastatin (CRESTOR) 20 MG tablet TAKE 1 TABLET BY MOUTH EVERY DAY   tadalafil (CIALIS) 20 MG tablet Take 0.5-1 tablets (10-20 mg total) by mouth every other day as needed for erectile dysfunction.   tamsulosin (FLOMAX) 0.4 MG CAPS capsule Take 0.4 mg by mouth daily.   valsartan (DIOVAN) 160 MG tablet TAKE 1 TABLET BY MOUTH EVERY DAY (Patient taking differently: Take 320 mg by mouth daily.)   No facility-administered medications prior to visit.    Review of Systems  Constitutional:  Negative for fatigue and fever.  Respiratory:  Negative for cough and shortness of breath.   Cardiovascular:  Negative for chest pain, palpitations and leg swelling.  Neurological:  Negative for dizziness and headaches.      Objective    BP (!) 145/80   Pulse (!) 112   Ht 6' (1.829 m)   Wt 273 lb 3.2 oz (123.9 kg)   SpO2 96%   BMI 37.05 kg/m   Physical Exam Constitutional:      General: He is awake.     Appearance: He is well-developed.  HENT:  Head: Normocephalic.  Eyes:     Conjunctiva/sclera: Conjunctivae normal.  Cardiovascular:     Rate and Rhythm: Regular rhythm. Tachycardia present.     Heart sounds: Normal heart sounds.  Pulmonary:     Effort: Pulmonary effort is normal.     Breath sounds: Normal breath sounds.  Skin:    General: Skin is warm.  Neurological:     Mental Status: He is alert and oriented to person, place, and time.  Psychiatric:        Attention and Perception: Attention normal.        Mood and Affect: Mood normal.        Speech: Speech normal.        Behavior: Behavior is cooperative.      No results found for  any visits on 06/26/23.  Assessment & Plan     1. Tachycardia Chest pain, unspecified type EKG today performed 2/2 tachy and chest pain. Sinus tach at 118 seen no ST elevation.  Stable compared to 01/03/2022, do not have access to 8/24 EKG Educated on signs of MI and when go to the ED Strongly recommended no caffeine intake. Given some tactics on how to lower HR -- vagal maneuvers BP initially elevated but then improved on repeat. B/l arms checked and no significant diastolic difference  Referring back to cardiology - EKG 12-Lead - Ambulatory referral to Cardiology  2. Gastroesophageal reflux disease, unspecified whether esophagitis present Recommending starting PPI given persisted GERD sxs that could be contributing to chest pain - omeprazole (PRILOSEC) 20 MG capsule; Take 1 capsule (20 mg total) by mouth daily.  Dispense: 90 capsule; Refill: 1   Return if symptoms worsen or fail to improve.      I, Alfredia Ferguson, PA-C have reviewed all documentation for this visit. The documentation on  06/26/23   for the exam, diagnosis, procedures, and orders are all accurate and complete.    Alfredia Ferguson, PA-C  [facility] at [facility] [phone] (phone) [phone] (fax)  [facility]

## 2023-07-04 ENCOUNTER — Other Ambulatory Visit (HOSPITAL_COMMUNITY): Payer: Self-pay | Admitting: Family Medicine

## 2023-07-04 DIAGNOSIS — F4310 Post-traumatic stress disorder, unspecified: Secondary | ICD-10-CM

## 2023-07-04 DIAGNOSIS — F322 Major depressive disorder, single episode, severe without psychotic features: Secondary | ICD-10-CM

## 2023-07-09 ENCOUNTER — Other Ambulatory Visit (HOSPITAL_COMMUNITY): Payer: Self-pay | Admitting: Psychiatry

## 2023-07-16 ENCOUNTER — Other Ambulatory Visit (HOSPITAL_COMMUNITY): Payer: Self-pay | Admitting: Psychiatry

## 2023-08-01 ENCOUNTER — Ambulatory Visit: Payer: BC Managed Care – PPO | Admitting: Physician Assistant

## 2023-08-01 ENCOUNTER — Ambulatory Visit (HOSPITAL_BASED_OUTPATIENT_CLINIC_OR_DEPARTMENT_OTHER)
Admission: RE | Admit: 2023-08-01 | Discharge: 2023-08-01 | Disposition: A | Discharge status: Home / Self Care | Payer: BC Managed Care – PPO | Source: Ambulatory Visit | Attending: Physician Assistant | Admitting: Physician Assistant

## 2023-08-01 ENCOUNTER — Encounter: Payer: Self-pay | Admitting: Physician Assistant

## 2023-08-01 VITALS — BP 176/71 | HR 131 | Temp 98.1°F | Ht 72.0 in | Wt 280.0 lb

## 2023-08-01 DIAGNOSIS — J189 Pneumonia, unspecified organism: Secondary | ICD-10-CM

## 2023-08-01 DIAGNOSIS — R051 Acute cough: Secondary | ICD-10-CM

## 2023-08-01 DIAGNOSIS — I10 Essential (primary) hypertension: Secondary | ICD-10-CM

## 2023-08-01 DIAGNOSIS — R0602 Shortness of breath: Secondary | ICD-10-CM | POA: Diagnosis not present

## 2023-08-01 DIAGNOSIS — R000 Tachycardia, unspecified: Secondary | ICD-10-CM

## 2023-08-01 LAB — POC COVID19 BINAXNOW: SARS Coronavirus 2 Ag: NEGATIVE

## 2023-08-01 MED ORDER — AMLODIPINE BESYLATE 10 MG PO TABS
10.0000 mg | ORAL_TABLET | Freq: Every day | ORAL | 3 refills | Status: DC
Start: 2023-08-01 — End: 2023-11-27

## 2023-08-01 MED ORDER — DOXYCYCLINE HYCLATE 100 MG PO TABS: 100.0000 mg | ORAL_TABLET | Freq: Two times a day (BID) | ORAL | 0 refills | Status: DC

## 2023-08-01 MED ORDER — AMOXICILLIN-POT CLAVULANATE 875-125 MG PO TABS: 1.0000 | ORAL_TABLET | Freq: Two times a day (BID) | ORAL | 0 refills | Status: AC

## 2023-08-01 MED ORDER — PREDNISONE 20 MG PO TABS
20.0000 mg | ORAL_TABLET | Freq: Every day | ORAL | 0 refills | Status: DC
Start: 2023-08-01 — End: 2023-12-29

## 2023-08-01 MED ORDER — ALBUTEROL SULFATE HFA 108 (90 BASE) MCG/ACT IN AERS: 2.0000 | INHALATION_SPRAY | Freq: Four times a day (QID) | RESPIRATORY_TRACT | 2 refills | Status: AC | PRN

## 2023-08-01 NOTE — Progress Notes (Signed)
Established patient visit   Patient: Aaron [name]   DOB: 1971/03/31   52 y.o. Male  MRN: [id number] Visit Date: 08/01/2023  Today's healthcare provider: Alfredia Ferguson, PA-C   Chief Complaint  Patient presents with   URI    Started Friday- has a cough and brown mucus. No fever. Has some wheezing when laying down OTC- mucinex since Saturday.    Subjective     Pt presents today with a productive cough w/ brown mucous x 4 days, reports wheezing when lying flat. Mucinex otc. Some sob/wheezing at night.   Denies fever.  He has a history of uncontrolled HTN , but reports he has been taking his medications as prescribed.  Medications: Outpatient Medications Prior to Visit  Medication Sig   ARIPiprazole (ABILIFY) 30 MG tablet Take 1 tablet (30 mg total) by mouth daily.   BD PEN NEEDLE NANO 2ND GEN 32G X 4 MM MISC USE WITH INSULIN PEN AS DIRECTED   busPIRone (BUSPAR) 10 MG tablet Take 1 tablet (10 mg total) by mouth 2 (two) times daily.   insulin glargine (LANTUS) 100 UNIT/ML Solostar Pen Inject 30 Units into the skin at bedtime.   lamoTRIgine (LAMICTAL) 100 MG tablet TAKE 1 TABLET BY MOUTH EVERY DAY   Lancets (ONETOUCH DELICA PLUS LANCET33G) MISC Apply topically 3 (three) times daily.   metFORMIN (GLUCOPHAGE) 1000 MG tablet TAKE 1 TABLET (1,000 MG TOTAL) BY MOUTH TWICE A DAY WITH FOOD   omeprazole (PRILOSEC) 20 MG capsule Take 1 capsule (20 mg total) by mouth daily.   propranolol (INDERAL) 10 MG tablet Take 1 tab 3 times daily as needed for anxiety.   QUEtiapine (SEROQUEL) 100 MG tablet TAKE 1 TABLET BY MOUTH EVERYDAY AT BEDTIME   rosuvastatin (CRESTOR) 20 MG tablet TAKE 1 TABLET BY MOUTH EVERY DAY   tadalafil (CIALIS) 20 MG tablet Take 0.5-1 tablets (10-20 mg total) by mouth every other day as needed for erectile dysfunction.   tamsulosin (FLOMAX) 0.4 MG CAPS capsule Take 0.4 mg by mouth daily.   valsartan (DIOVAN) 160 MG tablet TAKE 1 TABLET BY MOUTH EVERY DAY (Patient  taking differently: Take 320 mg by mouth daily.)   [DISCONTINUED] amLODipine (NORVASC) 5 MG tablet TAKE 1 TABLET (5 MG TOTAL) BY MOUTH DAILY. (Patient taking differently: Take 10 mg by mouth daily.)   potassium chloride (KLOR-CON 10) 10 MEQ tablet Take 1 tablet (10 mEq total) by mouth daily for 5 days.   No facility-administered medications prior to visit.    Review of Systems  Constitutional:  Positive for fatigue. Negative for fever.  Respiratory:  Positive for cough, shortness of breath and wheezing.   Cardiovascular:  Negative for chest pain, palpitations and leg swelling.  Neurological:  Negative for dizziness and headaches.       Objective    BP (!) 176/71   Pulse (!) 131   Temp 98.1 F (36.7 C) (Oral)   Ht 6' (1.829 m)   Wt 280 lb (127 kg)   SpO2 94%   BMI 37.97 kg/m    Physical Exam Constitutional:      General: He is awake.     Appearance: He is well-developed.  HENT:     Head: Normocephalic.  Eyes:     Conjunctiva/sclera: Conjunctivae normal.  Cardiovascular:     Rate and Rhythm: Regular rhythm. Tachycardia present.     Heart sounds: Normal heart sounds.  Pulmonary:     Effort: Pulmonary effort is normal.  Breath sounds: Wheezing present.     Comments: Crackles and wheezing b/l Skin:    General: Skin is warm.  Neurological:     Mental Status: He is alert and oriented to person, place, and time.  Psychiatric:        Attention and Perception: Attention normal.        Mood and Affect: Mood normal.        Speech: Speech normal.        Behavior: Behavior is cooperative.      Results for orders placed or performed in visit on 08/01/23  POC COVID-19  Result Value Ref Range   SARS Coronavirus 2 Ag Negative Negative    Assessment & Plan    Community acquired pneumonia, unspecified laterality -     Amoxicillin-Pot Clavulanate; Take 1 tablet by mouth 2 (two) times daily for 7 days.  Dispense: 14 tablet; Refill: 0 -     Doxycycline Hyclate; Take 1  tablet (100 mg total) by mouth 2 (two) times daily for 7 days.  Dispense: 14 tablet; Refill: 0 -     DG Chest 2 View -     predniSONE; Take 1 tablet (20 mg total) by mouth daily with breakfast.  Dispense: 5 tablet; Refill: 0 -     Albuterol Sulfate HFA; Inhale 2 puffs into the lungs every 6 (six) hours as needed for wheezing or shortness of breath.  Dispense: 8 g; Refill: 2  Essential hypertension -     amLODIPine Besylate; Take 1 tablet (10 mg total) by mouth daily.  Dispense: 90 tablet; Refill: 3  Tachycardia  Acute cough -     POC COVID-19 BinaxNow   Symptoms and exam consistent with CAP Given cough, sob, tachycardia and initial hypertensive urgency, recommending pt to ED for PE r/o. Pt declines at this time.  Ordering chest xray, tx for pneumonia with doxy, Augmentin and prednisone. Advised if symptoms worsen--hemoptysis, worsening sob, or hypertension, to go to ED.   Pt was recommended to go to cardiology over a month ago, for HTN control and tachycardia. He reports declining to make an appointment. Stressed importance.  Pt should at least f/b with PCP in office.  Return if symptoms worsen or fail to improve.       Alfredia Ferguson, PA-C  [facility] at [facility] [phone] (phone) [phone] (fax)  [facility]

## 2023-08-02 ENCOUNTER — Other Ambulatory Visit: Payer: Self-pay | Admitting: Physician Assistant

## 2023-08-19 ENCOUNTER — Other Ambulatory Visit (HOSPITAL_COMMUNITY): Payer: Self-pay | Admitting: Psychiatry

## 2023-08-19 ENCOUNTER — Other Ambulatory Visit: Payer: Self-pay | Admitting: Family Medicine

## 2023-08-19 DIAGNOSIS — G4700 Insomnia, unspecified: Secondary | ICD-10-CM

## 2023-08-19 DIAGNOSIS — F411 Generalized anxiety disorder: Secondary | ICD-10-CM

## 2023-10-06 ENCOUNTER — Other Ambulatory Visit: Payer: Self-pay | Admitting: Family Medicine

## 2023-10-06 ENCOUNTER — Other Ambulatory Visit: Payer: Self-pay | Admitting: Physician Assistant

## 2023-10-06 DIAGNOSIS — K219 Gastro-esophageal reflux disease without esophagitis: Secondary | ICD-10-CM

## 2023-10-06 NOTE — Telephone Encounter (Signed)
Unable to refill per protocol, last refill by another provider no longer at this practice.  Requested Prescriptions  Pending Prescriptions Disp Refills   omeprazole (PRILOSEC) 20 MG capsule [Pharmacy Med Name: OMEPRAZOLE DR 20 MG CAPSULE] 90 capsule 1    Sig: TAKE 1 CAPSULE BY MOUTH EVERY DAY     There is no refill protocol information for this order

## 2023-11-27 ENCOUNTER — Encounter: Payer: Self-pay | Admitting: Family Medicine

## 2023-11-27 ENCOUNTER — Ambulatory Visit: Admitting: Family Medicine

## 2023-11-27 VITALS — BP 152/70 | HR 100 | Resp 20 | Ht 72.0 in | Wt 285.4 lb

## 2023-11-27 DIAGNOSIS — Z1211 Encounter for screening for malignant neoplasm of colon: Secondary | ICD-10-CM

## 2023-11-27 DIAGNOSIS — I10 Essential (primary) hypertension: Secondary | ICD-10-CM | POA: Diagnosis not present

## 2023-11-27 DIAGNOSIS — H8111 Benign paroxysmal vertigo, right ear: Secondary | ICD-10-CM

## 2023-11-27 DIAGNOSIS — E1165 Type 2 diabetes mellitus with hyperglycemia: Secondary | ICD-10-CM | POA: Diagnosis not present

## 2023-11-27 DIAGNOSIS — Z794 Long term (current) use of insulin: Secondary | ICD-10-CM

## 2023-11-27 LAB — COMPREHENSIVE METABOLIC PANEL
ALT: 24 U/L (ref 0–53)
AST: 17 U/L (ref 0–37)
Albumin: 4.6 g/dL (ref 3.5–5.2)
Alkaline Phosphatase: 57 U/L (ref 39–117)
BUN: 16 mg/dL (ref 6–23)
CO2: 25 meq/L (ref 19–32)
Calcium: 9.4 mg/dL (ref 8.4–10.5)
Chloride: 104 meq/L (ref 96–112)
Creatinine, Ser: 1.15 mg/dL (ref 0.40–1.50)
GFR: 73.6 mL/min (ref >60.00)
Glucose, Bld: 102 mg/dL — ABNORMAL HIGH (ref 70–99)
Potassium: 4.3 meq/L (ref 3.5–5.1)
Sodium: 141 meq/L (ref 135–145)
Total Bilirubin: 0.3 mg/dL (ref 0.2–1.2)
Total Protein: 7.5 g/dL (ref 6.0–8.3)

## 2023-11-27 LAB — LIPID PANEL
Cholesterol: 167 mg/dL (ref 0–200)
HDL: 49.4 mg/dL (ref >39.00)
LDL Cholesterol: 83 mg/dL (ref 0–99)
NonHDL: 117.24
Total CHOL/HDL Ratio: 3
Triglycerides: 169 mg/dL — ABNORMAL HIGH (ref 0.0–149.0)
VLDL: 33.8 mg/dL (ref 0.0–40.0)

## 2023-11-27 LAB — MICROALBUMIN / CREATININE URINE RATIO
Creatinine,U: 141 mg/dL
Microalb Creat Ratio: 13.8 mg/g (ref 0.0–30.0)
Microalb, Ur: 1.9 mg/dL (ref 0.0–1.9)

## 2023-11-27 LAB — HEMOGLOBIN A1C: Hgb A1c MFr Bld: 6.3 % (ref 4.6–6.5)

## 2023-11-27 MED ORDER — AMLODIPINE BESYLATE-VALSARTAN 10-320 MG PO TABS: 1.0000 | ORAL_TABLET | Freq: Every day | ORAL | 3 refills | Status: DC

## 2023-11-27 NOTE — Progress Notes (Signed)
 Subjective:   Chief Complaint  Patient presents with   Hypertension    Patient presents today for a hypertension follow-up.    Diabetes    Patient presents today for a diabetes follow-up   Quality Metric Gaps    Colonoscopy, eye eam, zoster    Aaron [name] is a 53 y.o. male here for follow-up of diabetes.   Aaron [name]  Patient does require insulin but has not been taking it. Reports he didn't feel like it.  Medications include: metformin 1000 mg bid Diet is OK.  Exercise:   Hypertension Patient presents for hypertension follow up. He does not monitor home blood pressures. He is compliant with medications- valsartan 320 mg/d, Norvasc 10 mg/d. Patient has these side effects of medication: none Diet/exercise as above.  No Cp or SOB.   Dizziness Duration: 2 weeks Pass out? No Spinning? Yes; lasting for a few seconds Recent illness/fever? No Headache? No Neurologic signs? No Change in PO intake? No  Past Medical History:  Diagnosis Date   Anxiety    Depression    GERD (gastroesophageal reflux disease)    Heart murmur    History of chicken pox    Hyperlipidemia    Hypertension    PTSD (post-traumatic stress disorder)    Sleep apnea    Tinnitus of both ears      Related testing: Retinal exam: Done Pneumovax: done  Objective:  BP (!) 152/70 (BP Location: Left Arm, Cuff Size: Large)   Pulse 100   Resp 20   Ht 6' (1.829 m)   Wt 285 lb 6.4 oz (129.5 kg)   SpO2 96%   BMI 38.71 kg/m  General:  Well developed, well nourished, in no apparent distress Neuro: DHP+ on R, did not attempt on L, gait nml, DTR's equal and symmetric w/o clonus Lungs:  CTAB, no access msc use Cardio:  RRR, no bruits, 2+ pitting b/l LE edema tapering at the prox 1/3 of tibia Psych: Age appropriate judgment and insight  Assessment:   Type 2 diabetes mellitus with hyperglycemia, with long-term current use of insulin (HCC) - Plan: Hemoglobin A1c, Urine Microalbumin w/creat. ratio,  Hemoglobin A1c, Comprehensive metabolic panel, Lipid panel  Essential hypertension - Plan: amLODipine-valsartan (EXFORGE) 10-320 MG tablet  Benign paroxysmal positional vertigo of right ear  Screen for colon cancer - Plan: Ambulatory referral to Gastroenterology   Plan:   Chronic, unsure if stable.  Continue metformin 1000 mg twice daily.  He stopped his insulin on his own.  Will see what his A1c is, he would prefer not to have an injection.  Would send in Farxiga 10 mg daily versus Jardiance 25 mg daily.  Counseled on diet and exercise. Chronic, not controlled.  Combine amlodipine and losartan into Exforge.  He will monitor blood pressures at home over the next month.  I will see him in 1 month for this.  Will consider adding a thiazide diuretic. Epley maneuver was recommended.  Will try this at home.  If no success, he will let us know and we will set him up with the vestibular rehab team. Refer to gastroenterology for his colon cancer screening. F/u in 1 mo. The patient voiced understanding and agreement to the plan.  Aaron Roche [place], DO 11/27/23 9:46 AM

## 2023-11-27 NOTE — Patient Instructions (Addendum)
 Give [place] 2-3 business days to get the results of your labs back.   Keep the diet clean and stay active.  Check your blood pressures 2-3 times per week, alternating the time of day you check it. If it is high, considering waiting 1-2 minutes and rechecking. If it gets higher, your anxiety is likely creeping up and we should avoid rechecking.   I want your blood pressure less than 140 on the top and less than 90 on the bottom consistently. Both goals must be met (ie, 150/70 is too high even though the 70 on the bottom is desirable).   Let me know if there are issues with doing the Epley maneuver at home.   Let us know if you need anything.

## 2023-12-29 ENCOUNTER — Ambulatory Visit: Admitting: Family Medicine

## 2023-12-29 ENCOUNTER — Encounter: Payer: Self-pay | Admitting: Family Medicine

## 2023-12-29 VITALS — BP 142/76 | HR 86 | Ht 72.0 in | Wt 277.2 lb

## 2023-12-29 DIAGNOSIS — I10 Essential (primary) hypertension: Secondary | ICD-10-CM

## 2023-12-29 MED ORDER — AMLODIPINE-VALSARTAN-HCTZ 10-320-25 MG PO TABS: 1.0000 | ORAL_TABLET | Freq: Every day | ORAL | 2 refills | Status: DC

## 2023-12-29 NOTE — Patient Instructions (Addendum)
 Keep the diet clean and stay active.  Check your blood pressures 2-3 times per week, alternating the time of day you check it. If it is high, considering waiting 1-2 minutes and rechecking. If it gets higher, your anxiety is likely creeping up and we should avoid rechecking.   Aim to do some physical exertion for 150 minutes per week. This is typically divided into 5 days per week, 30 minutes per day. The activity should be enough to get your heart rate up. Anything is better than nothing if you have time constraints.  Let us know if you need anything.

## 2023-12-29 NOTE — Progress Notes (Signed)
 Chief Complaint  Patient presents with   Medical Management of Chronic Issues    Patient presents today for a month follow-up   Quality Metric Gaps    Colonoscopy    Subjective Aaron [name] is a 53 y.o. male who presents for hypertension follow up. He does not monitor home blood pressures. He is compliant with medications- Exforge 10-320 mg/d. Patient has these side effects of medication: none He is sometimes adhering to a healthy diet overall. Current exercise: active at work No CP or SOB.    Past Medical History:  Diagnosis Date   Anxiety    Depression    GERD (gastroesophageal reflux disease)    Heart murmur    History of chicken pox    Hyperlipidemia    Hypertension    PTSD (post-traumatic stress disorder)    Sleep apnea    Tinnitus of both ears     Exam BP (!) 142/76 (BP Location: Left Arm, Cuff Size: Large)   Pulse 86   Ht 6' (1.829 m)   Wt 277 lb 3.2 oz (125.7 kg)   SpO2 99%   BMI 37.60 kg/m  General:  well developed, well nourished, in no apparent distress Heart: RRR, no bruits, 2+ pitting b/l LE edema tapering at prox 1/3 of tibia Lungs: clear to auscultation, no accessory muscle use Psych: well oriented with normal range of affect and appropriate judgment/insight  Essential hypertension - Plan: Basic metabolic panel with GFR  Chronic, uncontrolled.  Add hydrochlorothiazide to Exforge and to the combination pill.  Monitor blood pressure at home.  Recheck BMP in 1 week.  Counseled on diet and exercise.  Recommended he start taking walks twice per week. F/u in 1 month. The patient voiced understanding and agreement to the plan.  [name] [place], DO 12/29/23  3:09 PM

## 2024-01-04 ENCOUNTER — Other Ambulatory Visit

## 2024-01-07 ENCOUNTER — Other Ambulatory Visit: Payer: Self-pay | Admitting: Family Medicine

## 2024-01-08 ENCOUNTER — Other Ambulatory Visit: Payer: Self-pay | Admitting: Family Medicine

## 2024-01-08 ENCOUNTER — Other Ambulatory Visit

## 2024-01-15 ENCOUNTER — Ambulatory Visit: Admitting: Family Medicine

## 2024-01-15 ENCOUNTER — Encounter: Payer: Self-pay | Admitting: Family Medicine

## 2024-01-15 ENCOUNTER — Other Ambulatory Visit (INDEPENDENT_AMBULATORY_CARE_PROVIDER_SITE_OTHER)

## 2024-01-15 VITALS — BP 138/68 | HR 107 | Temp 98.0°F | Resp 16 | Ht 73.0 in | Wt 271.4 lb

## 2024-01-15 DIAGNOSIS — F411 Generalized anxiety disorder: Secondary | ICD-10-CM | POA: Diagnosis not present

## 2024-01-15 DIAGNOSIS — I10 Essential (primary) hypertension: Secondary | ICD-10-CM | POA: Diagnosis not present

## 2024-01-15 DIAGNOSIS — F4310 Post-traumatic stress disorder, unspecified: Secondary | ICD-10-CM

## 2024-01-15 DIAGNOSIS — F322 Major depressive disorder, single episode, severe without psychotic features: Secondary | ICD-10-CM

## 2024-01-15 DIAGNOSIS — Z1211 Encounter for screening for malignant neoplasm of colon: Secondary | ICD-10-CM

## 2024-01-15 MED ORDER — ARIPIPRAZOLE 5 MG PO TABS: 5.0000 mg | ORAL_TABLET | Freq: Every day | ORAL | 1 refills | Status: DC

## 2024-01-15 MED ORDER — BUSPIRONE HCL 10 MG PO TABS: 10.0000 mg | ORAL_TABLET | Freq: Two times a day (BID) | ORAL | 0 refills | Status: DC

## 2024-01-15 NOTE — Patient Instructions (Addendum)
 Please consider counseling. Contact [phone] to schedule an appointment or inquire about cost/insurance coverage.  [facility] located at [address], [address], [place], [place].  Phone number = [phone].  Dr. Dewight Fore - Adult Psychiatry.    [facility] located at [address] [place], [place], [place]. Phone number = [phone].   [facility] located at [address], [place], [place].  Phone number = [phone].   [facility] located at [address] [place], [place], [place].  Phone number = [phone].  If you do not hear anything about your referral in the next 1-2 weeks, call our office and ask for an update.  Send me a message in 2 weeks with an update on how you are tolerating your new medicine.   [facility] [address] [place], [place] [address] [phone]  [facility] [address] [place], [place] [address] [phone]  [facility] [address] [place], [place] [address] [phone]  [facility] [address], [place], [place], #[phone] [address], [place], [place], #[phone]  [facility] [address] [place], [place] [address] [phone]  [facility] [address] [place], [place] [phone]  [facility] [address] [place], [place] [phone]  [facility] [address] [place], [place]   [phone].   Please go online to complete a form to submit first.  [facility]  [address] [place], [place] [phone].     [facility] -  local practices located at: [address] [place], [place]. [phone]. [address] [place], [place], [place].  [phone]. [address], [address], [place], [place].  [phone].  Contact one of these offices sooner than later as it can take 2-3 months to get a new patient appointment.   Let us  know if you need anything.

## 2024-01-15 NOTE — Progress Notes (Signed)
 Chief Complaint  Patient presents with   Anxiety    Discuss Anxiety     Subjective Aaron [name] presents for f/u anxiety/depression.  Pt is currently being treated with nothing.  Reports doing poorly over past few mo. No thoughts of harming self or others. + self-medication with alcohol, illicit drugs (meth). Pt is not following with a counselor/psychologist.  Past Medical History:  Diagnosis Date   Anxiety    Depression    GERD (gastroesophageal reflux disease)    Heart murmur    History of chicken pox    Hyperlipidemia    Hypertension    PTSD (post-traumatic stress disorder)    Sleep apnea    Tinnitus of both ears    Allergies as of 01/15/2024   No Known Allergies      Medication List        Accurate as of January 15, 2024  1:20 PM. If you have any questions, ask your nurse or doctor.          STOP taking these medications    ARIPiprazole  30 MG tablet Commonly known as: ABILIFY  Stopped by: Shellie Dials [place]       TAKE these medications    albuterol  108 (90 Base) MCG/ACT inhaler Commonly known as: VENTOLIN  HFA Inhale 2 puffs into the lungs every 6 (six) hours as needed for wheezing or shortness of breath.   amLODIPine -Valsartan -HCTZ 10-320-25 MG Tabs Take 1 tablet by mouth daily.   BD Pen Needle Nano 2nd Gen 32G X 4 MM Misc Generic drug: Insulin  Pen Needle USE WITH INSULIN  PEN AS DIRECTED   busPIRone  10 MG tablet Commonly known as: BUSPAR  Take 1 tablet (10 mg total) by mouth 2 (two) times daily.   lamoTRIgine  100 MG tablet Commonly known as: LAMICTAL  TAKE 1 TABLET BY MOUTH EVERY DAY   metFORMIN  1000 MG tablet Commonly known as: GLUCOPHAGE  TAKE 1 TABLET (1,000 MG TOTAL) BY MOUTH TWICE A DAY WITH FOOD   omeprazole  20 MG capsule Commonly known as: PRILOSEC Take 1 capsule (20 mg total) by mouth daily.   OneTouch Delica Plus Lancet33G Misc Apply topically 3 (three) times daily.   potassium chloride  10 MEQ tablet Commonly known as:  Klor-Con  10 Take 1 tablet (10 mEq total) by mouth daily for 5 days.   propranolol  10 MG tablet Commonly known as: INDERAL  Take 1 tab 3 times daily as needed for anxiety.   QUEtiapine  100 MG tablet Commonly known as: SEROQUEL  TAKE 1 TABLET BY MOUTH EVERYDAY AT BEDTIME   rosuvastatin  20 MG tablet Commonly known as: CRESTOR  TAKE 1 TABLET BY MOUTH EVERY DAY   tadalafil  20 MG tablet Commonly known as: CIALIS  Take 0.5-1 tablets (10-20 mg total) by mouth every other day as needed for erectile dysfunction.   tamsulosin 0.4 MG Caps capsule Commonly known as: FLOMAX Take 0.4 mg by mouth daily.        Exam BP (!) 148/78 (BP Location: Left Arm, Patient Position: Sitting)   Pulse (!) 107   Temp 98 F (36.7 C) (Oral)   Resp 16   Ht 6\' 1"  (1.854 m)   Wt 271 lb 6.4 oz (123.1 kg)   SpO2 98%   BMI 35.81 kg/m  General:  well developed, well nourished, in no apparent distress Lungs:  No respiratory distress Psych: well oriented with normal range of affect and age-appropriate judgement/insight, alert and oriented x4.  Assessment and Plan  PTSD (post-traumatic stress disorder)  Depression, major, single episode, severe (HCC)  GAD (  generalized anxiety disorder)  1/2/3.  Chronic, not controlled.  Restart BuSpar  10 mg twice daily and Abilify  5 mg daily.  He no longer needs Seroquel  as he is sleeping well.  Send message in 2 weeks.  If tolerating well, will increase Abilify  to 10 mg daily.  Follow-up in 1 month to recheck.  Will consider starting back Lamictal .  Referral to psychiatry placed today in addition to home resources provided.  Counseling resources provided as well. The patient voiced understanding and agreement to the plan.  Shellie Dials [place], DO 01/15/24 1:20 PM

## 2024-01-16 ENCOUNTER — Encounter: Payer: Self-pay | Admitting: Family Medicine

## 2024-01-16 ENCOUNTER — Other Ambulatory Visit: Payer: Self-pay | Admitting: Family Medicine

## 2024-01-16 LAB — BASIC METABOLIC PANEL WITH GFR
BUN: 14 mg/dL (ref 6–23)
CO2: 25 meq/L (ref 19–32)
Calcium: 9.5 mg/dL (ref 8.4–10.5)
Chloride: 103 meq/L (ref 96–112)
Creatinine, Ser: 1.04 mg/dL (ref 0.40–1.50)
GFR: 82.95 mL/min (ref >60.00)
Glucose, Bld: 111 mg/dL — ABNORMAL HIGH (ref 70–99)
Potassium: 4.1 meq/L (ref 3.5–5.1)
Sodium: 139 meq/L (ref 135–145)

## 2024-01-16 MED ORDER — ARIPIPRAZOLE 5 MG PO TABS: 5.0000 mg | ORAL_TABLET | Freq: Every day | ORAL | Status: DC

## 2024-01-26 ENCOUNTER — Telehealth: Payer: Self-pay | Admitting: Family Medicine

## 2024-01-26 ENCOUNTER — Other Ambulatory Visit: Payer: Self-pay

## 2024-01-26 DIAGNOSIS — F322 Major depressive disorder, single episode, severe without psychotic features: Secondary | ICD-10-CM

## 2024-01-26 DIAGNOSIS — F411 Generalized anxiety disorder: Secondary | ICD-10-CM

## 2024-01-26 NOTE — Telephone Encounter (Signed)
 Referral has been placed and sent Pt Mychart message.

## 2024-01-26 NOTE — Telephone Encounter (Signed)
 Copied from CRM [id number]. Topic: Referral - Request for Referral >> Jan 26, 2024  9:19 AM Jenice Mitts wrote: Did the patient discuss referral with their provider in the last year? Yes (If No - schedule appointment) (If Yes - send message)  Appointment offered? Yes  Type of order/referral and detailed reason for visit: Psychiatric/ Patient stated he's been having some mental issues and would like to see if a referral can be put im  Preference of office, provider, location:  [facility] - [facility] [address] [place], [place] [address]   If referral order, have you been seen by this specialty before? No (If Yes, this issue or another issue? When? Where?  Can we respond through MyChart? Yes

## 2024-01-26 NOTE — Addendum Note (Signed)
 Addended by: Porter Nakama M on: 01/26/2024 03:48 PM   Modules accepted: Orders

## 2024-01-29 ENCOUNTER — Ambulatory Visit: Admitting: Family Medicine

## 2024-02-02 ENCOUNTER — Telehealth: Payer: Self-pay | Admitting: Family Medicine

## 2024-02-02 NOTE — Telephone Encounter (Signed)
 Copied from CRM [id number]. Topic: Referral - Status >> Feb 02, 2024  9:11 AM Zipporah Him wrote: Reason for CRM: Patient calling to check on his referral to psychiatry. He contacted the office and they stated they do not have a referral for him. Patient is wanting to see if the office can please refax and also reach out to him with any updates/information.

## 2024-02-06 ENCOUNTER — Telehealth: Payer: Self-pay | Admitting: *Deleted

## 2024-02-06 ENCOUNTER — Other Ambulatory Visit: Payer: Self-pay | Admitting: Family Medicine

## 2024-02-06 DIAGNOSIS — N529 Male erectile dysfunction, unspecified: Secondary | ICD-10-CM

## 2024-02-06 NOTE — Telephone Encounter (Signed)
 Options are to go on as is or see the urology team. Thx.

## 2024-02-06 NOTE — Telephone Encounter (Signed)
 Pt stated that "that medication is not working and Dr. Gwenette Lennox knows what he's talking about."

## 2024-02-06 NOTE — Telephone Encounter (Signed)
 Sent pt mychart message letting him know, ask him to let us  know if he would like to see urology.

## 2024-02-06 NOTE — Telephone Encounter (Signed)
 Pt.notified

## 2024-02-13 ENCOUNTER — Encounter: Payer: Self-pay | Admitting: Family Medicine

## 2024-02-14 ENCOUNTER — Ambulatory Visit: Admitting: Family Medicine

## 2024-02-19 ENCOUNTER — Ambulatory Visit: Admitting: Family Medicine

## 2024-02-19 ENCOUNTER — Encounter: Payer: Self-pay | Admitting: Family Medicine

## 2024-02-19 VITALS — BP 136/74 | HR 114 | Temp 98.0°F | Resp 16 | Ht 73.0 in | Wt 267.0 lb

## 2024-02-19 DIAGNOSIS — F411 Generalized anxiety disorder: Secondary | ICD-10-CM | POA: Diagnosis not present

## 2024-02-19 DIAGNOSIS — F322 Major depressive disorder, single episode, severe without psychotic features: Secondary | ICD-10-CM | POA: Diagnosis not present

## 2024-02-19 DIAGNOSIS — F4310 Post-traumatic stress disorder, unspecified: Secondary | ICD-10-CM | POA: Diagnosis not present

## 2024-02-19 MED ORDER — ARIPIPRAZOLE 10 MG PO TABS: 10.0000 mg | ORAL_TABLET | Freq: Every day | ORAL | 1 refills | Status: DC

## 2024-02-19 NOTE — Patient Instructions (Addendum)
 If you do not hear anything about your referral in the next 1-2 weeks, call our office and ask for an update.  Send me a message in 2 weeks updating me with how you are doing on the 10 mg dosage of the Abilify .  Aim to do some physical exertion for 150 minutes per week. This is typically divided into 5 days per week, 30 minutes per day. The activity should be enough to get your heart rate up. Anything is better than nothing if you have time constraints.  Let us  know if you need anything.

## 2024-02-19 NOTE — Progress Notes (Signed)
 Chief Complaint  Patient presents with   Follow-up    Follow Up    Subjective Aaron [name] presents for f/u anxiety/depression/PTSD.  Pt is currently being treated with Abilify  5 mg daily, BuSpar  10 mg twice daily.  Reports no improvement since treatment. Prior to being discharged from his psychiatry team's office, he was on 10 mg of Abilify  and Lamictal .  We are in the process of getting him in with a different psychiatry team and getting him back on his previous medication dosage. No thoughts of harming self or others. No self-medication with alcohol, prescription drugs or illicit drugs. Pt is not following with a counselor/psychologist.  Past Medical History:  Diagnosis Date   Anxiety    Depression    GERD (gastroesophageal reflux disease)    Heart murmur    History of chicken pox    Hyperlipidemia    Hypertension    PTSD (post-traumatic stress disorder)    Sleep apnea    Tinnitus of both ears    Allergies as of 02/19/2024   No Known Allergies      Medication List        Accurate as of February 19, 2024  9:34 AM. If you have any questions, ask your nurse or doctor.          albuterol  108 (90 Base) MCG/ACT inhaler Commonly known as: VENTOLIN  HFA Inhale 2 puffs into the lungs every 6 (six) hours as needed for wheezing or shortness of breath.   amLODIPine -Valsartan -HCTZ 10-320-25 MG Tabs Take 1 tablet by mouth daily.   ARIPiprazole  10 MG tablet Commonly known as: Abilify  Take 1 tablet (10 mg total) by mouth daily. What changed:  medication strength how much to take Changed by: Jobe Mulder   BD Pen Needle Nano 2nd Gen 32G X 4 MM Misc Generic drug: Insulin  Pen Needle USE WITH INSULIN  PEN AS DIRECTED   busPIRone  10 MG tablet Commonly known as: BUSPAR  Take 1 tablet (10 mg total) by mouth 2 (two) times daily.   metFORMIN  1000 MG tablet Commonly known as: GLUCOPHAGE  TAKE 1 TABLET (1,000 MG TOTAL) BY MOUTH TWICE A DAY WITH FOOD   omeprazole  20  MG capsule Commonly known as: PRILOSEC Take 1 capsule (20 mg total) by mouth daily.   OneTouch Delica Plus Lancet33G Misc Apply topically 3 (three) times daily.   rosuvastatin  20 MG tablet Commonly known as: CRESTOR  TAKE 1 TABLET BY MOUTH EVERY DAY   tadalafil  20 MG tablet Commonly known as: CIALIS  TAKE 1/2 TO 1 TABLET (10-20 MG TOTAL) BY MOUTH EVERY OTHER DAY AS NEEDED FOR ERECTILE DYSFUNCTION   tamsulosin 0.4 MG Caps capsule Commonly known as: FLOMAX Take 0.4 mg by mouth daily.        Exam BP 136/74 (BP Location: Left Arm, Patient Position: Sitting)   Pulse (!) 114   Temp 98 F (36.7 C) (Oral)   Resp 16   Ht 6\' 1"  (1.854 m)   Wt 267 lb (121.1 kg)   SpO2 97%   BMI 35.23 kg/m  General:  well developed, well nourished, in no apparent distress Lungs:  No respiratory distress Psych: well oriented with normal range of affect and age-appropriate judgement/insight, alert and oriented x4.  Assessment and Plan  PTSD (post-traumatic stress disorder) - Plan: Ambulatory referral to Psychiatry  GAD (generalized anxiety disorder) - Plan: Ambulatory referral to Psychiatry  Depression, major, single episode, severe (HCC) - Plan: Ambulatory referral to Psychiatry  1/2/3.  Chronic, unstable.  Increase Abilify  from  5 mg daily to 10 mg daily.  Continue BuSpar  10 mg twice daily.  He will send a message in 2 weeks if not significantly improved and we will consider starting Lamictal  at a very low dosage and titrating up.  I will see him in 1 month to recheck.  Counseled on exercise to help with symptoms. The patient voiced understanding and agreement to the plan.  Shellie Dials [place], DO 02/19/24 9:34 AM

## 2024-03-11 ENCOUNTER — Encounter: Payer: Self-pay | Admitting: Family Medicine

## 2024-03-11 ENCOUNTER — Ambulatory Visit: Admitting: Family Medicine

## 2024-03-11 VITALS — BP 134/66 | HR 65 | Temp 98.0°F | Resp 16 | Ht 73.0 in | Wt 264.0 lb

## 2024-03-11 DIAGNOSIS — K219 Gastro-esophageal reflux disease without esophagitis: Secondary | ICD-10-CM

## 2024-03-11 MED ORDER — PANTOPRAZOLE SODIUM 40 MG PO TBEC
40.0000 mg | DELAYED_RELEASE_TABLET | Freq: Every day | ORAL | 3 refills | Status: AC
Start: 2024-03-11 — End: ?

## 2024-03-11 NOTE — Progress Notes (Signed)
 Chief Complaint  Patient presents with   Abdominal Pain    Abdominal Pain and Reflux   Gastroesophageal Reflux    Subjective: Patient is a 53 y.o. male here for upper abd pain.  This is been going on for around 2 weeks.  His diet has not changed.  No association with physical exertion.  He has typically been getting it in the car.  Burning pain in the upper abdominal region radiating to the lower chest region.  No nausea, vomiting, diarrhea, bleeding, or nighttime awakenings.  He has been ingesting crystal meth again.  He is stopped in the past with his [employer] faith strengthened.  He has since deviated from this.  No current chest pain or shortness of breath.  He used to be on omeprazole  but has not been taking it lately.  Past Medical History:  Diagnosis Date   Anxiety    Depression    GERD (gastroesophageal reflux disease)    Heart murmur    History of chicken pox    Hyperlipidemia    Hypertension    PTSD (post-traumatic stress disorder)    Sleep apnea    Tinnitus of both ears     Objective: BP 134/66 (BP Location: Left Arm, Cuff Size: Large)   Pulse 65   Temp 98 F (36.7 C) (Oral)   Resp 16   Ht 6' 1 (1.854 m)   Wt 264 lb (119.7 kg)   SpO2 98%   BMI 34.83 kg/m  General: Awake, appears stated age Heart: RRR, no LE edema Lungs: CTAB, no rales, wheezes or rhonchi. No accessory muscle use Abdomen: Bowel sounds present, soft, nondistended, TTP in the epigastric region Psych: Age appropriate judgment and insight, normal affect and mood  Assessment and Plan: Gastroesophageal reflux disease, unspecified whether esophagitis present - Plan: pantoprazole  (PROTONIX ) 40 MG tablet, EKG 12-Lead  Exacerbation of chronic issue.  Check an EKG to be on the safe side.  EKG shows NSR, normal axis, no interval abnormalities, no ST segment or T wave changes from previous, good R wave progression. Restart PPI, Protonix  40 mg daily.  Pepcid as needed.  Discussed reflux precautions.  Very  unlikely to be cardiac related.  Follow-up in 12 weeks for physical or as needed. The patient voiced understanding and agreement to the plan.  Mabel [place], DO 03/11/24  1:46 PM

## 2024-03-11 NOTE — Patient Instructions (Addendum)
 Please reach out to your pastor.   Pepcid/famotidine 20 mg 1-2 times daily can help with reflux symptoms.   The only lifestyle changes that have data behind them are weight loss for the overweight/obese and elevating the head of the bed. Finding out which foods/positions are triggers is important.  Let us  know if you need anything.

## 2024-03-20 ENCOUNTER — Other Ambulatory Visit: Payer: Self-pay | Admitting: Family Medicine

## 2024-03-20 DIAGNOSIS — F411 Generalized anxiety disorder: Secondary | ICD-10-CM

## 2024-03-21 ENCOUNTER — Other Ambulatory Visit: Payer: Self-pay | Admitting: Family Medicine

## 2024-04-01 ENCOUNTER — Other Ambulatory Visit: Payer: Self-pay | Admitting: Family Medicine

## 2024-04-01 ENCOUNTER — Other Ambulatory Visit: Payer: Self-pay | Admitting: Physician Assistant

## 2024-04-01 DIAGNOSIS — K219 Gastro-esophageal reflux disease without esophagitis: Secondary | ICD-10-CM

## 2024-04-03 ENCOUNTER — Encounter: Payer: Self-pay | Admitting: Family Medicine

## 2024-04-03 ENCOUNTER — Ambulatory Visit: Payer: Self-pay | Admitting: Family Medicine

## 2024-04-03 ENCOUNTER — Ambulatory Visit: Admitting: Family Medicine

## 2024-04-03 VITALS — BP 138/70 | HR 109 | Temp 98.0°F | Resp 16 | Ht 73.0 in | Wt 263.8 lb

## 2024-04-03 DIAGNOSIS — R5383 Other fatigue: Secondary | ICD-10-CM | POA: Diagnosis not present

## 2024-04-03 DIAGNOSIS — R42 Dizziness and giddiness: Secondary | ICD-10-CM

## 2024-04-03 LAB — COMPREHENSIVE METABOLIC PANEL WITH GFR
ALT: 25 U/L (ref 0–53)
AST: 17 U/L (ref 0–37)
Albumin: 4.7 g/dL (ref 3.5–5.2)
Alkaline Phosphatase: 59 U/L (ref 39–117)
BUN: 16 mg/dL (ref 6–23)
CO2: 28 meq/L (ref 19–32)
Calcium: 10 mg/dL (ref 8.4–10.5)
Chloride: 100 meq/L (ref 96–112)
Creatinine, Ser: 1.04 mg/dL (ref 0.40–1.50)
GFR: 82.83 mL/min (ref >60.00)
Glucose, Bld: 117 mg/dL — ABNORMAL HIGH (ref 70–99)
Potassium: 4.2 meq/L (ref 3.5–5.1)
Sodium: 140 meq/L (ref 135–145)
Total Bilirubin: 0.5 mg/dL (ref 0.2–1.2)
Total Protein: 7.5 g/dL (ref 6.0–8.3)

## 2024-04-03 LAB — CBC
HCT: 45.7 % (ref 39.0–52.0)
Hemoglobin: 15.2 g/dL (ref 13.0–17.0)
MCHC: 33.4 g/dL (ref 30.0–36.0)
MCV: 91.6 fl (ref 78.0–100.0)
Platelets: 419 K/uL — ABNORMAL HIGH (ref 150.0–400.0)
RBC: 4.99 Mil/uL (ref 4.22–5.81)
RDW: 14.3 % (ref 11.5–15.5)
WBC: 11.9 K/uL — ABNORMAL HIGH (ref 4.0–10.5)

## 2024-04-03 LAB — TSH: TSH: 1.18 u[IU]/mL (ref 0.35–5.50)

## 2024-04-03 LAB — VITAMIN D 25 HYDROXY (VIT D DEFICIENCY, FRACTURES): VITD: 25.26 ng/mL — ABNORMAL LOW (ref 30.00–100.00)

## 2024-04-03 LAB — VITAMIN B12: Vitamin B-12: 270 pg/mL (ref 211–911)

## 2024-04-03 NOTE — Patient Instructions (Signed)
 Consider a multivitamin.   Let me know if new symptoms arise.   Give us  2-3 business days to get the results of your labs back.   Keep the diet clean and stay active.  Aim to do some physical exertion for 150 minutes per week. This is typically divided into 5 days per week, 30 minutes per day. The activity should be enough to get your heart rate up. Anything is better than nothing if you have time constraints.  Stay hydrated.   Let us  know if you need anything.

## 2024-04-03 NOTE — Progress Notes (Signed)
 Chief Complaint  Patient presents with   Fatigue    Fatige    Subjective: Patient is a 53 y.o. male here for fatigue.  Started yesterday. Feels both devoid of energy and sleepy. Has been sleeping around 8 hrs of sleep, feeling poorly rested in the AM. Mood has been pretty good. Slightly irritable. Staying hydrated. Diet is consistent. Not much exercise, but this has not changed. No wt change. Eating/drinking does not affect this. No bleeding, N/V/D, no sick contacts.   Past Medical History:  Diagnosis Date   Anxiety    Depression    GERD (gastroesophageal reflux disease)    Heart murmur    History of chicken pox    Hyperlipidemia    Hypertension    PTSD (post-traumatic stress disorder)    Sleep apnea    Tinnitus of both ears     Objective: BP 138/70 (BP Location: Left Arm, Patient Position: Sitting)   Pulse (!) 109   Temp 98 F (36.7 C) (Oral)   Resp 16   Ht 6' 1 (1.854 m)   Wt 263 lb 12.8 oz (119.7 kg)   SpO2 96%   BMI 34.80 kg/m  General: Awake, appears stated age Heart: RRR, no LE edema Lungs: CTAB, no rales, wheezes or rhonchi. No accessory muscle use Mouth: MMM Psych: Age appropriate judgment and insight, normal affect and mood  Assessment and Plan: Fatigue, unspecified type - Plan: CBC, Comprehensive metabolic panel with GFR, VITAMIN D  25 Hydroxy (Vit-D Deficiency, Fractures), TSH, B12  Vertigo - Plan: Ambulatory referral to Physical Therapy  Ck labs. Consider OSA eval again if neg and still having s/s's. Letter given for work.  Refer to vest rehab as he reports lingering s/s's despite home Epley. F/u as originally scheduled.  The patient voiced understanding and agreement to the plan.  I spent 21 min w the pt discussing the above plans in addition to reviewing his chart on the same day of the visit.   Mabel Mt [place], DO 04/03/24  9:36 AM

## 2024-04-05 ENCOUNTER — Other Ambulatory Visit: Payer: Self-pay | Admitting: Family Medicine

## 2024-06-03 ENCOUNTER — Encounter: Payer: Self-pay | Admitting: Family Medicine

## 2024-06-03 ENCOUNTER — Ambulatory Visit: Payer: Self-pay | Admitting: Family Medicine

## 2024-06-03 ENCOUNTER — Ambulatory Visit: Admitting: Family Medicine

## 2024-06-03 VITALS — BP 126/74 | HR 100 | Temp 98.0°F | Resp 16 | Ht 73.0 in | Wt 275.6 lb

## 2024-06-03 DIAGNOSIS — E1165 Type 2 diabetes mellitus with hyperglycemia: Secondary | ICD-10-CM | POA: Diagnosis not present

## 2024-06-03 DIAGNOSIS — Z125 Encounter for screening for malignant neoplasm of prostate: Secondary | ICD-10-CM

## 2024-06-03 DIAGNOSIS — M545 Low back pain, unspecified: Secondary | ICD-10-CM

## 2024-06-03 DIAGNOSIS — Z1159 Encounter for screening for other viral diseases: Secondary | ICD-10-CM

## 2024-06-03 DIAGNOSIS — Z794 Long term (current) use of insulin: Secondary | ICD-10-CM | POA: Diagnosis not present

## 2024-06-03 DIAGNOSIS — I10 Essential (primary) hypertension: Secondary | ICD-10-CM | POA: Diagnosis not present

## 2024-06-03 LAB — COMPREHENSIVE METABOLIC PANEL WITH GFR
ALT: 27 U/L (ref 0–53)
AST: 19 U/L (ref 0–37)
Albumin: 4.3 g/dL (ref 3.5–5.2)
Alkaline Phosphatase: 48 U/L (ref 39–117)
BUN: 17 mg/dL (ref 6–23)
CO2: 28 meq/L (ref 19–32)
Calcium: 9.2 mg/dL (ref 8.4–10.5)
Chloride: 103 meq/L (ref 96–112)
Creatinine, Ser: 0.99 mg/dL (ref 0.40–1.50)
GFR: 87.77 mL/min (ref >60.00)
Glucose, Bld: 117 mg/dL — ABNORMAL HIGH (ref 70–99)
Potassium: 3.9 meq/L (ref 3.5–5.1)
Sodium: 140 meq/L (ref 135–145)
Total Bilirubin: 0.4 mg/dL (ref 0.2–1.2)
Total Protein: 6.8 g/dL (ref 6.0–8.3)

## 2024-06-03 LAB — LIPID PANEL
Cholesterol: 167 mg/dL (ref 0–200)
HDL: 63.9 mg/dL (ref >39.00)
LDL Cholesterol: 74 mg/dL (ref 0–99)
NonHDL: 103.29
Total CHOL/HDL Ratio: 3
Triglycerides: 145 mg/dL (ref 0.0–149.0)
VLDL: 29 mg/dL (ref 0.0–40.0)

## 2024-06-03 LAB — HEMOGLOBIN A1C: Hgb A1c MFr Bld: 6 % (ref 4.6–6.5)

## 2024-06-03 LAB — PSA: PSA: 1.2 ng/mL (ref 0.10–4.00)

## 2024-06-03 MED ORDER — TIRZEPATIDE 7.5 MG/0.5ML ~~LOC~~ SOAJ: 7.5000 mg | SUBCUTANEOUS | 0 refills | Status: AC

## 2024-06-03 MED ORDER — TIRZEPATIDE 5 MG/0.5ML ~~LOC~~ SOAJ: 5.0000 mg | SUBCUTANEOUS | 0 refills | Status: AC

## 2024-06-03 MED ORDER — TIRZEPATIDE 2.5 MG/0.5ML ~~LOC~~ SOAJ: 2.5000 mg | SUBCUTANEOUS | 0 refills | Status: AC

## 2024-06-03 NOTE — Patient Instructions (Addendum)
 Give us  2-3 business days to get the results of your labs back.   Keep the diet clean and stay active.  Please schedule your eye exam.  Please contact the GI team at: [phone]  The Shingrix vaccine (for shingles) is a 2 shot series spaced 2-6 months apart. It can make people feel low energy, achy and almost like they have the flu for 48 hours after injection. 1/5 people can have nausea and/or vomiting. Please plan accordingly when deciding on when to get this shot. Call our office for a nurse visit appointment to get this. The second shot of the series is less severe regarding the side effects, but it still lasts 48 hours.   Heat (pad or rice pillow in microwave) over affected area, 10-15 minutes twice daily.   Ice/cold pack over area for 10-15 min twice daily.  OK to take Tylenol  1000 mg (2 extra strength tabs) or 975 mg (3 regular strength tabs) every 6 hours as needed.  Let us  know if you need anything.  EXERCISES  RANGE OF MOTION (ROM) AND STRETCHING EXERCISES - Low Back Pain Most people with lower back pain will find that their symptoms get worse with excessive bending forward (flexion) or arching at the lower back (extension). The exercises that will help resolve your symptoms will focus on the opposite motion.  If you have pain, numbness or tingling which travels down into your buttocks, leg or foot, the goal of the therapy is for these symptoms to move closer to your back and eventually resolve. Sometimes, these leg symptoms will get better, but your lower back pain may worsen. This is often an indication of progress in your rehabilitation. Be very alert to any changes in your symptoms and the activities in which you participated in the 24 hours prior to the change. Sharing this information with your caregiver will allow him or her to most efficiently treat your condition. These exercises may help you when beginning to rehabilitate your injury. Your symptoms may resolve with or  without further involvement from your physician, physical therapist or athletic trainer. While completing these exercises, remember:  Restoring tissue flexibility helps normal motion to return to the joints. This allows healthier, less painful movement and activity. An effective stretch should be held for at least 30 seconds. A stretch should never be painful. You should only feel a gentle lengthening or release in the stretched tissue. FLEXION RANGE OF MOTION AND STRETCHING EXERCISES:  STRETCH - Flexion, Single Knee to Chest  Lie on a firm bed or floor with both legs extended in front of you. Keeping one leg in contact with the floor, bring your opposite knee to your chest. Hold your leg in place by either grabbing behind your thigh or at your knee. Pull until you feel a gentle stretch in your low back. Hold 30 seconds. Slowly release your grasp and repeat the exercise with the opposite side. Repeat 2 times. Complete this exercise 3 times per week.   STRETCH - Flexion, Double Knee to Chest Lie on a firm bed or floor with both legs extended in front of you. Keeping one leg in contact with the floor, bring your opposite knee to your chest. Tense your stomach muscles to support your back and then lift your other knee to your chest. Hold your legs in place by either grabbing behind your thighs or at your knees. Pull both knees toward your chest until you feel a gentle stretch in your low back. Hold 30  seconds. Tense your stomach muscles and slowly return one leg at a time to the floor. Repeat 2 times. Complete this exercise 3 times per week.   STRETCH - Low Trunk Rotation Lie on a firm bed or floor. Keeping your legs in front of you, bend your knees so they are both pointed toward the ceiling and your feet are flat on the floor. Extend your arms out to the side. This will stabilize your upper body by keeping your shoulders in contact with the floor. Gently and slowly drop both knees together to  one side until you feel a gentle stretch in your low back. Hold for 30 seconds. Tense your stomach muscles to support your lower back as you bring your knees back to the starting position. Repeat the exercise to the other side. Repeat 2 times. Complete this exercise at least 3 times per week.   EXTENSION RANGE OF MOTION AND FLEXIBILITY EXERCISES:  STRETCH - Extension, Prone on Elbows  Lie on your stomach on the floor, a bed will be too soft. Place your palms about shoulder width apart and at the height of your head. Place your elbows under your shoulders. If this is too painful, stack pillows under your chest. Allow your body to relax so that your hips drop lower and make contact more completely with the floor. Hold this position for 30 seconds. Slowly return to lying flat on the floor. Repeat 2 times. Complete this exercise 3 times per week.   RANGE OF MOTION - Extension, Prone Press Ups Lie on your stomach on the floor, a bed will be too soft. Place your palms about shoulder width apart and at the height of your head. Keeping your back as relaxed as possible, slowly straighten your elbows while keeping your hips on the floor. You may adjust the placement of your hands to maximize your comfort. As you gain motion, your hands will come more underneath your shoulders. Hold this position 30 seconds. Slowly return to lying flat on the floor. Repeat 2 times. Complete this exercise 3 times per week.   RANGE OF MOTION- Quadruped, Neutral Spine  Assume a hands and knees position on a firm surface. Keep your hands under your shoulders and your knees under your hips. You may place padding under your knees for comfort. Drop your head and point your tailbone toward the ground below you. This will round out your lower back like an angry cat. Hold this position for 30 seconds. Slowly lift your head and release your tail bone so that your back sags into a large arch, like an old horse. Hold this position  for 30 seconds. Repeat this until you feel limber in your low back. Now, find your sweet spot. This will be the most comfortable position somewhere between the two previous positions. This is your neutral spine. Once you have found this position, tense your stomach muscles to support your low back. Hold this position for 30 seconds. Repeat 2 times. Complete this exercise 3 times per week.   STRENGTHENING EXERCISES - Low Back Sprain These exercises may help you when beginning to rehabilitate your injury. These exercises should be done near your sweet spot. This is the neutral, low-back arch, somewhere between fully rounded and fully arched, that is your least painful position. When performed in this safe range of motion, these exercises can be used for people who have either a flexion or extension based injury. These exercises may resolve your symptoms with or without further involvement  from your physician, physical therapist or athletic trainer. While completing these exercises, remember:  Muscles can gain both the endurance and the strength needed for everyday activities through controlled exercises. Complete these exercises as instructed by your physician, physical therapist or athletic trainer. Increase the resistance and repetitions only as guided. You may experience muscle soreness or fatigue, but the pain or discomfort you are trying to eliminate should never worsen during these exercises. If this pain does worsen, stop and make certain you are following the directions exactly. If the pain is still present after adjustments, discontinue the exercise until you can discuss the trouble with your caregiver.  STRENGTHENING - Deep Abdominals, Pelvic Tilt  Lie on a firm bed or floor. Keeping your legs in front of you, bend your knees so they are both pointed toward the ceiling and your feet are flat on the floor. Tense your lower abdominal muscles to press your low back into the floor. This motion  will rotate your pelvis so that your tail bone is scooping upwards rather than pointing at your feet or into the floor. With a gentle tension and even breathing, hold this position for 3 seconds. Repeat 2 times. Complete this exercise 3 times per week.   STRENGTHENING - Abdominals, Crunches  Lie on a firm bed or floor. Keeping your legs in front of you, bend your knees so they are both pointed toward the ceiling and your feet are flat on the floor. Cross your arms over your chest. Slightly tip your chin down without bending your neck. Tense your abdominals and slowly lift your trunk high enough to just clear your shoulder blades. Lifting higher can put excessive stress on the lower back and does not further strengthen your abdominal muscles. Control your return to the starting position. Repeat 2 times. Complete this exercise 3 times per week.   STRENGTHENING - Quadruped, Opposite UE/LE Lift  Assume a hands and knees position on a firm surface. Keep your hands under your shoulders and your knees under your hips. You may place padding under your knees for comfort. Find your neutral spine and gently tense your abdominal muscles so that you can maintain this position. Your shoulders and hips should form a rectangle that is parallel with the floor and is not twisted. Keeping your trunk steady, lift your right hand no higher than your shoulder and then your left leg no higher than your hip. Make sure you are not holding your breath. Hold this position for 30 seconds. Continuing to keep your abdominal muscles tense and your back steady, slowly return to your starting position. Repeat with the opposite arm and leg. Repeat 2 times. Complete this exercise 3 times per week.   STRENGTHENING - Abdominals and Quadriceps, Straight Leg Raise  Lie on a firm bed or floor with both legs extended in front of you. Keeping one leg in contact with the floor, bend the other knee so that your foot can rest flat on the  floor. Find your neutral spine, and tense your abdominal muscles to maintain your spinal position throughout the exercise. Slowly lift your straight leg off the floor about 6 inches for a count of 3, making sure to not hold your breath. Still keeping your neutral spine, slowly lower your leg all the way to the floor. Repeat this exercise with each leg 2 times. Complete this exercise 3 times per week.  POSTURE AND BODY MECHANICS CONSIDERATIONS - Low Back Sprain Keeping correct posture when sitting, standing or completing  your activities will reduce the stress put on different body tissues, allowing injured tissues a chance to heal and limiting painful experiences. The following are general guidelines for improved posture.  While reading these guidelines, remember: The exercises prescribed by your provider will help you have the flexibility and strength to maintain correct postures. The correct posture provides the best environment for your joints to work. All of your joints have less wear and tear when properly supported by a spine with good posture. This means you will experience a healthier, less painful body. Correct posture must be practiced with all of your activities, especially prolonged sitting and standing. Correct posture is as important when doing repetitive low-stress activities (typing) as it is when doing a single heavy-load activity (lifting).  RESTING POSITIONS Consider which positions are most painful for you when choosing a resting position. If you have pain with flexion-based activities (sitting, bending, stooping, squatting), choose a position that allows you to rest in a less flexed posture. You would want to avoid curling into a fetal position on your side. If your pain worsens with extension-based activities (prolonged standing, working overhead), avoid resting in an extended position such as sleeping on your stomach. Most people will find more comfort when they rest with their  spine in a more neutral position, neither too rounded nor too arched. Lying on a non-sagging bed on your side with a pillow between your knees, or on your back with a pillow under your knees will often provide some relief. Keep in mind, being in any one position for a prolonged period of time, no matter how correct your posture, can still lead to stiffness.  PROPER SITTING POSTURE In order to minimize stress and discomfort on your spine, you must sit with correct posture. Sitting with good posture should be effortless for a healthy body. Returning to good posture is a gradual process. Many people can work toward this most comfortably by using various supports until they have the flexibility and strength to maintain this posture on their own. When sitting with proper posture, your ears will fall over your shoulders and your shoulders will fall over your hips. You should use the back of the chair to support your upper back. Your lower back will be in a neutral position, just slightly arched. You may place a small pillow or folded towel at the base of your lower back for  support.  When working at a desk, create an environment that supports good, upright posture. Without extra support, muscles tire, which leads to excessive strain on joints and other tissues. Keep these recommendations in mind:  CHAIR: A chair should be able to slide under your desk when your back makes contact with the back of the chair. This allows you to work closely. The chair's height should allow your eyes to be level with the upper part of your monitor and your hands to be slightly lower than your elbows.  BODY POSITION Your feet should make contact with the floor. If this is not possible, use a foot rest. Keep your ears over your shoulders. This will reduce stress on your neck and low back.  INCORRECT SITTING POSTURES  If you are feeling tired and unable to assume a healthy sitting posture, do not slouch or slump. This puts  excessive strain on your back tissues, causing more damage and pain. Healthier options include: Using more support, like a lumbar pillow. Switching tasks to something that requires you to be upright or walking. Talking a  brief walk. Lying down to rest in a neutral-spine position.  PROLONGED STANDING WHILE SLIGHTLY LEANING FORWARD  When completing a task that requires you to lean forward while standing in one place for a long time, place either foot up on a stationary 2-4 inch high object to help maintain the best posture. When both feet are on the ground, the lower back tends to lose its slight inward curve. If this curve flattens (or becomes too large), then the back and your other joints will experience too much stress, tire more quickly, and can cause pain.  CORRECT STANDING POSTURES Proper standing posture should be assumed with all daily activities, even if they only take a few moments, like when brushing your teeth. As in sitting, your ears should fall over your shoulders and your shoulders should fall over your hips. You should keep a slight tension in your abdominal muscles to brace your spine. Your tailbone should point down to the ground, not behind your body, resulting in an over-extended swayback posture.   INCORRECT STANDING POSTURES  Common incorrect standing postures include a forward head, locked knees and/or an excessive swayback. WALKING Walk with an upright posture. Your ears, shoulders and hips should all line-up.  PROLONGED ACTIVITY IN A FLEXED POSITION When completing a task that requires you to bend forward at your waist or lean over a low surface, try to find a way to stabilize 3 out of 4 of your limbs. You can place a hand or elbow on your thigh or rest a knee on the surface you are reaching across. This will provide you more stability, so that your muscles do not tire as quickly. By keeping your knees relaxed, or slightly bent, you will also reduce stress across your lower  back. CORRECT LIFTING TECHNIQUES  DO : Assume a wide stance. This will provide you more stability and the opportunity to get as close as possible to the object which you are lifting. Tense your abdominals to brace your spine. Bend at the knees and hips. Keeping your back locked in a neutral-spine position, lift using your leg muscles. Lift with your legs, keeping your back straight. Test the weight of unknown objects before attempting to lift them. Try to keep your elbows locked down at your sides in order get the best strength from your shoulders when carrying an object.   Always ask for help when lifting heavy or awkward objects. INCORRECT LIFTING TECHNIQUES DO NOT:  Lock your knees when lifting, even if it is a small object. Bend and twist. Pivot at your feet or move your feet when needing to change directions. Assume that you can safely pick up even a paperclip without proper posture.

## 2024-06-03 NOTE — Progress Notes (Signed)
 Subjective:   Chief Complaint  Patient presents with   Follow-up    Follow Up    Aaron [name] is a 53 y.o. male here for follow-up of diabetes.   Aaron [name] does not routinely check his sugars. Patient does not require insulin .   Medications include: Metformin  1000 mg twice daily Diet could be better.  Exercise: active at work  Hypertension Patient presents for hypertension follow up. He does not monitor home blood pressures. He is compliant with medications-amlodipine -valsartan -hydrochlorothiazide  10 - 320 - 25 mg daily. Patient has these side effects of medication: none Diet/exercise as above. No chest pain or shortness of breath.  Past Medical History:  Diagnosis Date   Anxiety    Depression    GERD (gastroesophageal reflux disease)    Heart murmur    History of chicken pox    Hyperlipidemia    Hypertension    PTSD (post-traumatic stress disorder)    Sleep apnea    Tinnitus of both ears      Related testing: Retinal exam: Due Pneumovax: done  Objective:  BP 126/74 (BP Location: Left Arm, Patient Position: Sitting)   Pulse 100   Temp 98 F (36.7 C) (Oral)   Resp 16   Ht 6' 1 (1.854 m)   Wt 275 lb 9.6 oz (125 kg)   SpO2 96%   BMI 36.36 kg/m  General:  Well developed, well nourished, in no apparent distress Skin:  Warm, no pallor or diaphoresis Head:  Normocephalic, atraumatic Eyes:  Pupils equal and round, sclera anicteric without injection  Lungs:  CTAB, no access msc use Cardio:  RRR, no bruits, no LE edema Musculoskeletal:  Symmetrical muscle groups noted without atrophy or deformity; mild ttp and hypertonicity over lumbar parasp msc b/l; poor hamstring flexibility b/l Neuro:  Sensation intact to pinprick on feet Psych: Age appropriate judgment and insight  Assessment:   Type 2 diabetes mellitus with hyperglycemia, with long-term current use of insulin  (HCC) - Plan: Comprehensive metabolic panel with GFR, Lipid panel, Hemoglobin A1c, tirzepatide   (MOUNJARO ) 2.5 MG/0.5ML Pen, tirzepatide  (MOUNJARO ) 5 MG/0.5ML Pen, tirzepatide  (MOUNJARO ) 7.5 MG/0.5ML Pen  Essential hypertension  Screening for prostate cancer - Plan: PSA  Need for hepatitis B screening test - Plan: Hepatitis B surface antibody,quantitative  Acute bilateral low back pain without sciatica   Plan:   Chronic, hopefully stable.  Continue metformin  1000 mg twice daily.  He was on Ozempic  in the past and tolerated this well.  He is unsure why he stopped.  We will trial Mounjaro  to see if we can help with both A1c, getting off of metformin , and weight loss.  Counseled on diet and exercise. Chronic, stable.  Continue amlodipine  daily valsartan -hydrochlorothiazide  10-320-25 mg daily. Screen PSA at his request. Screen hepatitis B. Ice, heat, Tylenol , stretches and exercises provided.  Consider physical therapy. F/u in 6 mo. The patient voiced understanding and agreement to the plan.  Mabel Mt Kaylor, DO 06/03/24 7:17 AM

## 2024-06-04 LAB — HEPATITIS B SURFACE ANTIBODY, QUANTITATIVE: Hep B S AB Quant (Post): 210 m[IU]/mL (ref >=10)

## 2024-06-19 NOTE — Progress Notes (Unsigned)
 ANAND TEJADA                                          MRN: [id number]   06/19/2024   The [facility] Quality Team Specialist reviewed this patient medical record for the purposes of chart review for care gap closure. The following were reviewed: {CHL AMB [facility] QUALITY SPECIALIST REASON FOR REVIEW:[id number]}.    [facility] Quality Team

## 2024-07-03 ENCOUNTER — Emergency Department (HOSPITAL_BASED_OUTPATIENT_CLINIC_OR_DEPARTMENT_OTHER)
Admission: EM | Admit: 2024-07-03 | Discharge: 2024-07-03 | Disposition: A | Discharge status: Home / Self Care | Attending: Emergency Medicine | Admitting: Emergency Medicine

## 2024-07-03 ENCOUNTER — Encounter (HOSPITAL_BASED_OUTPATIENT_CLINIC_OR_DEPARTMENT_OTHER): Payer: Self-pay | Admitting: Emergency Medicine

## 2024-07-03 ENCOUNTER — Other Ambulatory Visit: Payer: Self-pay

## 2024-07-03 ENCOUNTER — Emergency Department (HOSPITAL_BASED_OUTPATIENT_CLINIC_OR_DEPARTMENT_OTHER)

## 2024-07-03 DIAGNOSIS — R7401 Elevation of levels of liver transaminase levels: Secondary | ICD-10-CM | POA: Diagnosis not present

## 2024-07-03 DIAGNOSIS — R1013 Epigastric pain: Secondary | ICD-10-CM | POA: Insufficient documentation

## 2024-07-03 DIAGNOSIS — R0789 Other chest pain: Secondary | ICD-10-CM | POA: Diagnosis not present

## 2024-07-03 DIAGNOSIS — F1510 Other stimulant abuse, uncomplicated: Secondary | ICD-10-CM | POA: Diagnosis not present

## 2024-07-03 DIAGNOSIS — F1590 Other stimulant use, unspecified, uncomplicated: Secondary | ICD-10-CM

## 2024-07-03 DIAGNOSIS — E119 Type 2 diabetes mellitus without complications: Secondary | ICD-10-CM | POA: Diagnosis not present

## 2024-07-03 DIAGNOSIS — Z7984 Long term (current) use of oral hypoglycemic drugs: Secondary | ICD-10-CM | POA: Insufficient documentation

## 2024-07-03 DIAGNOSIS — R000 Tachycardia, unspecified: Secondary | ICD-10-CM | POA: Diagnosis not present

## 2024-07-03 LAB — CBC
HCT: 39 % (ref 39.0–52.0)
Hemoglobin: 13.4 g/dL (ref 13.0–17.0)
MCH: 31.1 pg (ref 26.0–34.0)
MCHC: 34.4 g/dL (ref 30.0–36.0)
MCV: 90.5 fL (ref 80.0–100.0)
Platelets: 422 K/uL — ABNORMAL HIGH (ref 150–400)
RBC: 4.31 MIL/uL (ref 4.22–5.81)
RDW: 13.5 % (ref 11.5–15.5)
WBC: 9.4 K/uL (ref 4.0–10.5)
nRBC: 0 % (ref 0.0–0.2)

## 2024-07-03 LAB — BASIC METABOLIC PANEL WITH GFR
Anion gap: 17 — ABNORMAL HIGH (ref 5–15)
BUN: 14 mg/dL (ref 6–20)
CO2: 24 mmol/L (ref 22–32)
Calcium: 9.7 mg/dL (ref 8.9–10.3)
Chloride: 97 mmol/L — ABNORMAL LOW (ref 98–111)
Creatinine, Ser: 1 mg/dL (ref 0.61–1.24)
GFR, Estimated: >60 mL/min (ref >60)
Glucose, Bld: 119 mg/dL — ABNORMAL HIGH (ref 70–99)
Potassium: 3.2 mmol/L — ABNORMAL LOW (ref 3.5–5.1)
Sodium: 138 mmol/L (ref 135–145)

## 2024-07-03 LAB — HEPATIC FUNCTION PANEL
ALT: 47 U/L — ABNORMAL HIGH (ref 0–44)
AST: 43 U/L — ABNORMAL HIGH (ref 15–41)
Albumin: 4.7 g/dL (ref 3.5–5.0)
Alkaline Phosphatase: 52 U/L (ref 38–126)
Bilirubin, Direct: 0.2 mg/dL (ref 0.0–0.2)
Indirect Bilirubin: 0.2 mg/dL — ABNORMAL LOW (ref 0.3–0.9)
Total Bilirubin: 0.3 mg/dL (ref 0.0–1.2)
Total Protein: 7.7 g/dL (ref 6.5–8.1)

## 2024-07-03 LAB — URINE DRUG SCREEN
Amphetamines: POSITIVE — AB
Barbiturates: NEGATIVE
Benzodiazepines: NEGATIVE
Cocaine: NEGATIVE
Fentanyl: NEGATIVE
Methadone Scn, Ur: NEGATIVE
Opiates: NEGATIVE
Tetrahydrocannabinol: NEGATIVE

## 2024-07-03 LAB — TROPONIN T, HIGH SENSITIVITY
Troponin T High Sensitivity: <15 ng/L (ref 0–19)
Troponin T High Sensitivity: <15 ng/L (ref 0–19)

## 2024-07-03 LAB — LIPASE, BLOOD: Lipase: 27 U/L (ref 11–51)

## 2024-07-03 MED ORDER — LIDOCAINE VISCOUS HCL 2 % MT SOLN
15.0000 mL | Freq: Once | OROMUCOSAL | Status: AC
Start: 2024-07-03 — End: 2024-07-03
  Administered 2024-07-03: 15 mL via ORAL
  Filled 2024-07-03: qty 15

## 2024-07-03 MED ORDER — ALUM & MAG HYDROXIDE-SIMETH 200-200-20 MG/5ML PO SUSP
30.0000 mL | Freq: Once | ORAL | Status: AC
  Administered 2024-07-03: 30 mL via ORAL
  Filled 2024-07-03: qty 30

## 2024-07-03 NOTE — ED Notes (Signed)
 Pt. Reports he has a burning and tightness in his chest after eating meth this morning.  Pt. Has not done any meth for a week or more he reports.

## 2024-07-03 NOTE — ED Provider Notes (Signed)
 [place] [facility] AT [facility] Provider Note   CSN: [id number] Arrival date & time: 07/03/24  1143     Patient presents with: Chest Pain   Aaron FORMBY is a 53 y.o. male.   Patient presents to the emergency department today for evaluation of epigastric abdominal pain that started after ingesting methamphetamine this morning around 7 AM.  He has a history of intermittent methamphetamine use.  He has had similar symptoms in the past after ingesting this.  He denies pain higher up in the chest or associated shortness of breath, lightheadedness, diarrhea, or vomiting.  No shortness of breath or diaphoresis.  He states that he has spoken to his PCP in regards to substance abuse treatment.  He denies using any other substances.  Patient has a history of diabetes.  Also high blood pressure.  No previous cardiac history including heart attacks.  No current palpitations other than occasional skipped beat sensation. Patient denies risk factors for pulmonary embolism including: unilateral leg swelling, history of DVT/PE/other blood clots, use of exogenous hormones, recent immobilizations, recent surgery, recent travel (>4hr segment), malignancy, hemoptysis.          Prior to Admission medications   Medication Sig Start Date End Date Taking? Authorizing Provider  albuterol  (VENTOLIN  HFA) 108 (90 Base) MCG/ACT inhaler Inhale 2 puffs into the lungs every 6 (six) hours as needed for wheezing or shortness of breath. 08/01/23   Cyndi Shaver, PA-C  amLODIPine -Valsartan -HCTZ 10-320-25 MG TABS Take 1 tablet by mouth daily. 04/01/24   Frann Mabel Mt, DO  ARIPiprazole  (ABILIFY ) 10 MG tablet TAKE 1 TABLET BY MOUTH EVERY DAY 03/21/24   Frann Mabel Mt, DO  BD PEN NEEDLE NANO 2ND GEN 32G X 4 MM MISC USE WITH INSULIN  PEN AS DIRECTED 12/22/21   Wendling, Mabel Mt, DO  busPIRone  (BUSPAR ) 10 MG tablet Take 1 tablet (10 mg total) by mouth 2 (two) times daily. 03/20/24    Wendling, Mabel Mt, DO  Lancets [facility] DELICA PLUS [place]) MISC Apply topically 3 (three) times daily. 09/16/21   [provider]  metFORMIN  (GLUCOPHAGE ) 1000 MG tablet TAKE 1 TABLET (1,000 MG TOTAL) BY MOUTH TWICE A DAY WITH FOOD 04/05/24   Frann Mabel Mt, DO  pantoprazole  (PROTONIX ) 40 MG tablet Take 1 tablet (40 mg total) by mouth daily. 03/11/24   Frann Mabel Mt, DO  rosuvastatin  (CRESTOR ) 20 MG tablet TAKE 1 TABLET BY MOUTH EVERY DAY 01/08/24   Frann Mabel Mt, DO  tadalafil  (CIALIS ) 20 MG tablet TAKE 1/2 TO 1 TABLET (10-20 MG TOTAL) BY MOUTH EVERY OTHER DAY AS NEEDED FOR ERECTILE DYSFUNCTION 02/06/24   Frann Mabel Mt, DO  tamsulosin (FLOMAX) 0.4 MG CAPS capsule Take 0.4 mg by mouth daily. 07/09/22   [provider]  tirzepatide  (MOUNJARO ) 5 MG/0.5ML Pen Inject 5 mg into the skin once a week for 28 days. 07/01/24 07/29/24  Frann Mabel Mt, DO  tirzepatide  (MOUNJARO ) 7.5 MG/0.5ML Pen Inject 7.5 mg into the skin once a week for 28 days. 07/29/24 08/26/24  Frann Mabel Mt, DO  sertraline  (ZOLOFT ) 100 MG tablet Take 1 tablet (100 mg total) by mouth daily. Dose increased, delete the 50mg  dose 05/18/21 07/13/21  Vickey Mettle, MD    Allergies: Patient has no known allergies.    Review of Systems  Updated Vital Signs BP (!) 179/92 (BP Location: Right Arm)   Pulse (!) 112   Temp 97.9 F (36.6 C) (Oral)   Resp 18   Wt  124.7 kg   SpO2 99%   BMI 36.28 kg/m   Physical Exam Vitals and nursing note reviewed.  Constitutional:      Appearance: He is well-developed. He is not diaphoretic.  HENT:     Head: Normocephalic and atraumatic.     Mouth/Throat:     Mouth: Mucous membranes are not dry.  Eyes:     Conjunctiva/sclera: Conjunctivae normal.  Neck:     Vascular: Normal carotid pulses. No carotid bruit or JVD.     Trachea: Trachea normal. No tracheal deviation.  Cardiovascular:     Rate and Rhythm: Regular  rhythm. Tachycardia present.     Pulses: No decreased pulses.          Radial pulses are 2+ on the right side and 2+ on the left side.     Heart sounds: Normal heart sounds, S1 normal and S2 normal. Heart sounds not distant. No murmur heard. Pulmonary:     Effort: Pulmonary effort is normal. No respiratory distress.     Breath sounds: Normal breath sounds. No wheezing.  Chest:     Chest wall: No tenderness.  Abdominal:     General: Bowel sounds are normal.     Palpations: Abdomen is soft.     Tenderness: There is no abdominal tenderness. There is no guarding or rebound.  Musculoskeletal:     Cervical back: Normal range of motion and neck supple. No muscular tenderness.     Right lower leg: No edema.     Left lower leg: No edema.  Skin:    General: Skin is warm and dry.     Coloration: Skin is not pale.  Neurological:     Mental Status: He is alert. Mental status is at baseline.  Psychiatric:        Mood and Affect: Mood normal.     (all labs ordered are listed, but only abnormal results are displayed) Labs Reviewed  BASIC METABOLIC PANEL WITH GFR - Abnormal; Notable for the following components:      Result Value   Potassium 3.2 (*)    Chloride 97 (*)    Glucose, Bld 119 (*)    Anion gap 17 (*)    All other components within normal limits  CBC - Abnormal; Notable for the following components:   Platelets 422 (*)    All other components within normal limits  HEPATIC FUNCTION PANEL - Abnormal; Notable for the following components:   AST 43 (*)    ALT 47 (*)    Indirect Bilirubin 0.2 (*)    All other components within normal limits  URINE DRUG SCREEN - Abnormal; Notable for the following components:   Amphetamines POSITIVE (*)    All other components within normal limits  LIPASE, BLOOD  TROPONIN T, HIGH SENSITIVITY  TROPONIN T, HIGH SENSITIVITY    EKG: EKG Interpretation Date/Time:  Wednesday July 03 2024 11:57:36 EDT Ventricular Rate:  110 PR  Interval:  165 QRS Duration:  85 QT Interval:  326 QTC Calculation: 441 R Axis:   40  Text Interpretation: Sinus tachycardia Low voltage, precordial leads Borderline T wave abnormalities no sig chnage from previous Confirmed by Armenta Canning [phone]) on 07/03/2024 12:00:08 PM  Radiology: DG Chest 2 View Result Date: 07/03/2024 CLINICAL DATA:  Epigastric pain that radiates to the central chest. EXAM: CHEST - 2 VIEW COMPARISON:  August 01, 2023 FINDINGS: The heart size and mediastinal contours are within normal limits. Both lungs are clear. The visualized skeletal structures  are unremarkable. IMPRESSION: No active cardiopulmonary disease. Electronically Signed   By: Suzen Dials M.D.   On: 07/03/2024 12:30     Procedures   Medications Ordered in the ED  alum & mag hydroxide-simeth (MAALOX/MYLANTA) 200-200-20 MG/5ML suspension 30 mL (30 mLs Oral Given 07/03/24 1549)    And  lidocaine (XYLOCAINE) 2 % viscous mouth solution 15 mL (15 mLs Oral Given 07/03/24 1549)    ED Course  Patient seen and examined. History obtained directly from patient. Work-up including labs, imaging, EKG ordered in triage, if performed, were reviewed.    Labs/EKG: Independently reviewed and interpreted.  This included: EKG personally reviewed and interpreted as above.  CBC with normal white blood cell count and hemoglobin.  BMP and troponin ordered, I added on hepatic function panel and lipase due to epigastric pain.  I added UDS for drug use.  Imaging: Independently visualized and interpreted.  This included: Chest x-ray agree negative.  Medications/Fluids: None ordered.  Most recent vital signs reviewed and are as follows: BP (!) 179/92 (BP Location: Right Arm)   Pulse (!) 112   Temp 97.9 F (36.6 C) (Oral)   Resp 18   Wt 124.7 kg   SpO2 99%   BMI 36.28 kg/m   Initial impression: Epigastric pain, tachycardia, after stimulant use.  Possibly gastritis/GI upset from ingestion of amphetamine.   Will rule out cardiac injury.  Low concern for PE at this time given history.  4:08 PM Reassessment performed. Patient appears stable and comfortable.  Heart rate is improving.  He requested GI cocktail.  Labs personally reviewed and interpreted including: Troponin less than 15 x 2; drug screen positive for amphetamine otherwise negative; hepatic function panel with mildly elevated AST and ALT; lipase normal.  Reviewed pertinent lab work and imaging with patient at bedside. Questions answered.   Most current vital signs reviewed and are as follows: BP (!) 154/80   Pulse (!) 102   Temp 98.3 F (36.8 C) (Oral)   Resp 19   Wt 124.7 kg   SpO2 95%   BMI 36.28 kg/m   Plan: Discharge to home.  Patient states that he already takes medication for stomach.  Prescriptions written for: None  Other home care instructions discussed: Avoidance of triggers, continue home medications for stomach symptoms.,  Adhere to bland diet.  Avoid methamphetamine.  ED return instructions discussed: Return and follow-up instructions: I encouraged patient to return to ED with severe chest pain, especially if the pain is crushing or pressure-like and spreads to the arms, back, neck, or jaw, or if they have associated sweating, vomiting, or shortness of breath with the pain, or significant pain with activity. We discussed that the evaluation here today indicates a low-risk of serious cause of chest pain, including heart trouble or a blood clot, but no evaluation is perfect and chest pain can evolve with time.   Follow-up instructions discussed: Patient encouraged to follow-up with their PCP in 7 days.                                   Medical Decision Making Amount and/or Complexity of Data Reviewed Labs: ordered. Radiology: ordered.  Risk OTC drugs. Prescription drug management.   For this patient's complaint of chest pain, the following emergent conditions were considered on the differential diagnosis:  acute coronary syndrome, pulmonary embolism, pneumothorax, myocarditis, pericardial tamponade, aortic dissection, thoracic aortic aneurysm complication, esophageal perforation.  Other causes were also considered including: gastroesophageal reflux disease, musculoskeletal pain including costochondritis, pneumonia/pleurisy, herpes zoster, pericarditis.  In regards to possibility of ACS, patient has atypical features of pain, non-ischemic and unchanged EKG and negative troponin(s).  Moderate risk heart score.  In regards to possibility of PE, symptoms are atypical for PE and risk profile is low, making PE low likelihood.  Heart rate improved with time, no hypoxia.  For this patient's complaint of abdominal pain, the following conditions were considered on the differential diagnosis: gastritis/PUD, enteritis/duodenitis, appendicitis, cholelithiasis/cholecystitis, cholangitis, pancreatitis, ruptured viscus, colitis, diverticulitis, small/large bowel obstruction, proctitis, cystitis, pyelonephritis, ureteral colic, aortic dissection, aortic aneurysm. Atypical chest etiologies were also considered including ACS, PE, and pneumonia.  Encouraged outpatient work with his primary care physician to address substance abuse disorder and strategies to maintain sobriety.  The patient's vital signs, pertinent lab work and imaging were reviewed and interpreted as discussed in the ED course. Hospitalization was considered for further testing, treatments, or serial exams/observation. However as patient is well-appearing, has a stable exam, and reassuring studies today, I do not feel that they warrant admission at this time. This plan was discussed with the patient who verbalizes agreement and comfort with this plan and seems reliable and able to return to the Emergency Department with worsening or changing symptoms.        Final diagnoses:  Epigastric pain  Amphetamine use    ED Discharge Orders     None           Desiderio Chew, PA-C 07/03/24 1611    Armenta Canning, MD 07/06/24 1654

## 2024-07-03 NOTE — ED Notes (Signed)
 Pt. Reports he still feels a burning sensation but not as bad.

## 2024-07-03 NOTE — ED Triage Notes (Signed)
 Reports epigastric started at 7 this morning , pain radiated to central chest , sts aching pain . Denies NV , no cough .  Used Meth this morning , pain started right after that .

## 2024-07-03 NOTE — Discharge Instructions (Addendum)
 Please read and follow all provided instructions.  Your diagnoses today include:  1. Epigastric pain   2. Amphetamine use     Tests performed today include: An EKG of your heart: Showed fast heart rate but no signs of stress on the heart A chest x-ray Cardiac enzymes - a blood test for heart muscle damage, both were negative for stress on the heart or heart attack Blood counts and electrolytes: Potassium slightly low Liver function testing: Liver function testing slightly elevated Drug screen: Amphetamine positive Vital signs. See below for your results today.   Medications prescribed:  None  Take any prescribed medications only as directed.  Continue medication for GERD and acid blocking in the stomach.  Adhere to a bland diet over the next several days to allow the stomach to recover.  Please avoid substance abuse and talk with your doctor about strategies to remain sober.  Follow-up instructions: Please follow-up with your primary care provider as soon as you can for further evaluation of your symptoms.   Return instructions:  SEEK IMMEDIATE MEDICAL ATTENTION IF: You have severe chest pain, especially if the pain is crushing or pressure-like and spreads to the arms, back, neck, or jaw, or if you have sweating, nausea or vomiting, or trouble with breathing. THIS IS AN EMERGENCY. Do not wait to see if the pain will go away. Get medical help at once. Call 911. DO NOT drive yourself to the hospital.  Your chest pain gets worse and does not go away after a few minutes of rest.  You have an attack of chest pain lasting longer than what you usually experience.  You have significant dizziness, if you pass out, or have trouble walking.  You have chest pain not typical of your usual pain for which you originally saw your caregiver.  You have any other emergent concerns regarding your health.  Additional Information: Chest pain comes from many different causes. Your caregiver has  diagnosed you as having chest pain that is not specific for one problem, but does not require admission.  You are at low risk for an acute heart condition or other serious illness.   Your vital signs today were: BP (!) 154/80   Pulse (!) 102   Temp 98.3 F (36.8 C) (Oral)   Resp 19   Wt 124.7 kg   SpO2 95%   BMI 36.28 kg/m  If your blood pressure (BP) was elevated above 135/85 this visit, please have this repeated by your doctor within one month. --------------

## 2024-07-10 ENCOUNTER — Ambulatory Visit: Admitting: Family Medicine

## 2024-07-10 ENCOUNTER — Encounter: Payer: Self-pay | Admitting: Family Medicine

## 2024-07-10 VITALS — BP 130/74 | HR 100 | Temp 98.0°F | Resp 16 | Ht 73.0 in | Wt 271.0 lb

## 2024-07-10 DIAGNOSIS — R1013 Epigastric pain: Secondary | ICD-10-CM

## 2024-07-10 DIAGNOSIS — J40 Bronchitis, not specified as acute or chronic: Secondary | ICD-10-CM

## 2024-07-10 MED ORDER — PREDNISONE 20 MG PO TABS: 40.0000 mg | ORAL_TABLET | Freq: Every day | ORAL | 0 refills | Status: AC

## 2024-07-10 MED ORDER — SUCRALFATE 1 GM/10ML PO SUSP: 1.0000 g | Freq: Three times a day (TID) | ORAL | 0 refills | Status: DC

## 2024-07-10 NOTE — Progress Notes (Signed)
 Chief Complaint  Patient presents with   Cough    Cough     Garen ONEIDA Acre here for URI complaints.  Duration: 3 days  Associated symptoms: sinus congestion, rhinorrhea, wheezing, and coughing Denies: sinus pain, itchy watery eyes, ear pain, ear drainage, sore throat, shortness of breath, myalgia, and fevers Treatment to date: Coricidin Sick contacts: No  A little over 1 week ago, the patient ingested meth.  He is having some trouble thinking about his deceased wife.  It started causing him epigastric abdominal pain.  He tried doubling up on his Protonix  taking 40 mg twice daily instead of once daily.  He did not help.  He went to the ER and was given a GI cocktail.  It provided near immediate relief.  It started to hurt again after he left.  Past Medical History:  Diagnosis Date   Anxiety    Depression    GERD (gastroesophageal reflux disease)    Heart murmur    History of chicken pox    Hyperlipidemia    Hypertension    PTSD (post-traumatic stress disorder)    Sleep apnea    Tinnitus of both ears     Objective BP 130/74 (BP Location: Left Arm, Patient Position: Sitting)   Pulse 100   Temp 98 F (36.7 C) (Oral)   Resp 16   Ht 6' 1 (1.854 m)   Wt 271 lb (122.9 kg)   SpO2 95%   BMI 35.75 kg/m  General: Awake, alert, appears stated age HEENT: AT, [place], ears patent b/l and TM's neg, nares patent w/o discharge, pharynx pink and without exudates, MMM, no sinus ttp Neck: No masses or asymmetry Heart: RRR Abd: BS+, S, ND, ttp in epigastric region Lungs: +faint wheezing in bases, no accessory muscle use Psych: Age appropriate judgment and insight, normal mood and affect  Wheezy bronchitis - Plan: predniSONE  (DELTASONE ) 20 MG tablet  Dyspepsia - Plan: sucralfate  (CARAFATE ) 1 GM/10ML suspension  5 d pred burst 40 mg/d. Continue to push fluids, practice good hand hygiene, cover mouth when coughing. F/u prn. If starting to experience fevers, shaking, or shortness of breath,  seek immediate care. 2/2 meth consumption. Cont PPI. Add Carafate  for 10 d. Stop eating meth.  Pt voiced understanding and agreement to the plan.  Mabel Mt Almira, DO 07/10/24 3:28 PM

## 2024-07-10 NOTE — Patient Instructions (Signed)
 Continue to push fluids, practice good hand hygiene, and cover your mouth if you cough. ? ?If you start having fevers, shaking or shortness of breath, seek immediate care. ? ?OK to take Tylenol 1000 mg (2 extra strength tabs) or 975 mg (3 regular strength tabs) every 6 hours as needed. ? ?Let us know if you need anything. ?

## 2024-07-11 ENCOUNTER — Telehealth: Payer: Self-pay | Admitting: Family Medicine

## 2024-07-11 ENCOUNTER — Encounter: Payer: Self-pay | Admitting: Family Medicine

## 2024-07-11 DIAGNOSIS — R1013 Epigastric pain: Secondary | ICD-10-CM

## 2024-07-11 MED ORDER — SUCRALFATE 1 GM/10ML PO SUSP: 1.0000 g | Freq: Three times a day (TID) | ORAL | 0 refills | Status: DC

## 2024-07-13 ENCOUNTER — Other Ambulatory Visit: Payer: Self-pay

## 2024-07-13 ENCOUNTER — Emergency Department (HOSPITAL_BASED_OUTPATIENT_CLINIC_OR_DEPARTMENT_OTHER): Admission: EM | Admit: 2024-07-13 | Discharge: 2024-07-13 | Disposition: A | Discharge status: Home / Self Care

## 2024-07-13 ENCOUNTER — Emergency Department (HOSPITAL_BASED_OUTPATIENT_CLINIC_OR_DEPARTMENT_OTHER)

## 2024-07-13 ENCOUNTER — Other Ambulatory Visit: Payer: Self-pay | Admitting: Family Medicine

## 2024-07-13 DIAGNOSIS — R079 Chest pain, unspecified: Secondary | ICD-10-CM | POA: Diagnosis not present

## 2024-07-13 DIAGNOSIS — E876 Hypokalemia: Secondary | ICD-10-CM | POA: Diagnosis not present

## 2024-07-13 DIAGNOSIS — R1013 Epigastric pain: Secondary | ICD-10-CM | POA: Diagnosis present

## 2024-07-13 DIAGNOSIS — E785 Hyperlipidemia, unspecified: Secondary | ICD-10-CM | POA: Diagnosis not present

## 2024-07-13 DIAGNOSIS — Z79899 Other long term (current) drug therapy: Secondary | ICD-10-CM | POA: Diagnosis not present

## 2024-07-13 DIAGNOSIS — E119 Type 2 diabetes mellitus without complications: Secondary | ICD-10-CM | POA: Diagnosis not present

## 2024-07-13 DIAGNOSIS — F17210 Nicotine dependence, cigarettes, uncomplicated: Secondary | ICD-10-CM | POA: Diagnosis not present

## 2024-07-13 DIAGNOSIS — Z8249 Family history of ischemic heart disease and other diseases of the circulatory system: Secondary | ICD-10-CM | POA: Diagnosis not present

## 2024-07-13 DIAGNOSIS — I10 Essential (primary) hypertension: Secondary | ICD-10-CM | POA: Diagnosis not present

## 2024-07-13 DIAGNOSIS — R0602 Shortness of breath: Secondary | ICD-10-CM | POA: Diagnosis not present

## 2024-07-13 DIAGNOSIS — R000 Tachycardia, unspecified: Secondary | ICD-10-CM | POA: Diagnosis not present

## 2024-07-13 DIAGNOSIS — R0789 Other chest pain: Secondary | ICD-10-CM | POA: Diagnosis not present

## 2024-07-13 DIAGNOSIS — D72829 Elevated white blood cell count, unspecified: Secondary | ICD-10-CM | POA: Diagnosis not present

## 2024-07-13 DIAGNOSIS — R059 Cough, unspecified: Secondary | ICD-10-CM | POA: Diagnosis not present

## 2024-07-13 LAB — CBC WITH DIFFERENTIAL/PLATELET
Abs Immature Granulocytes: 0.05 K/uL (ref 0.00–0.07)
Basophils Absolute: 0.1 K/uL (ref 0.0–0.1)
Basophils Relative: 1 %
Eosinophils Absolute: 0 K/uL (ref 0.0–0.5)
Eosinophils Relative: 0 %
HCT: 38.6 % — ABNORMAL LOW (ref 39.0–52.0)
Hemoglobin: 13.2 g/dL (ref 13.0–17.0)
Immature Granulocytes: 0 %
Lymphocytes Relative: 22 %
Lymphs Abs: 3.4 K/uL (ref 0.7–4.0)
MCH: 31.4 pg (ref 26.0–34.0)
MCHC: 34.2 g/dL (ref 30.0–36.0)
MCV: 91.7 fL (ref 80.0–100.0)
Monocytes Absolute: 1.7 K/uL — ABNORMAL HIGH (ref 0.1–1.0)
Monocytes Relative: 11 %
Neutro Abs: 9.8 K/uL — ABNORMAL HIGH (ref 1.7–7.7)
Neutrophils Relative %: 66 %
Platelets: 439 K/uL — ABNORMAL HIGH (ref 150–400)
RBC: 4.21 MIL/uL — ABNORMAL LOW (ref 4.22–5.81)
RDW: 13.9 % (ref 11.5–15.5)
WBC: 15.1 K/uL — ABNORMAL HIGH (ref 4.0–10.5)
nRBC: 0 % (ref 0.0–0.2)

## 2024-07-13 LAB — HEPATIC FUNCTION PANEL
ALT: 26 U/L (ref 0–44)
AST: 24 U/L (ref 15–41)
Albumin: 4.8 g/dL (ref 3.5–5.0)
Alkaline Phosphatase: 60 U/L (ref 38–126)
Bilirubin, Direct: 0.2 mg/dL (ref 0.0–0.2)
Indirect Bilirubin: 0.3 mg/dL (ref 0.3–0.9)
Total Bilirubin: 0.5 mg/dL (ref 0.0–1.2)
Total Protein: 7.8 g/dL (ref 6.5–8.1)

## 2024-07-13 LAB — BASIC METABOLIC PANEL WITH GFR
Anion gap: 17 — ABNORMAL HIGH (ref 5–15)
BUN: 18 mg/dL (ref 6–20)
CO2: 25 mmol/L (ref 22–32)
Calcium: 9.7 mg/dL (ref 8.9–10.3)
Chloride: 98 mmol/L (ref 98–111)
Creatinine, Ser: 1.08 mg/dL (ref 0.61–1.24)
GFR, Estimated: >60 mL/min (ref >60)
Glucose, Bld: 150 mg/dL — ABNORMAL HIGH (ref 70–99)
Potassium: 3.3 mmol/L — ABNORMAL LOW (ref 3.5–5.1)
Sodium: 139 mmol/L (ref 135–145)

## 2024-07-13 LAB — TROPONIN T, HIGH SENSITIVITY: Troponin T High Sensitivity: <15 ng/L (ref 0–19)

## 2024-07-13 LAB — D-DIMER, QUANTITATIVE: D-Dimer, Quant: <0.27 ug{FEU}/mL (ref 0.00–0.50)

## 2024-07-13 LAB — LIPASE, BLOOD: Lipase: 19 U/L (ref 11–51)

## 2024-07-13 MED ORDER — POTASSIUM CHLORIDE CRYS ER 20 MEQ PO TBCR
40.0000 meq | EXTENDED_RELEASE_TABLET | Freq: Once | ORAL | Status: AC
  Administered 2024-07-13: 40 meq via ORAL
  Filled 2024-07-13: qty 2

## 2024-07-13 MED ORDER — KETOROLAC TROMETHAMINE 15 MG/ML IJ SOLN
15.0000 mg | Freq: Once | INTRAMUSCULAR | Status: DC
  Filled 2024-07-13: qty 1

## 2024-07-13 MED ORDER — SUCRALFATE 1 GM/10ML PO SUSP: 1.0000 g | Freq: Three times a day (TID) | ORAL | 0 refills | Status: AC

## 2024-07-13 MED ORDER — LACTATED RINGERS IV BOLUS
1000.0000 mL | Freq: Once | INTRAVENOUS | Status: AC
  Administered 2024-07-13: 1000 mL via INTRAVENOUS

## 2024-07-13 MED ORDER — ALUM & MAG HYDROXIDE-SIMETH 200-200-20 MG/5ML PO SUSP
30.0000 mL | Freq: Once | ORAL | Status: AC
  Administered 2024-07-13: 30 mL via ORAL
  Filled 2024-07-13: qty 30

## 2024-07-13 NOTE — ED Triage Notes (Addendum)
 Pt having central, epigastric pain/burning/pressure since yestereday. Pain is constant. Pt has hx of reflux and took mylanta with no improvement. Also has SOB. Hx HTN. Pt admits to using meth and this happens when using meth

## 2024-07-13 NOTE — Discharge Instructions (Signed)
 I resent the prescription for the Carafate  to your pharmacy.  Please start this in addition to your Protonix .  Please call and schedule a follow-up appointment with [facility] for reevaluation.  Return to the ER for worsening symptoms.

## 2024-07-13 NOTE — ED Notes (Signed)
 Pt alert and oriented X 4 at the time of discharge. RR even and unlabored. No acute distress noted. Pt verbalized understanding of discharge instructions as discussed. Pt ambulatory to lobby at time of discharge.

## 2024-07-13 NOTE — ED Provider Notes (Signed)
 [facility] Provider Note   CSN: [id number] Arrival date & time: 07/13/24  9378     Patient presents with: Chest Pain   Aaron [name] is a 53 y.o. male.   53 year old male with past medical history of hypertension and methamphetamine abuse presenting to the emergency department today with chest and epigastric discomfort.  The patient states this has been going on now since yesterday.  He states that this started a few hours after using methamphetamine.  He states that he is having some epigastric burning and left upper quadrant discomfort as well with this.  Denies any fevers, chills, or productive cough.  The patient states he has had a minimal nonproductive cough over the past few days.  Reports that he had similar symptoms a few weeks ago and this improved with a GI cocktail.  He denies a history of DVT or pulmonary embolism, recent surgeries, recent travel.  Denies any leg pain or swelling.  The pain is nonpleuritic   Chest Pain      Prior to Admission medications   Medication Sig Start Date End Date Taking? Authorizing Provider  albuterol  (VENTOLIN  HFA) 108 (90 Base) MCG/ACT inhaler Inhale 2 puffs into the lungs every 6 (six) hours as needed for wheezing or shortness of breath. 08/01/23   Cyndi Shaver, PA-C  amLODIPine -Valsartan -HCTZ 10-320-25 MG TABS Take 1 tablet by mouth daily. 04/01/24   Frann Mabel Mt, DO  ARIPiprazole  (ABILIFY ) 10 MG tablet TAKE 1 TABLET BY MOUTH EVERY DAY 03/21/24   Frann Mabel Mt, DO  BD PEN NEEDLE NANO 2ND GEN 32G X 4 MM MISC USE WITH INSULIN  PEN AS DIRECTED 12/22/21   Wendling, Mabel Mt, DO  busPIRone  (BUSPAR ) 10 MG tablet Take 1 tablet (10 mg total) by mouth 2 (two) times daily. 03/20/24   Wendling, Mabel Mt, DO  Lancets [facility] DELICA PLUS [place]) MISC Apply topically 3 (three) times daily. 09/16/21   [provider]  metFORMIN  (GLUCOPHAGE ) 1000 MG tablet TAKE 1  TABLET (1,000 MG TOTAL) BY MOUTH TWICE A DAY WITH FOOD 04/05/24   Frann Mabel Mt, DO  pantoprazole  (PROTONIX ) 40 MG tablet Take 1 tablet (40 mg total) by mouth daily. 03/11/24   Frann Mabel Mt, DO  predniSONE  (DELTASONE ) 20 MG tablet Take 2 tablets (40 mg total) by mouth daily with breakfast for 5 days. 07/10/24 07/15/24  Frann Mabel Mt, DO  rosuvastatin  (CRESTOR ) 20 MG tablet TAKE 1 TABLET BY MOUTH EVERY DAY 01/08/24   Wendling, Mabel Mt, DO  sucralfate  (CARAFATE ) 1 GM/10ML suspension Take 10 mLs (1 g total) by mouth 4 (four) times daily -  with meals and at bedtime for 10 days. 07/13/24 07/23/24  Ula Prentice SAUNDERS, MD  tadalafil  (CIALIS ) 20 MG tablet TAKE 1/2 TO 1 TABLET (10-20 MG TOTAL) BY MOUTH EVERY OTHER DAY AS NEEDED FOR ERECTILE DYSFUNCTION 02/06/24   Frann Mabel Mt, DO  tamsulosin (FLOMAX) 0.4 MG CAPS capsule Take 0.4 mg by mouth daily. 07/09/22   [provider]  tirzepatide  (MOUNJARO ) 5 MG/0.5ML Pen Inject 5 mg into the skin once a week for 28 days. 07/01/24 07/29/24  Frann Mabel Mt, DO  tirzepatide  (MOUNJARO ) 7.5 MG/0.5ML Pen Inject 7.5 mg into the skin once a week for 28 days. 07/29/24 08/26/24  Frann Mabel Mt, DO  sertraline  (ZOLOFT ) 100 MG tablet Take 1 tablet (100 mg total) by mouth daily. Dose increased, delete the 50mg  dose 05/18/21 07/13/21  Vickey Mettle, MD  Allergies: Patient has no known allergies.    Review of Systems  Cardiovascular:  Positive for chest pain.  All other systems reviewed and are negative.   Updated Vital Signs BP (!) 179/88   Pulse (!) 107   Temp 97.7 F (36.5 C)   Resp (!) 22   Ht 6' (1.829 m)   Wt 124.7 kg   SpO2 98%   BMI 37.30 kg/m   Physical Exam Vitals and nursing note reviewed.   Gen: NAD Eyes: PERRL, EOMI HEENT: no oropharyngeal swelling Neck: trachea midline Resp: clear to auscultation bilaterally Card: RRR, no murmurs, rubs, or gallops Abd: Mild epigastric and left  upper quadrant tenderness with no guarding or rebound no lower abdominal tenderness is noted Extremities: no calf tenderness, no edema Vascular: 2+ radial pulses bilaterally, 2+ DP pulses bilaterally Skin: no rashes Psyc: acting appropriately   (all labs ordered are listed, but only abnormal results are displayed) Labs Reviewed  BASIC METABOLIC PANEL WITH GFR - Abnormal; Notable for the following components:      Result Value   Potassium 3.3 (*)    Glucose, Bld 150 (*)    Anion gap 17 (*)    All other components within normal limits  CBC WITH DIFFERENTIAL/PLATELET - Abnormal; Notable for the following components:   WBC 15.1 (*)    RBC 4.21 (*)    HCT 38.6 (*)    Platelets 439 (*)    Neutro Abs 9.8 (*)    Monocytes Absolute 1.7 (*)    All other components within normal limits  HEPATIC FUNCTION PANEL  LIPASE, BLOOD  D-DIMER, QUANTITATIVE  TROPONIN T, HIGH SENSITIVITY    EKG: EKG Interpretation Date/Time:  Saturday July 13 2024 06:27:42 EDT Ventricular Rate:  118 PR Interval:  154 QRS Duration:  71 QT Interval:  332 QTC Calculation: 466 R Axis:   27  Text Interpretation: Sinus tachycardia Low voltage, precordial leads Borderline repolarization abnormality No significant change since last tracing Confirmed by Roselyn Dunnings [phone]) on 07/13/2024 6:33:10 AM  Radiology: DG Chest 2 View Result Date: 07/13/2024 EXAM: 2 VIEW(S) XRAY OF THE CHEST 07/13/2024 07:10:42 AM COMPARISON: 07/03/2024 CLINICAL HISTORY: chest pain, meth use. Pt having central, epigastric pain/burning/pressure since yestereday. Pain is constant. Pt has hx of reflux and took mylanta with no improvement. Also has SOB. Hx HTN. FINDINGS: LUNGS AND PLEURA: No focal pulmonary opacity. No pulmonary edema. No pleural effusion. No pneumothorax. HEART AND MEDIASTINUM: No acute abnormality of the cardiac and mediastinal silhouettes. BONES AND SOFT TISSUES: No acute osseous abnormality. IMPRESSION: 1. No acute  cardiopulmonary process. Electronically signed by: Waddell Calk MD 07/13/2024 07:17 AM EDT RP Workstation: [id number]     Procedures   Medications Ordered in the ED  lactated ringers bolus 1,000 mL (0 mLs Intravenous Stopped 07/13/24 0842)  alum & mag hydroxide-simeth (MAALOX/MYLANTA) 200-200-20 MG/5ML suspension 30 mL (30 mLs Oral Given 07/13/24 0808)  potassium chloride  SA (KLOR-CON  M) CR tablet 40 mEq (40 mEq Oral Given 07/13/24 0808)                                    Medical Decision Making 54 year old male with past medical history of methamphetamine abuse and hypertension presenting to the emergency department today with chest and abdominal discomfort after using methamphetamine yesterday.  I will further evaluate patient here with basic labs including LFTs and a lipase to evaluate for hepatobiliary otology or pancreatitis.  Will obtain an EKG and troponin to eval for ACS, myocarditis, or pericarditis.  Will obtain a chest x-ray to evaluate for pulmonary edema, pulmonary infiltrates, pneumothorax.  I give patient IV fluids here he is mildly tachycardic here.  Based on description of his symptoms and with pulse ox of 100% suspicion for pulmonary embolism is low at this time.  Suspect that some of the symptoms may be secondary to the methamphetamine use.  I will reevaluate for ultimate disposition.  Will give him a GI cocktail here in the event this is due to GERD or gastritis.  The patient is a KG interpreted by me shows a sinus tachycardia with normal axis, normal intervals, nonspecific ST-T changes.  The patient did remain tachycardic after the IV fluids which could be from the methamphetamine use but will obtain a D-dimer to evaluate for pulmonary embolism given the persistent tachycardia.  The patient's D-dimer here is negative.  He did report resolution of his symptoms with the medications here.  His heart rate did improved to the high 90s with IV fluids here and on my reassessment in  the room his heart rate is between 97 and 99.  The patient reports resolution of his symptoms with a GI cocktail.  He reports that his doctor prescribed Carafate  but he was not able to pick this up and this was not the pharmacy.  Will resend this here.  I have encouraged him to follow-up with [facility] gastro[facility] who is seen in the past.  He is discharged with return precautions.  Amount and/or Complexity of Data Reviewed Labs: ordered.  Risk OTC drugs. Prescription drug management.        Final diagnoses:  Epigastric burning sensation    ED Discharge Orders          Ordered    sucralfate  (CARAFATE ) 1 GM/10ML suspension  3 times daily with meals & bedtime        07/13/24 0902               Ula Prentice SAUNDERS, MD 07/13/24 [phone]

## 2024-07-21 DIAGNOSIS — Z5329 Procedure and treatment not carried out because of patient's decision for other reasons: Secondary | ICD-10-CM | POA: Diagnosis not present

## 2024-07-21 DIAGNOSIS — K5900 Constipation, unspecified: Secondary | ICD-10-CM | POA: Diagnosis not present

## 2024-07-21 DIAGNOSIS — R1013 Epigastric pain: Secondary | ICD-10-CM | POA: Diagnosis not present

## 2024-07-21 DIAGNOSIS — D649 Anemia, unspecified: Secondary | ICD-10-CM | POA: Diagnosis not present

## 2024-07-21 DIAGNOSIS — D75839 Thrombocytosis, unspecified: Secondary | ICD-10-CM | POA: Diagnosis not present

## 2024-07-21 DIAGNOSIS — D473 Essential (hemorrhagic) thrombocythemia: Secondary | ICD-10-CM | POA: Diagnosis not present

## 2024-07-21 DIAGNOSIS — R9431 Abnormal electrocardiogram [ECG] [EKG]: Secondary | ICD-10-CM | POA: Diagnosis not present

## 2024-07-21 DIAGNOSIS — F17210 Nicotine dependence, cigarettes, uncomplicated: Secondary | ICD-10-CM | POA: Diagnosis not present

## 2024-07-24 DIAGNOSIS — Z1330 Encounter for screening examination for mental health and behavioral disorders, unspecified: Secondary | ICD-10-CM | POA: Diagnosis not present

## 2024-07-24 DIAGNOSIS — R079 Chest pain, unspecified: Secondary | ICD-10-CM | POA: Diagnosis not present

## 2024-07-24 DIAGNOSIS — F1510 Other stimulant abuse, uncomplicated: Secondary | ICD-10-CM | POA: Diagnosis not present

## 2024-08-25 ENCOUNTER — Other Ambulatory Visit: Payer: Self-pay | Admitting: Family Medicine

## 2024-08-30 ENCOUNTER — Ambulatory Visit: Admitting: Gastroenterology

## 2024-12-02 ENCOUNTER — Encounter: Admitting: Family Medicine
